# Patient Record
Sex: Female | Born: 1965
Health system: Southern US, Community
[De-identification: ages and names within clinical notes are randomized; demographics above are authoritative.]

## PROBLEM LIST (undated history)

## (undated) DIAGNOSIS — J189 Pneumonia, unspecified organism: Secondary | ICD-10-CM

## (undated) DIAGNOSIS — F32A Depression, unspecified: Secondary | ICD-10-CM

## (undated) DIAGNOSIS — F419 Anxiety disorder, unspecified: Secondary | ICD-10-CM

## (undated) DIAGNOSIS — I509 Heart failure, unspecified: Secondary | ICD-10-CM

## (undated) DIAGNOSIS — I428 Other cardiomyopathies: Secondary | ICD-10-CM

## (undated) DIAGNOSIS — IMO0002 Reserved for concepts with insufficient information to code with codable children: Secondary | ICD-10-CM

## (undated) DIAGNOSIS — I674 Hypertensive encephalopathy: Secondary | ICD-10-CM

## (undated) DIAGNOSIS — Z87442 Personal history of urinary calculi: Secondary | ICD-10-CM

## (undated) DIAGNOSIS — M51369 Other intervertebral disc degeneration, lumbar region without mention of lumbar back pain or lower extremity pain: Secondary | ICD-10-CM

## (undated) DIAGNOSIS — M1990 Unspecified osteoarthritis, unspecified site: Secondary | ICD-10-CM

## (undated) DIAGNOSIS — R569 Unspecified convulsions: Secondary | ICD-10-CM

## (undated) DIAGNOSIS — M170 Bilateral primary osteoarthritis of knee: Secondary | ICD-10-CM

## (undated) DIAGNOSIS — R51 Headache: Secondary | ICD-10-CM

## (undated) DIAGNOSIS — M5136 Other intervertebral disc degeneration, lumbar region: Secondary | ICD-10-CM

## (undated) DIAGNOSIS — J9690 Respiratory failure, unspecified, unspecified whether with hypoxia or hypercapnia: Secondary | ICD-10-CM

## (undated) DIAGNOSIS — I10 Essential (primary) hypertension: Secondary | ICD-10-CM

## (undated) DIAGNOSIS — R0600 Dyspnea, unspecified: Secondary | ICD-10-CM

## (undated) DIAGNOSIS — F329 Major depressive disorder, single episode, unspecified: Secondary | ICD-10-CM

## (undated) DIAGNOSIS — M5126 Other intervertebral disc displacement, lumbar region: Secondary | ICD-10-CM

## (undated) DIAGNOSIS — R519 Headache, unspecified: Secondary | ICD-10-CM

## (undated) DIAGNOSIS — F319 Bipolar disorder, unspecified: Secondary | ICD-10-CM

## (undated) DIAGNOSIS — G8929 Other chronic pain: Secondary | ICD-10-CM

## (undated) DIAGNOSIS — M549 Dorsalgia, unspecified: Secondary | ICD-10-CM

## (undated) HISTORY — PX: TONSILLECTOMY: SUR1361

## (undated) HISTORY — PX: APPENDECTOMY: SHX54

## (undated) HISTORY — PX: JOINT REPLACEMENT: SHX530

## (undated) HISTORY — PX: ABDOMINAL HYSTERECTOMY: SHX81

## (undated) HISTORY — PX: CHOLECYSTECTOMY: SHX55

---

## 2009-07-12 ENCOUNTER — Encounter: Admission: RE | Admit: 2009-07-12 | Discharge: 2009-07-12 | Payer: Self-pay | Admitting: Neurosurgery

## 2009-12-29 ENCOUNTER — Ambulatory Visit: Payer: Self-pay | Admitting: Pulmonary Disease

## 2009-12-29 ENCOUNTER — Inpatient Hospital Stay (HOSPITAL_COMMUNITY): Admission: EM | Admit: 2009-12-29 | Discharge: 2010-01-07 | Payer: Self-pay | Admitting: Internal Medicine

## 2009-12-29 ENCOUNTER — Ambulatory Visit: Payer: Self-pay | Admitting: Cardiology

## 2009-12-30 ENCOUNTER — Encounter: Payer: Self-pay | Admitting: Internal Medicine

## 2009-12-30 ENCOUNTER — Encounter (INDEPENDENT_AMBULATORY_CARE_PROVIDER_SITE_OTHER): Payer: Self-pay | Admitting: Internal Medicine

## 2010-01-02 ENCOUNTER — Encounter: Payer: Self-pay | Admitting: Internal Medicine

## 2010-01-07 ENCOUNTER — Ambulatory Visit: Payer: Self-pay | Admitting: Psychiatry

## 2010-01-19 DIAGNOSIS — J811 Chronic pulmonary edema: Secondary | ICD-10-CM

## 2010-01-19 DIAGNOSIS — G936 Cerebral edema: Secondary | ICD-10-CM

## 2010-01-19 DIAGNOSIS — I428 Other cardiomyopathies: Secondary | ICD-10-CM | POA: Insufficient documentation

## 2010-01-19 DIAGNOSIS — N19 Unspecified kidney failure: Secondary | ICD-10-CM | POA: Insufficient documentation

## 2010-01-19 DIAGNOSIS — J9600 Acute respiratory failure, unspecified whether with hypoxia or hypercapnia: Secondary | ICD-10-CM

## 2010-01-19 HISTORY — DX: Chronic pulmonary edema: J81.1

## 2010-01-19 HISTORY — DX: Unspecified kidney failure: N19

## 2010-01-19 HISTORY — DX: Other cardiomyopathies: I42.8

## 2010-01-19 HISTORY — DX: Acute respiratory failure, unspecified whether with hypoxia or hypercapnia: J96.00

## 2010-01-19 HISTORY — DX: Cerebral edema: G93.6

## 2010-01-20 ENCOUNTER — Ambulatory Visit: Payer: Self-pay | Admitting: Internal Medicine

## 2010-01-23 ENCOUNTER — Telehealth: Payer: Self-pay | Admitting: Internal Medicine

## 2010-01-23 LAB — CONVERTED CEMR LAB
GFR calc non Af Amer: 63.56 mL/min (ref >60)
Sodium: 132 meq/L — ABNORMAL LOW (ref 135–145)

## 2010-02-06 ENCOUNTER — Telehealth: Payer: Self-pay | Admitting: Internal Medicine

## 2010-02-08 ENCOUNTER — Encounter: Payer: Self-pay | Admitting: Internal Medicine

## 2010-02-15 ENCOUNTER — Encounter: Payer: Self-pay | Admitting: Internal Medicine

## 2010-03-06 ENCOUNTER — Encounter: Payer: Self-pay | Admitting: Internal Medicine

## 2010-05-17 ENCOUNTER — Encounter: Payer: Self-pay | Admitting: Internal Medicine

## 2010-06-02 ENCOUNTER — Telehealth: Payer: Self-pay | Admitting: Internal Medicine

## 2010-06-05 ENCOUNTER — Telehealth: Payer: Self-pay | Admitting: Internal Medicine

## 2010-07-06 ENCOUNTER — Encounter (INDEPENDENT_AMBULATORY_CARE_PROVIDER_SITE_OTHER): Payer: Self-pay | Admitting: *Deleted

## 2010-11-21 NOTE — Progress Notes (Signed)
Summary: refill  Phone Note Refill Request Message from:  Patient on June 02, 2010 11:01 AM  Refills Requested: Medication #1:  COREG 3.125 MG TABS 1 half  tab 2 times per day. [facility]  [phone]  Initial call taken by: Judie Grieve,  June 02, 2010 11:01 AM    Prescriptions: COREG 3.125 MG TABS (CARVEDILOL) 1 half  tab 2 times per day  #30 x 6   Entered by:   Burnett Kanaris, CNA   Authorized by:   Sherrill Raring, MD, [facility]   Signed by:   Burnett Kanaris, CNA on 06/02/2010   Method used:   Electronically to        [facility] (retail)       [address]       [place], [place]  [address]       Ph: [phone]       Fax: [phone]   RxID:   [id number]

## 2010-11-21 NOTE — Miscellaneous (Signed)
Summary: [facility] Cardiac Referral Form  [facility] Cardiac Referral Form   Imported By: Roderic Ovens 03/10/2010 11:18:04  _____________________________________________________________________  External Attachment:    Type:   Image     Comment:   External Document

## 2010-11-21 NOTE — Assessment & Plan Note (Signed)
Summary: eph/jml   CC:  post hosp f/u.  History of Present Illness: patient is a 45 year old who was discharged from [facility] earlier this month. We initially saw her in consult for ST elevation in the anterior and lateral leads.  Trop was positive. She had been admitted with a drug overdose, was hypoxic, intubated. The patient had an echo that showed severe LV dysfunction, LVEF 15%.  She underwent urgent cardiac catheterization.  the coronary arteries were normal.  It was felt that her presentation was a form of Takotsubo's cardiomyopathy.  Echo before D/C showed near normalization of LVEF.  She was diuresed and set home on coreg and Lisinopril SInce d/c she denies chst pain, denies shortness of  breath.  Denies edema.  Problems Prior to Update: 1)  Cerebral Edema  (ICD-348.5) 2)  Renal Failure  (ICD-586) 3)  Pulmonary Edema  (ICD-514) 4)  Cardiomyopathy  (ICD-425.4) 5)  Respiratory Failure  (ICD-518.81)  Current Medications (verified): 1)  Roxicodone 15 Mg Tabs (Oxycodone Hcl) .... Take 1 By Mouth Up To Three Times A Day 2)  Cymbalta 60 Mg Cpep (Duloxetine Hcl) .... Take 1 By Mouth Two Times A Day 3)  Seroquel 200 Mg Tabs (Quetiapine Fumarate) .... Take 1 By Mouth At Bedtime 4)  Trazodone Hcl 150 Mg Tabs (Trazodone Hcl) .... Take 1 By Mouth At Bedtime 5)  Trileptal 300 Mg Tabs (Oxcarbazepine) .... Take 1 By Mouth Two Times A Day 6)  Alprazolam 1 Mg Tabs (Alprazolam) .... Take 1 By Mouth Three Times A Day 7)  Zestril 2.5 Mg Tabs (Lisinopril) .Marland Kitchen.. 1 Per Day 8)  Coreg 3.125 Mg Tabs (Carvedilol) .Marland Kitchen.. 1 Tab 2 Times Per Day  Allergies (verified): 1)  ! Morphine 2)  ! * Opana 3)  ! * Oxymorphone  Review of Systems       All systems reviewed.  Negative to the above problem  Vital Signs:  Patient profile:   45 year old female Height:      65 inches Weight:      239 pounds BMI:     39.92 Pulse rate:   76 / minute BP sitting:   94 / 56  (left arm) Cuff size:    large  Vitals Entered By: Stanton Kidney, EMT-P (January 20, 2010 12:43 PM)  Physical Exam  Additional Exam:  Patient is in NAD HEENT:  Normocephalic, atraumatic. EOMI, PERRLA.  Neck: JVP is normal. No thyromegaly. No bruits.  Lungs: clear to auscultation. No rales no wheezes.  Heart: Regular rate and rhythm. Normal S1, S2. No S3.   No significant murmurs. PMI not displaced.  Abdomen:  Supple, nontender. Normal bowel sounds. No masses. No hepatomegaly.  Extremities:   Good distal pulses throughout. No lower extremity edema.  Musculoskeletal :moving all extremities.  Neuro:   alert and oriented x3.    EKG  Procedure date:  01/20/2010  Findings:      NSR>  76 bpm.  T wave inveriosn V2, I, AVL  Impression & Recommendations:  Problem # 1:  CARDIOMYOPATHY (ICD-425.4) Patient with recent MI felt secondary to spasm (Takotsubo's CM).  Recovering well.  Probably inititated by hypoxia. I would keep her on the current regimen.  will check BMET.  F/u in august.  Problem # 2:  RESPIRATORY FAILURE (ICD-518.81) Resolved  Other Orders: EKG w/ Interpretation (93000) TLB-BMP (Basic Metabolic Panel-BMET) (80048-METABOL)  Patient Instructions: 1)  Your physician recommends that you return for lab work in: bmet today....we  will call you with results 2)  Your physician wants you to follow-up in: end of July  You will receive a reminder letter in the mail two months in advance. If you don't receive a letter, please call our office to schedule the follow-up appointment.

## 2010-11-21 NOTE — Miscellaneous (Signed)
Summary: [facility] Cardiac Physician Order/Treatment Plan  [facility] Cardiac Physician Order/Treatment Plan   Imported By: Roderic Ovens 02/27/2010 11:21:27  _____________________________________________________________________  External Attachment:    Type:   Image     Comment:   External Document

## 2010-11-21 NOTE — Progress Notes (Signed)
Summary: QUESTION ABOUT COREG  Phone Note Call from Patient Call back at [facility] Phone [phone]   Caller: Patient Summary of Call: PT HAVE QUESTION ABOUT HER COREG Initial call taken by: Judie Grieve,  June 05, 2010 9:59 AM  Follow-up for Phone Call        Called patient...she was not home .Marland KitchenMarland KitchenLM for call back. Follow-up by: Suzan Garibaldi RN  Additional Follow-up for Phone Call Additional follow up Details #1::        Called patient back...she states that she can't cut the Coreg 3.125mg  in half because the tablet crumbles even with a pill cutter. Advised her to just take the Coreg 3.12 mg 1 tab  every day which is what she has been doing the past few days and feels well. Additional Follow-up by: Suzan Garibaldi RN

## 2010-11-21 NOTE — Miscellaneous (Signed)
  Clinical Lists Changes  Medications: Changed medication from COREG 3.125 MG TABS (CARVEDILOL) 1 tab 2 times per day to COREG 3.125 MG TABS (CARVEDILOL) 1 half  tab 2 times per day

## 2010-11-21 NOTE — Progress Notes (Signed)
Summary: SOB/tired  Phone Note Call from Patient Call back at Home Phone [phone]   Caller: Patient Reason for Call: Talk to Nurse Summary of Call: started having SOB when walking, she thinks her meds are making her tired Initial call taken by: Migdalia Dk,  February 06, 2010 12:07 PM  Follow-up for Phone Call        02/05/10--pt states since starting lisinopril and coreg she becomes SOB when she walks uphill and she is tired all the time--advised to decrease exercise( no uphill) and continue to take meds and i will let dr Tenny Craw know when she is back in office to give her a call--nt Follow-up by: Ledon Snare, RN,  February 06, 2010 12:36 PM     Appended Document: SOB/tired Patient can cut coreg in 1/2.  Take 2 times per day. See if she can get into cardiac rehab.  She ruled in.  Had vasospasm.  Appended Document: SOB/tired Called patient...she is aware of above.

## 2010-11-21 NOTE — Letter (Signed)
Summary: Appointment - Missed  [facility], Main Office  [address]   [place], [place] [address]   Phone: [phone]  Fax: [phone]     July 06, 2010 MRN: [id number]   [facility] [address] [place], [place]  [address]   Dear Ms. Sanagustin,  Our records indicate you missed your appointment on                        with Dr.       .                                    It is very important that we reach you to reschedule this appointment. We look forward to participating in your health care needs. Please contact us at the number listed above at your earliest convenience to reschedule this appointment.     Sincerely,    Glass blower/designer

## 2010-11-21 NOTE — Consult Note (Signed)
Summary: [facility]  [facility]   Imported By: Marylou Mccoy 01/19/2010 17:24:03  _____________________________________________________________________  External Attachment:    Type:   Image     Comment:   External Document

## 2010-11-21 NOTE — Progress Notes (Signed)
Summary: Questions about medication  Phone Note Call from Patient Call back at Home Phone [phone]   Caller: Patient Summary of Call: Pt have questions about medication Initial call taken by: Judie Grieve,  January 23, 2010 1:57 PM  Follow-up for Phone Call        Called patient ...she is complaining of some fluid retention in hands and feet/ankles..she denies any SOB. Advised I will discuss with Dr.Kiora Hallberg and call her back. Follow-up by: Suzan Garibaldi RN  Additional Follow-up for Phone Call Additional follow up Details #1::        Not on any meds  to lead to swelling.  Started day before yesterday.  Drinks crystal light 2 (32 ounzes), milk, coke. No shortness of breath I told her to cut back on fluid to 1.5 l total.  Call at end of wk. Additional Follow-up by: Sherrill Raring, MD, [facility],  January 23, 2010 4:36 PM     Appended Document: Questions about medication [facility] for call back to check status.

## 2011-01-15 LAB — BASIC METABOLIC PANEL
BUN: 14 mg/dL (ref 6–23)
BUN: 17 mg/dL (ref 6–23)
BUN: 18 mg/dL (ref 6–23)
BUN: 20 mg/dL (ref 6–23)
BUN: 20 mg/dL (ref 6–23)
BUN: 21 mg/dL (ref 6–23)
BUN: 22 mg/dL (ref 6–23)
BUN: 27 mg/dL — ABNORMAL HIGH (ref 6–23)
CO2: 22 mEq/L (ref 19–32)
CO2: 22 mEq/L (ref 19–32)
CO2: 24 mEq/L (ref 19–32)
CO2: 24 mEq/L (ref 19–32)
CO2: 25 mEq/L (ref 19–32)
CO2: 26 mEq/L (ref 19–32)
CO2: 27 mEq/L (ref 19–32)
CO2: 27 mEq/L (ref 19–32)
CO2: 28 mEq/L (ref 19–32)
Calcium: 7.9 mg/dL — ABNORMAL LOW (ref 8.4–10.5)
Calcium: 7.9 mg/dL — ABNORMAL LOW (ref 8.4–10.5)
Calcium: 8.7 mg/dL (ref 8.4–10.5)
Calcium: 8.7 mg/dL (ref 8.4–10.5)
Calcium: 8.8 mg/dL (ref 8.4–10.5)
Calcium: 9 mg/dL (ref 8.4–10.5)
Chloride: 100 mEq/L (ref 96–112)
Chloride: 100 mEq/L (ref 96–112)
Chloride: 102 mEq/L (ref 96–112)
Chloride: 104 mEq/L (ref 96–112)
Chloride: 104 mEq/L (ref 96–112)
Chloride: 104 mEq/L (ref 96–112)
Chloride: 105 mEq/L (ref 96–112)
Chloride: 99 mEq/L (ref 96–112)
Creatinine, Ser: 0.83 mg/dL (ref 0.4–1.2)
Creatinine, Ser: 0.85 mg/dL (ref 0.4–1.2)
Creatinine, Ser: 0.89 mg/dL (ref 0.4–1.2)
Creatinine, Ser: 0.92 mg/dL (ref 0.4–1.2)
Creatinine, Ser: 1 mg/dL (ref 0.4–1.2)
Creatinine, Ser: 1.07 mg/dL (ref 0.4–1.2)
GFR calc Af Amer: 56 mL/min — ABNORMAL LOW (ref >60)
GFR calc Af Amer: >60 mL/min (ref >60)
GFR calc Af Amer: >60 mL/min (ref >60)
GFR calc Af Amer: >60 mL/min (ref >60)
GFR calc Af Amer: >60 mL/min (ref >60)
GFR calc non Af Amer: 55 mL/min — ABNORMAL LOW (ref >60)
GFR calc non Af Amer: >60 mL/min (ref >60)
GFR calc non Af Amer: >60 mL/min (ref >60)
Glucose, Bld: 106 mg/dL — ABNORMAL HIGH (ref 70–99)
Glucose, Bld: 120 mg/dL — ABNORMAL HIGH (ref 70–99)
Glucose, Bld: 134 mg/dL — ABNORMAL HIGH (ref 70–99)
Glucose, Bld: 159 mg/dL — ABNORMAL HIGH (ref 70–99)
Glucose, Bld: 226 mg/dL — ABNORMAL HIGH (ref 70–99)
Glucose, Bld: 99 mg/dL (ref 70–99)
Glucose, Bld: 99 mg/dL (ref 70–99)
Potassium: 3.5 mEq/L (ref 3.5–5.1)
Potassium: 3.5 mEq/L (ref 3.5–5.1)
Potassium: 3.5 mEq/L (ref 3.5–5.1)
Potassium: 3.6 mEq/L (ref 3.5–5.1)
Potassium: 3.6 mEq/L (ref 3.5–5.1)
Potassium: 3.6 mEq/L (ref 3.5–5.1)
Potassium: 3.7 mEq/L (ref 3.5–5.1)
Potassium: 4.6 mEq/L (ref 3.5–5.1)
Sodium: 133 mEq/L — ABNORMAL LOW (ref 135–145)
Sodium: 134 mEq/L — ABNORMAL LOW (ref 135–145)
Sodium: 139 mEq/L (ref 135–145)
Sodium: 139 mEq/L (ref 135–145)

## 2011-01-15 LAB — BLOOD GAS, ARTERIAL
Acid-Base Excess: 5.4 mmol/L — ABNORMAL HIGH (ref 0.0–2.0)
Acid-base deficit: 2.1 mmol/L — ABNORMAL HIGH (ref 0.0–2.0)
Bicarbonate: 20.6 mEq/L (ref 20.0–24.0)
Bicarbonate: 22.6 mEq/L (ref 20.0–24.0)
FIO2: 1 %
MECHVT: 350 mL
MECHVT: 350 mL
MECHVT: 350 mL
MECHVT: 350 mL
O2 Saturation: 79 %
PEEP: 5 cmH2O
PEEP: 5 cmH2O
Patient temperature: 98.6
Patient temperature: 98.6
Patient temperature: 99
RATE: 35 resp/min
RATE: 35 resp/min
RATE: 35 resp/min
TCO2: 21.6 mmol/L (ref 0–100)
TCO2: 23.9 mmol/L (ref 0–100)
TCO2: 26.6 mmol/L (ref 0–100)
pCO2 arterial: 36.7 mmHg (ref 35.0–45.0)
pCO2 arterial: 42 mmHg (ref 35.0–45.0)
pH, Arterial: 7.352 (ref 7.350–7.400)
pH, Arterial: 7.484 — ABNORMAL HIGH (ref 7.350–7.400)
pH, Arterial: 7.485 — ABNORMAL HIGH (ref 7.350–7.400)
pH, Arterial: 7.505 — ABNORMAL HIGH (ref 7.350–7.400)

## 2011-01-15 LAB — POCT I-STAT 3, ART BLOOD GAS (G3+)
Acid-Base Excess: 4 mmol/L — ABNORMAL HIGH (ref 0.0–2.0)
O2 Saturation: 97 %
TCO2: 32 mmol/L (ref 0–100)
pCO2 arterial: 51.4 mmHg — ABNORMAL HIGH (ref 35.0–45.0)
pH, Arterial: 7.38 (ref 7.350–7.400)
pO2, Arterial: 92 mmHg (ref 80.0–100.0)

## 2011-01-15 LAB — LEGIONELLA ANTIGEN, URINE: Legionella Antigen, Urine: NEGATIVE

## 2011-01-15 LAB — COMPREHENSIVE METABOLIC PANEL
AST: 27 U/L (ref 0–37)
Alkaline Phosphatase: 132 U/L — ABNORMAL HIGH (ref 39–117)
Calcium: 8.5 mg/dL (ref 8.4–10.5)
GFR calc Af Amer: >60 mL/min (ref >60)
GFR calc non Af Amer: 60 mL/min — ABNORMAL LOW (ref >60)
Glucose, Bld: 131 mg/dL — ABNORMAL HIGH (ref 70–99)
Total Bilirubin: 0.4 mg/dL (ref 0.3–1.2)

## 2011-01-15 LAB — CBC
HCT: 29.3 % — ABNORMAL LOW (ref 36.0–46.0)
HCT: 30 % — ABNORMAL LOW (ref 36.0–46.0)
HCT: 33.8 % — ABNORMAL LOW (ref 36.0–46.0)
HCT: 34.3 % — ABNORMAL LOW (ref 36.0–46.0)
HCT: 35 % — ABNORMAL LOW (ref 36.0–46.0)
HCT: 35.1 % — ABNORMAL LOW (ref 36.0–46.0)
HCT: 40.6 % (ref 36.0–46.0)
Hemoglobin: 10.1 g/dL — ABNORMAL LOW (ref 12.0–15.0)
Hemoglobin: 10.9 g/dL — ABNORMAL LOW (ref 12.0–15.0)
Hemoglobin: 11.9 g/dL — ABNORMAL LOW (ref 12.0–15.0)
Hemoglobin: 12.1 g/dL (ref 12.0–15.0)
Hemoglobin: 9 g/dL — ABNORMAL LOW (ref 12.0–15.0)
MCHC: 32.2 g/dL (ref 30.0–36.0)
MCHC: 34.2 g/dL (ref 30.0–36.0)
MCHC: 34.3 g/dL (ref 30.0–36.0)
MCHC: 34.5 g/dL (ref 30.0–36.0)
MCHC: 34.5 g/dL (ref 30.0–36.0)
MCHC: 34.5 g/dL (ref 30.0–36.0)
MCHC: 34.7 g/dL (ref 30.0–36.0)
MCHC: 34.9 g/dL (ref 30.0–36.0)
MCV: 88.5 fL (ref 78.0–100.0)
MCV: 89.2 fL (ref 78.0–100.0)
MCV: 89.3 fL (ref 78.0–100.0)
MCV: 89.4 fL (ref 78.0–100.0)
MCV: 89.6 fL (ref 78.0–100.0)
MCV: 89.7 fL (ref 78.0–100.0)
MCV: 89.8 fL (ref 78.0–100.0)
Platelets: 189 10*3/uL (ref 150–400)
Platelets: 203 10*3/uL (ref 150–400)
Platelets: 338 10*3/uL (ref 150–400)
Platelets: 352 10*3/uL (ref 150–400)
Platelets: 390 10*3/uL (ref 150–400)
Platelets: 395 10*3/uL (ref 150–400)
RBC: 2.68 MIL/uL — ABNORMAL LOW (ref 3.87–5.11)
RBC: 2.93 MIL/uL — ABNORMAL LOW (ref 3.87–5.11)
RBC: 2.96 MIL/uL — ABNORMAL LOW (ref 3.87–5.11)
RBC: 3.27 MIL/uL — ABNORMAL LOW (ref 3.87–5.11)
RBC: 3.84 MIL/uL — ABNORMAL LOW (ref 3.87–5.11)
RDW: 12.1 % (ref 11.5–15.5)
RDW: 12.4 % (ref 11.5–15.5)
RDW: 12.6 % (ref 11.5–15.5)
RDW: 12.6 % (ref 11.5–15.5)
RDW: 12.6 % (ref 11.5–15.5)
RDW: 12.7 % (ref 11.5–15.5)
RDW: 12.8 % (ref 11.5–15.5)
RDW: 12.8 % (ref 11.5–15.5)
WBC: 11.6 10*3/uL — ABNORMAL HIGH (ref 4.0–10.5)
WBC: 12.8 10*3/uL — ABNORMAL HIGH (ref 4.0–10.5)
WBC: 12.8 10*3/uL — ABNORMAL HIGH (ref 4.0–10.5)
WBC: 25.1 10*3/uL — ABNORMAL HIGH (ref 4.0–10.5)

## 2011-01-15 LAB — RAPID URINE DRUG SCREEN, HOSP PERFORMED
Benzodiazepines: POSITIVE — AB
Cocaine: NOT DETECTED
Tetrahydrocannabinol: NOT DETECTED

## 2011-01-15 LAB — DIFFERENTIAL
Basophils Absolute: 0 10*3/uL (ref 0.0–0.1)
Basophils Relative: 0 % (ref 0–1)
Basophils Relative: 0 % (ref 0–1)
Eosinophils Absolute: 0 10*3/uL (ref 0.0–0.7)
Eosinophils Absolute: 0.4 10*3/uL (ref 0.0–0.7)
Eosinophils Relative: 3 % (ref 0–5)
Lymphocytes Relative: 11 % — ABNORMAL LOW (ref 12–46)
Lymphs Abs: 1.1 10*3/uL (ref 0.7–4.0)
Lymphs Abs: 3.3 10*3/uL (ref 0.7–4.0)
Monocytes Relative: 9 % (ref 3–12)
Neutro Abs: 8.9 10*3/uL — ABNORMAL HIGH (ref 1.7–7.7)
Neutrophils Relative %: 62 % (ref 43–77)
Neutrophils Relative %: 86 % — ABNORMAL HIGH (ref 43–77)

## 2011-01-15 LAB — BRAIN NATRIURETIC PEPTIDE
Pro B Natriuretic peptide (BNP): 1489 pg/mL — ABNORMAL HIGH (ref 0.0–100.0)
Pro B Natriuretic peptide (BNP): 803 pg/mL — ABNORMAL HIGH (ref 0.0–100.0)

## 2011-01-15 LAB — URINALYSIS, MICROSCOPIC ONLY
Bilirubin Urine: NEGATIVE
Glucose, UA: 100 mg/dL — AB
Protein, ur: 30 mg/dL — AB
Urobilinogen, UA: 0.2 mg/dL (ref 0.0–1.0)

## 2011-01-15 LAB — GLUCOSE, CAPILLARY
Glucose-Capillary: 108 mg/dL — ABNORMAL HIGH (ref 70–99)
Glucose-Capillary: 119 mg/dL — ABNORMAL HIGH (ref 70–99)
Glucose-Capillary: 120 mg/dL — ABNORMAL HIGH (ref 70–99)
Glucose-Capillary: 121 mg/dL — ABNORMAL HIGH (ref 70–99)
Glucose-Capillary: 121 mg/dL — ABNORMAL HIGH (ref 70–99)
Glucose-Capillary: 124 mg/dL — ABNORMAL HIGH (ref 70–99)
Glucose-Capillary: 126 mg/dL — ABNORMAL HIGH (ref 70–99)
Glucose-Capillary: 129 mg/dL — ABNORMAL HIGH (ref 70–99)
Glucose-Capillary: 129 mg/dL — ABNORMAL HIGH (ref 70–99)
Glucose-Capillary: 130 mg/dL — ABNORMAL HIGH (ref 70–99)
Glucose-Capillary: 132 mg/dL — ABNORMAL HIGH (ref 70–99)
Glucose-Capillary: 148 mg/dL — ABNORMAL HIGH (ref 70–99)
Glucose-Capillary: 163 mg/dL — ABNORMAL HIGH (ref 70–99)

## 2011-01-15 LAB — URINE CULTURE
Colony Count: NO GROWTH
Culture: NO GROWTH

## 2011-01-15 LAB — CULTURE, BAL-QUANTITATIVE W GRAM STAIN

## 2011-01-15 LAB — PHOSPHORUS
Phosphorus: 2.2 mg/dL — ABNORMAL LOW (ref 2.3–4.6)
Phosphorus: 3.9 mg/dL (ref 2.3–4.6)

## 2011-01-15 LAB — LACTIC ACID, PLASMA
Lactic Acid, Venous: 0.9 mmol/L (ref 0.5–2.2)
Lactic Acid, Venous: 2.5 mmol/L — ABNORMAL HIGH (ref 0.5–2.2)

## 2011-01-15 LAB — CARDIAC PANEL(CRET KIN+CKTOT+MB+TROPI)
CK, MB: 11.1 ng/mL (ref 0.3–4.0)
CK, MB: 15.6 ng/mL (ref 0.3–4.0)
CK, MB: 8.5 ng/mL (ref 0.3–4.0)
Relative Index: 2.3 (ref 0.0–2.5)
Relative Index: 6.6 — ABNORMAL HIGH (ref 0.0–2.5)
Relative Index: 7.6 — ABNORMAL HIGH (ref 0.0–2.5)
Troponin I: 0.01 ng/mL (ref 0.00–0.06)
Troponin I: 1.64 ng/mL (ref 0.00–0.06)
Troponin I: 4.95 ng/mL (ref 0.00–0.06)

## 2011-01-15 LAB — MRSA PCR SCREENING
MRSA by PCR: NEGATIVE
MRSA by PCR: NEGATIVE

## 2011-01-15 LAB — HEPARIN LEVEL (UNFRACTIONATED)
Heparin Unfractionated: 0.23 IU/mL — ABNORMAL LOW (ref 0.30–0.70)
Heparin Unfractionated: 0.42 IU/mL (ref 0.30–0.70)
Heparin Unfractionated: 0.5 IU/mL (ref 0.30–0.70)

## 2011-01-15 LAB — PROTIME-INR
INR: 1.08 (ref 0.00–1.49)
Prothrombin Time: 13.9 seconds (ref 11.6–15.2)

## 2011-01-15 LAB — CULTURE, BLOOD (ROUTINE X 2)

## 2011-01-15 LAB — APTT: aPTT: 23 seconds — ABNORMAL LOW (ref 24–37)

## 2011-01-15 LAB — MAGNESIUM
Magnesium: 1.9 mg/dL (ref 1.5–2.5)
Magnesium: 2.8 mg/dL — ABNORMAL HIGH (ref 1.5–2.5)

## 2011-01-15 LAB — TSH: TSH: 0.647 u[IU]/mL (ref 0.350–4.500)

## 2011-01-15 LAB — CK TOTAL AND CKMB (NOT AT ARMC): Relative Index: 1.9 (ref 0.0–2.5)

## 2011-01-15 LAB — CARBOXYHEMOGLOBIN: Methemoglobin: 0.8 % (ref 0.0–1.5)

## 2011-01-15 LAB — STREP PNEUMONIAE URINARY ANTIGEN: Strep Pneumo Urinary Antigen: NEGATIVE

## 2011-11-08 DIAGNOSIS — J040 Acute laryngitis: Secondary | ICD-10-CM | POA: Diagnosis not present

## 2011-11-08 DIAGNOSIS — J209 Acute bronchitis, unspecified: Secondary | ICD-10-CM | POA: Diagnosis not present

## 2011-11-21 DIAGNOSIS — R5381 Other malaise: Secondary | ICD-10-CM | POA: Diagnosis not present

## 2011-11-21 DIAGNOSIS — F319 Bipolar disorder, unspecified: Secondary | ICD-10-CM | POA: Diagnosis not present

## 2011-11-22 DIAGNOSIS — F4310 Post-traumatic stress disorder, unspecified: Secondary | ICD-10-CM | POA: Diagnosis present

## 2011-11-22 DIAGNOSIS — I509 Heart failure, unspecified: Secondary | ICD-10-CM | POA: Diagnosis not present

## 2011-11-22 DIAGNOSIS — R45851 Suicidal ideations: Secondary | ICD-10-CM | POA: Diagnosis not present

## 2011-11-22 DIAGNOSIS — F3160 Bipolar disorder, current episode mixed, unspecified: Secondary | ICD-10-CM | POA: Diagnosis not present

## 2011-11-22 DIAGNOSIS — M5137 Other intervertebral disc degeneration, lumbosacral region: Secondary | ICD-10-CM | POA: Diagnosis not present

## 2011-11-22 DIAGNOSIS — IMO0002 Reserved for concepts with insufficient information to code with codable children: Secondary | ICD-10-CM | POA: Diagnosis not present

## 2011-11-22 DIAGNOSIS — Z8673 Personal history of transient ischemic attack (TIA), and cerebral infarction without residual deficits: Secondary | ICD-10-CM | POA: Diagnosis not present

## 2011-11-22 DIAGNOSIS — F319 Bipolar disorder, unspecified: Secondary | ICD-10-CM | POA: Diagnosis not present

## 2011-11-22 DIAGNOSIS — F411 Generalized anxiety disorder: Secondary | ICD-10-CM | POA: Diagnosis not present

## 2011-11-22 DIAGNOSIS — M1990 Unspecified osteoarthritis, unspecified site: Secondary | ICD-10-CM | POA: Diagnosis present

## 2011-11-22 DIAGNOSIS — F3189 Other bipolar disorder: Secondary | ICD-10-CM | POA: Diagnosis present

## 2011-11-22 DIAGNOSIS — I10 Essential (primary) hypertension: Secondary | ICD-10-CM | POA: Diagnosis not present

## 2011-12-20 DIAGNOSIS — M5030 Other cervical disc degeneration, unspecified cervical region: Secondary | ICD-10-CM | POA: Diagnosis not present

## 2011-12-20 DIAGNOSIS — M5137 Other intervertebral disc degeneration, lumbosacral region: Secondary | ICD-10-CM | POA: Diagnosis not present

## 2012-01-24 DIAGNOSIS — F3164 Bipolar disorder, current episode mixed, severe, with psychotic features: Secondary | ICD-10-CM | POA: Diagnosis not present

## 2012-02-20 DIAGNOSIS — D649 Anemia, unspecified: Secondary | ICD-10-CM | POA: Diagnosis not present

## 2012-02-20 DIAGNOSIS — E119 Type 2 diabetes mellitus without complications: Secondary | ICD-10-CM | POA: Diagnosis not present

## 2012-02-20 DIAGNOSIS — R5383 Other fatigue: Secondary | ICD-10-CM | POA: Diagnosis not present

## 2012-02-20 DIAGNOSIS — R5381 Other malaise: Secondary | ICD-10-CM | POA: Diagnosis not present

## 2012-02-20 DIAGNOSIS — I509 Heart failure, unspecified: Secondary | ICD-10-CM | POA: Diagnosis not present

## 2012-03-10 DIAGNOSIS — G894 Chronic pain syndrome: Secondary | ICD-10-CM | POA: Diagnosis not present

## 2012-03-10 DIAGNOSIS — M5137 Other intervertebral disc degeneration, lumbosacral region: Secondary | ICD-10-CM | POA: Diagnosis not present

## 2012-03-26 DIAGNOSIS — M5137 Other intervertebral disc degeneration, lumbosacral region: Secondary | ICD-10-CM | POA: Diagnosis not present

## 2012-03-26 DIAGNOSIS — M25519 Pain in unspecified shoulder: Secondary | ICD-10-CM | POA: Diagnosis not present

## 2012-04-02 DIAGNOSIS — Z79899 Other long term (current) drug therapy: Secondary | ICD-10-CM | POA: Diagnosis not present

## 2012-04-02 DIAGNOSIS — J209 Acute bronchitis, unspecified: Secondary | ICD-10-CM | POA: Diagnosis not present

## 2012-04-02 DIAGNOSIS — I10 Essential (primary) hypertension: Secondary | ICD-10-CM | POA: Diagnosis not present

## 2012-04-03 DIAGNOSIS — F3164 Bipolar disorder, current episode mixed, severe, with psychotic features: Secondary | ICD-10-CM | POA: Diagnosis not present

## 2012-04-09 DIAGNOSIS — M5137 Other intervertebral disc degeneration, lumbosacral region: Secondary | ICD-10-CM | POA: Diagnosis not present

## 2012-04-09 DIAGNOSIS — IMO0002 Reserved for concepts with insufficient information to code with codable children: Secondary | ICD-10-CM | POA: Diagnosis not present

## 2012-04-15 DIAGNOSIS — R05 Cough: Secondary | ICD-10-CM | POA: Diagnosis not present

## 2012-04-15 DIAGNOSIS — R059 Cough, unspecified: Secondary | ICD-10-CM | POA: Diagnosis not present

## 2012-04-15 DIAGNOSIS — M25519 Pain in unspecified shoulder: Secondary | ICD-10-CM | POA: Diagnosis not present

## 2012-04-15 DIAGNOSIS — R5383 Other fatigue: Secondary | ICD-10-CM | POA: Diagnosis not present

## 2012-05-08 DIAGNOSIS — F603 Borderline personality disorder: Secondary | ICD-10-CM | POA: Diagnosis not present

## 2012-05-08 DIAGNOSIS — I10 Essential (primary) hypertension: Secondary | ICD-10-CM | POA: Diagnosis not present

## 2012-05-08 DIAGNOSIS — IMO0002 Reserved for concepts with insufficient information to code with codable children: Secondary | ICD-10-CM | POA: Diagnosis not present

## 2012-05-08 DIAGNOSIS — R45851 Suicidal ideations: Secondary | ICD-10-CM | POA: Diagnosis not present

## 2012-05-08 DIAGNOSIS — F329 Major depressive disorder, single episode, unspecified: Secondary | ICD-10-CM | POA: Diagnosis not present

## 2012-05-08 DIAGNOSIS — F3189 Other bipolar disorder: Secondary | ICD-10-CM | POA: Diagnosis not present

## 2012-05-08 DIAGNOSIS — F3289 Other specified depressive episodes: Secondary | ICD-10-CM | POA: Diagnosis not present

## 2012-05-16 DIAGNOSIS — F3189 Other bipolar disorder: Secondary | ICD-10-CM | POA: Diagnosis not present

## 2012-05-19 DIAGNOSIS — F29 Unspecified psychosis not due to a substance or known physiological condition: Secondary | ICD-10-CM | POA: Diagnosis not present

## 2012-05-19 DIAGNOSIS — F329 Major depressive disorder, single episode, unspecified: Secondary | ICD-10-CM | POA: Diagnosis not present

## 2012-05-19 DIAGNOSIS — T424X4A Poisoning by benzodiazepines, undetermined, initial encounter: Secondary | ICD-10-CM | POA: Diagnosis not present

## 2012-05-19 DIAGNOSIS — R45851 Suicidal ideations: Secondary | ICD-10-CM | POA: Diagnosis not present

## 2012-05-19 DIAGNOSIS — F332 Major depressive disorder, recurrent severe without psychotic features: Secondary | ICD-10-CM | POA: Diagnosis not present

## 2012-05-23 DIAGNOSIS — M25569 Pain in unspecified knee: Secondary | ICD-10-CM | POA: Diagnosis not present

## 2012-05-23 DIAGNOSIS — M545 Low back pain: Secondary | ICD-10-CM | POA: Diagnosis not present

## 2012-05-23 DIAGNOSIS — M5137 Other intervertebral disc degeneration, lumbosacral region: Secondary | ICD-10-CM | POA: Diagnosis not present

## 2012-05-23 DIAGNOSIS — G894 Chronic pain syndrome: Secondary | ICD-10-CM | POA: Diagnosis not present

## 2012-05-23 DIAGNOSIS — Z5181 Encounter for therapeutic drug level monitoring: Secondary | ICD-10-CM | POA: Diagnosis not present

## 2012-05-23 DIAGNOSIS — Z79899 Other long term (current) drug therapy: Secondary | ICD-10-CM | POA: Diagnosis not present

## 2012-05-27 DIAGNOSIS — R05 Cough: Secondary | ICD-10-CM | POA: Diagnosis not present

## 2012-05-29 DIAGNOSIS — F3164 Bipolar disorder, current episode mixed, severe, with psychotic features: Secondary | ICD-10-CM | POA: Diagnosis not present

## 2012-06-02 DIAGNOSIS — R42 Dizziness and giddiness: Secondary | ICD-10-CM | POA: Diagnosis not present

## 2012-06-02 DIAGNOSIS — G8929 Other chronic pain: Secondary | ICD-10-CM | POA: Diagnosis not present

## 2012-06-02 DIAGNOSIS — J309 Allergic rhinitis, unspecified: Secondary | ICD-10-CM | POA: Diagnosis not present

## 2012-06-02 DIAGNOSIS — R233 Spontaneous ecchymoses: Secondary | ICD-10-CM | POA: Diagnosis not present

## 2012-07-06 DIAGNOSIS — A499 Bacterial infection, unspecified: Secondary | ICD-10-CM | POA: Diagnosis not present

## 2012-07-06 DIAGNOSIS — R109 Unspecified abdominal pain: Secondary | ICD-10-CM | POA: Diagnosis not present

## 2012-07-06 DIAGNOSIS — I509 Heart failure, unspecified: Secondary | ICD-10-CM | POA: Diagnosis not present

## 2012-07-06 DIAGNOSIS — N390 Urinary tract infection, site not specified: Secondary | ICD-10-CM | POA: Diagnosis not present

## 2012-07-06 DIAGNOSIS — R300 Dysuria: Secondary | ICD-10-CM | POA: Diagnosis not present

## 2012-07-06 DIAGNOSIS — I10 Essential (primary) hypertension: Secondary | ICD-10-CM | POA: Diagnosis not present

## 2012-07-14 DIAGNOSIS — J029 Acute pharyngitis, unspecified: Secondary | ICD-10-CM | POA: Diagnosis not present

## 2012-07-14 DIAGNOSIS — R51 Headache: Secondary | ICD-10-CM | POA: Diagnosis not present

## 2012-07-14 DIAGNOSIS — R220 Localized swelling, mass and lump, head: Secondary | ICD-10-CM | POA: Diagnosis not present

## 2012-07-17 DIAGNOSIS — R51 Headache: Secondary | ICD-10-CM | POA: Diagnosis not present

## 2012-07-17 DIAGNOSIS — K115 Sialolithiasis: Secondary | ICD-10-CM | POA: Diagnosis not present

## 2012-07-23 DIAGNOSIS — M5126 Other intervertebral disc displacement, lumbar region: Secondary | ICD-10-CM | POA: Diagnosis not present

## 2012-07-23 DIAGNOSIS — M47817 Spondylosis without myelopathy or radiculopathy, lumbosacral region: Secondary | ICD-10-CM | POA: Diagnosis not present

## 2012-07-23 DIAGNOSIS — G8929 Other chronic pain: Secondary | ICD-10-CM | POA: Diagnosis not present

## 2012-07-23 DIAGNOSIS — IMO0002 Reserved for concepts with insufficient information to code with codable children: Secondary | ICD-10-CM | POA: Diagnosis not present

## 2012-07-23 DIAGNOSIS — Z79899 Other long term (current) drug therapy: Secondary | ICD-10-CM | POA: Diagnosis not present

## 2012-07-23 DIAGNOSIS — M5137 Other intervertebral disc degeneration, lumbosacral region: Secondary | ICD-10-CM | POA: Diagnosis not present

## 2012-07-31 DIAGNOSIS — M47817 Spondylosis without myelopathy or radiculopathy, lumbosacral region: Secondary | ICD-10-CM | POA: Diagnosis not present

## 2012-07-31 DIAGNOSIS — M5137 Other intervertebral disc degeneration, lumbosacral region: Secondary | ICD-10-CM | POA: Diagnosis not present

## 2012-07-31 DIAGNOSIS — M545 Low back pain, unspecified: Secondary | ICD-10-CM | POA: Diagnosis not present

## 2012-07-31 DIAGNOSIS — IMO0002 Reserved for concepts with insufficient information to code with codable children: Secondary | ICD-10-CM | POA: Diagnosis not present

## 2012-08-05 DIAGNOSIS — M5137 Other intervertebral disc degeneration, lumbosacral region: Secondary | ICD-10-CM | POA: Diagnosis not present

## 2012-08-05 DIAGNOSIS — IMO0002 Reserved for concepts with insufficient information to code with codable children: Secondary | ICD-10-CM | POA: Diagnosis not present

## 2012-08-05 DIAGNOSIS — Z79899 Other long term (current) drug therapy: Secondary | ICD-10-CM | POA: Diagnosis not present

## 2012-08-05 DIAGNOSIS — M47817 Spondylosis without myelopathy or radiculopathy, lumbosacral region: Secondary | ICD-10-CM | POA: Diagnosis not present

## 2012-08-05 DIAGNOSIS — G894 Chronic pain syndrome: Secondary | ICD-10-CM | POA: Diagnosis not present

## 2012-08-14 DIAGNOSIS — F3164 Bipolar disorder, current episode mixed, severe, with psychotic features: Secondary | ICD-10-CM | POA: Diagnosis not present

## 2012-08-19 DIAGNOSIS — M47817 Spondylosis without myelopathy or radiculopathy, lumbosacral region: Secondary | ICD-10-CM | POA: Diagnosis not present

## 2012-08-19 DIAGNOSIS — G894 Chronic pain syndrome: Secondary | ICD-10-CM | POA: Diagnosis not present

## 2012-08-19 DIAGNOSIS — M5137 Other intervertebral disc degeneration, lumbosacral region: Secondary | ICD-10-CM | POA: Diagnosis not present

## 2012-08-19 DIAGNOSIS — IMO0002 Reserved for concepts with insufficient information to code with codable children: Secondary | ICD-10-CM | POA: Diagnosis not present

## 2012-08-19 DIAGNOSIS — Z79899 Other long term (current) drug therapy: Secondary | ICD-10-CM | POA: Diagnosis not present

## 2012-08-21 DIAGNOSIS — IMO0002 Reserved for concepts with insufficient information to code with codable children: Secondary | ICD-10-CM | POA: Diagnosis not present

## 2012-08-21 DIAGNOSIS — M79609 Pain in unspecified limb: Secondary | ICD-10-CM | POA: Diagnosis not present

## 2012-09-02 DIAGNOSIS — IMO0002 Reserved for concepts with insufficient information to code with codable children: Secondary | ICD-10-CM | POA: Diagnosis not present

## 2012-09-02 DIAGNOSIS — M5137 Other intervertebral disc degeneration, lumbosacral region: Secondary | ICD-10-CM | POA: Diagnosis not present

## 2012-09-15 DIAGNOSIS — R7402 Elevation of levels of lactic acid dehydrogenase (LDH): Secondary | ICD-10-CM | POA: Diagnosis not present

## 2012-09-15 DIAGNOSIS — R7401 Elevation of levels of liver transaminase levels: Secondary | ICD-10-CM | POA: Diagnosis not present

## 2012-09-15 DIAGNOSIS — R109 Unspecified abdominal pain: Secondary | ICD-10-CM | POA: Diagnosis not present

## 2012-09-15 DIAGNOSIS — R7301 Impaired fasting glucose: Secondary | ICD-10-CM | POA: Diagnosis not present

## 2012-09-15 DIAGNOSIS — R197 Diarrhea, unspecified: Secondary | ICD-10-CM | POA: Diagnosis not present

## 2012-09-17 DIAGNOSIS — G894 Chronic pain syndrome: Secondary | ICD-10-CM | POA: Diagnosis not present

## 2012-09-17 DIAGNOSIS — Z79899 Other long term (current) drug therapy: Secondary | ICD-10-CM | POA: Diagnosis not present

## 2012-09-17 DIAGNOSIS — IMO0002 Reserved for concepts with insufficient information to code with codable children: Secondary | ICD-10-CM | POA: Diagnosis not present

## 2012-09-17 DIAGNOSIS — M5137 Other intervertebral disc degeneration, lumbosacral region: Secondary | ICD-10-CM | POA: Diagnosis not present

## 2012-09-23 DIAGNOSIS — R109 Unspecified abdominal pain: Secondary | ICD-10-CM | POA: Diagnosis not present

## 2012-09-23 DIAGNOSIS — R1032 Left lower quadrant pain: Secondary | ICD-10-CM | POA: Diagnosis not present

## 2012-09-23 DIAGNOSIS — R635 Abnormal weight gain: Secondary | ICD-10-CM | POA: Diagnosis not present

## 2012-09-25 DIAGNOSIS — G894 Chronic pain syndrome: Secondary | ICD-10-CM | POA: Diagnosis not present

## 2012-09-25 DIAGNOSIS — Z79899 Other long term (current) drug therapy: Secondary | ICD-10-CM | POA: Diagnosis not present

## 2012-09-25 DIAGNOSIS — IMO0002 Reserved for concepts with insufficient information to code with codable children: Secondary | ICD-10-CM | POA: Diagnosis not present

## 2012-09-25 DIAGNOSIS — M5137 Other intervertebral disc degeneration, lumbosacral region: Secondary | ICD-10-CM | POA: Diagnosis not present

## 2012-09-30 DIAGNOSIS — Z79899 Other long term (current) drug therapy: Secondary | ICD-10-CM | POA: Diagnosis not present

## 2012-09-30 DIAGNOSIS — M5137 Other intervertebral disc degeneration, lumbosacral region: Secondary | ICD-10-CM | POA: Diagnosis not present

## 2012-10-08 DIAGNOSIS — Z79899 Other long term (current) drug therapy: Secondary | ICD-10-CM | POA: Diagnosis not present

## 2012-10-08 DIAGNOSIS — Z5181 Encounter for therapeutic drug level monitoring: Secondary | ICD-10-CM | POA: Diagnosis not present

## 2012-10-08 DIAGNOSIS — F315 Bipolar disorder, current episode depressed, severe, with psychotic features: Secondary | ICD-10-CM | POA: Diagnosis not present

## 2012-10-08 DIAGNOSIS — F3164 Bipolar disorder, current episode mixed, severe, with psychotic features: Secondary | ICD-10-CM | POA: Diagnosis not present

## 2012-10-08 DIAGNOSIS — F3160 Bipolar disorder, current episode mixed, unspecified: Secondary | ICD-10-CM | POA: Diagnosis not present

## 2012-10-08 DIAGNOSIS — Z1339 Encounter for screening examination for other mental health and behavioral disorders: Secondary | ICD-10-CM | POA: Diagnosis not present

## 2012-10-23 DIAGNOSIS — M1990 Unspecified osteoarthritis, unspecified site: Secondary | ICD-10-CM | POA: Diagnosis not present

## 2012-10-23 DIAGNOSIS — G894 Chronic pain syndrome: Secondary | ICD-10-CM | POA: Diagnosis not present

## 2012-10-23 DIAGNOSIS — Z79899 Other long term (current) drug therapy: Secondary | ICD-10-CM | POA: Diagnosis not present

## 2012-10-23 DIAGNOSIS — M5137 Other intervertebral disc degeneration, lumbosacral region: Secondary | ICD-10-CM | POA: Diagnosis not present

## 2012-10-30 DIAGNOSIS — M5137 Other intervertebral disc degeneration, lumbosacral region: Secondary | ICD-10-CM | POA: Diagnosis not present

## 2012-11-05 DIAGNOSIS — M5137 Other intervertebral disc degeneration, lumbosacral region: Secondary | ICD-10-CM | POA: Diagnosis not present

## 2012-11-05 DIAGNOSIS — G894 Chronic pain syndrome: Secondary | ICD-10-CM | POA: Diagnosis not present

## 2012-11-05 DIAGNOSIS — Z79899 Other long term (current) drug therapy: Secondary | ICD-10-CM | POA: Diagnosis not present

## 2012-11-05 DIAGNOSIS — IMO0001 Reserved for inherently not codable concepts without codable children: Secondary | ICD-10-CM | POA: Diagnosis not present

## 2012-11-27 DIAGNOSIS — M5137 Other intervertebral disc degeneration, lumbosacral region: Secondary | ICD-10-CM | POA: Diagnosis not present

## 2012-12-02 DIAGNOSIS — Z79899 Other long term (current) drug therapy: Secondary | ICD-10-CM | POA: Diagnosis not present

## 2012-12-02 DIAGNOSIS — I509 Heart failure, unspecified: Secondary | ICD-10-CM | POA: Diagnosis not present

## 2012-12-02 DIAGNOSIS — R404 Transient alteration of awareness: Secondary | ICD-10-CM | POA: Diagnosis not present

## 2012-12-02 DIAGNOSIS — T40601A Poisoning by unspecified narcotics, accidental (unintentional), initial encounter: Secondary | ICD-10-CM | POA: Diagnosis not present

## 2012-12-02 DIAGNOSIS — G894 Chronic pain syndrome: Secondary | ICD-10-CM | POA: Diagnosis not present

## 2012-12-02 DIAGNOSIS — IMO0002 Reserved for concepts with insufficient information to code with codable children: Secondary | ICD-10-CM | POA: Diagnosis not present

## 2012-12-02 DIAGNOSIS — I10 Essential (primary) hypertension: Secondary | ICD-10-CM | POA: Diagnosis not present

## 2012-12-02 DIAGNOSIS — T400X1A Poisoning by opium, accidental (unintentional), initial encounter: Secondary | ICD-10-CM | POA: Diagnosis not present

## 2012-12-03 DIAGNOSIS — G894 Chronic pain syndrome: Secondary | ICD-10-CM | POA: Diagnosis not present

## 2012-12-03 DIAGNOSIS — Z79899 Other long term (current) drug therapy: Secondary | ICD-10-CM | POA: Diagnosis not present

## 2012-12-03 DIAGNOSIS — M5137 Other intervertebral disc degeneration, lumbosacral region: Secondary | ICD-10-CM | POA: Diagnosis not present

## 2012-12-03 DIAGNOSIS — M47817 Spondylosis without myelopathy or radiculopathy, lumbosacral region: Secondary | ICD-10-CM | POA: Diagnosis not present

## 2012-12-05 DIAGNOSIS — I509 Heart failure, unspecified: Secondary | ICD-10-CM | POA: Diagnosis not present

## 2012-12-05 DIAGNOSIS — K219 Gastro-esophageal reflux disease without esophagitis: Secondary | ICD-10-CM | POA: Diagnosis not present

## 2012-12-05 DIAGNOSIS — R0602 Shortness of breath: Secondary | ICD-10-CM | POA: Diagnosis not present

## 2012-12-05 DIAGNOSIS — R0789 Other chest pain: Secondary | ICD-10-CM | POA: Diagnosis not present

## 2012-12-05 DIAGNOSIS — I10 Essential (primary) hypertension: Secondary | ICD-10-CM | POA: Diagnosis not present

## 2012-12-05 DIAGNOSIS — R072 Precordial pain: Secondary | ICD-10-CM | POA: Diagnosis not present

## 2012-12-17 DIAGNOSIS — F3164 Bipolar disorder, current episode mixed, severe, with psychotic features: Secondary | ICD-10-CM | POA: Diagnosis not present

## 2012-12-31 DIAGNOSIS — K219 Gastro-esophageal reflux disease without esophagitis: Secondary | ICD-10-CM | POA: Diagnosis not present

## 2012-12-31 DIAGNOSIS — X58XXXA Exposure to other specified factors, initial encounter: Secondary | ICD-10-CM | POA: Diagnosis not present

## 2012-12-31 DIAGNOSIS — I10 Essential (primary) hypertension: Secondary | ICD-10-CM | POA: Diagnosis not present

## 2012-12-31 DIAGNOSIS — R109 Unspecified abdominal pain: Secondary | ICD-10-CM | POA: Diagnosis not present

## 2012-12-31 DIAGNOSIS — S2249XA Multiple fractures of ribs, unspecified side, initial encounter for closed fracture: Secondary | ICD-10-CM | POA: Diagnosis not present

## 2012-12-31 DIAGNOSIS — R112 Nausea with vomiting, unspecified: Secondary | ICD-10-CM | POA: Diagnosis not present

## 2012-12-31 DIAGNOSIS — R079 Chest pain, unspecified: Secondary | ICD-10-CM | POA: Diagnosis not present

## 2012-12-31 DIAGNOSIS — I509 Heart failure, unspecified: Secondary | ICD-10-CM | POA: Diagnosis not present

## 2013-01-01 DIAGNOSIS — Z79899 Other long term (current) drug therapy: Secondary | ICD-10-CM | POA: Diagnosis not present

## 2013-01-01 DIAGNOSIS — M5137 Other intervertebral disc degeneration, lumbosacral region: Secondary | ICD-10-CM | POA: Diagnosis not present

## 2013-01-01 DIAGNOSIS — G894 Chronic pain syndrome: Secondary | ICD-10-CM | POA: Diagnosis not present

## 2013-01-01 DIAGNOSIS — M47817 Spondylosis without myelopathy or radiculopathy, lumbosacral region: Secondary | ICD-10-CM | POA: Diagnosis not present

## 2013-01-08 DIAGNOSIS — I10 Essential (primary) hypertension: Secondary | ICD-10-CM | POA: Diagnosis not present

## 2013-01-08 DIAGNOSIS — J398 Other specified diseases of upper respiratory tract: Secondary | ICD-10-CM | POA: Diagnosis not present

## 2013-01-08 DIAGNOSIS — Z006 Encounter for examination for normal comparison and control in clinical research program: Secondary | ICD-10-CM | POA: Diagnosis not present

## 2013-01-08 DIAGNOSIS — J029 Acute pharyngitis, unspecified: Secondary | ICD-10-CM | POA: Diagnosis not present

## 2013-01-08 DIAGNOSIS — Z8781 Personal history of (healed) traumatic fracture: Secondary | ICD-10-CM | POA: Diagnosis not present

## 2013-01-08 DIAGNOSIS — R05 Cough: Secondary | ICD-10-CM | POA: Diagnosis not present

## 2013-01-16 DIAGNOSIS — G894 Chronic pain syndrome: Secondary | ICD-10-CM | POA: Diagnosis not present

## 2013-01-16 DIAGNOSIS — Z79899 Other long term (current) drug therapy: Secondary | ICD-10-CM | POA: Diagnosis not present

## 2013-01-16 DIAGNOSIS — M5137 Other intervertebral disc degeneration, lumbosacral region: Secondary | ICD-10-CM | POA: Diagnosis not present

## 2013-01-16 DIAGNOSIS — M79609 Pain in unspecified limb: Secondary | ICD-10-CM | POA: Diagnosis not present

## 2013-01-26 DIAGNOSIS — H66009 Acute suppurative otitis media without spontaneous rupture of ear drum, unspecified ear: Secondary | ICD-10-CM | POA: Diagnosis not present

## 2013-01-26 DIAGNOSIS — J019 Acute sinusitis, unspecified: Secondary | ICD-10-CM | POA: Diagnosis not present

## 2013-01-26 DIAGNOSIS — R05 Cough: Secondary | ICD-10-CM | POA: Diagnosis not present

## 2013-01-29 DIAGNOSIS — Z79899 Other long term (current) drug therapy: Secondary | ICD-10-CM | POA: Diagnosis not present

## 2013-01-29 DIAGNOSIS — M5137 Other intervertebral disc degeneration, lumbosacral region: Secondary | ICD-10-CM | POA: Diagnosis not present

## 2013-01-29 DIAGNOSIS — M79609 Pain in unspecified limb: Secondary | ICD-10-CM | POA: Diagnosis not present

## 2013-01-29 DIAGNOSIS — G894 Chronic pain syndrome: Secondary | ICD-10-CM | POA: Diagnosis not present

## 2013-02-18 DIAGNOSIS — R079 Chest pain, unspecified: Secondary | ICD-10-CM | POA: Diagnosis not present

## 2013-02-18 DIAGNOSIS — I10 Essential (primary) hypertension: Secondary | ICD-10-CM | POA: Diagnosis not present

## 2013-02-18 DIAGNOSIS — R071 Chest pain on breathing: Secondary | ICD-10-CM | POA: Diagnosis not present

## 2013-02-18 DIAGNOSIS — Z79899 Other long term (current) drug therapy: Secondary | ICD-10-CM | POA: Diagnosis not present

## 2013-02-18 DIAGNOSIS — Z87891 Personal history of nicotine dependence: Secondary | ICD-10-CM | POA: Diagnosis not present

## 2013-02-18 DIAGNOSIS — M60009 Infective myositis, unspecified site: Secondary | ICD-10-CM | POA: Diagnosis not present

## 2013-02-23 DIAGNOSIS — R079 Chest pain, unspecified: Secondary | ICD-10-CM | POA: Diagnosis not present

## 2013-02-23 DIAGNOSIS — I10 Essential (primary) hypertension: Secondary | ICD-10-CM | POA: Diagnosis not present

## 2013-02-23 DIAGNOSIS — Z87891 Personal history of nicotine dependence: Secondary | ICD-10-CM | POA: Diagnosis not present

## 2013-02-23 DIAGNOSIS — I509 Heart failure, unspecified: Secondary | ICD-10-CM | POA: Diagnosis not present

## 2013-02-23 DIAGNOSIS — Z87440 Personal history of urinary (tract) infections: Secondary | ICD-10-CM | POA: Diagnosis not present

## 2013-02-23 DIAGNOSIS — Z90710 Acquired absence of both cervix and uterus: Secondary | ICD-10-CM | POA: Diagnosis not present

## 2013-02-23 DIAGNOSIS — K219 Gastro-esophageal reflux disease without esophagitis: Secondary | ICD-10-CM | POA: Diagnosis not present

## 2013-02-23 DIAGNOSIS — T1490XA Injury, unspecified, initial encounter: Secondary | ICD-10-CM | POA: Diagnosis not present

## 2013-02-23 DIAGNOSIS — S298XXA Other specified injuries of thorax, initial encounter: Secondary | ICD-10-CM | POA: Diagnosis not present

## 2013-02-23 DIAGNOSIS — Z79899 Other long term (current) drug therapy: Secondary | ICD-10-CM | POA: Diagnosis not present

## 2013-02-26 DIAGNOSIS — M5137 Other intervertebral disc degeneration, lumbosacral region: Secondary | ICD-10-CM | POA: Diagnosis not present

## 2013-02-26 DIAGNOSIS — M5380 Other specified dorsopathies, site unspecified: Secondary | ICD-10-CM | POA: Diagnosis not present

## 2013-02-26 DIAGNOSIS — Z79899 Other long term (current) drug therapy: Secondary | ICD-10-CM | POA: Diagnosis not present

## 2013-02-26 DIAGNOSIS — G894 Chronic pain syndrome: Secondary | ICD-10-CM | POA: Diagnosis not present

## 2013-03-08 DIAGNOSIS — M545 Low back pain, unspecified: Secondary | ICD-10-CM | POA: Diagnosis not present

## 2013-03-08 DIAGNOSIS — R319 Hematuria, unspecified: Secondary | ICD-10-CM | POA: Diagnosis not present

## 2013-03-08 DIAGNOSIS — S2239XA Fracture of one rib, unspecified side, initial encounter for closed fracture: Secondary | ICD-10-CM | POA: Diagnosis not present

## 2013-03-08 DIAGNOSIS — R82998 Other abnormal findings in urine: Secondary | ICD-10-CM | POA: Diagnosis not present

## 2013-03-08 DIAGNOSIS — I10 Essential (primary) hypertension: Secondary | ICD-10-CM | POA: Diagnosis not present

## 2013-03-08 DIAGNOSIS — N390 Urinary tract infection, site not specified: Secondary | ICD-10-CM | POA: Diagnosis not present

## 2013-03-08 DIAGNOSIS — Z79899 Other long term (current) drug therapy: Secondary | ICD-10-CM | POA: Diagnosis not present

## 2013-03-10 DIAGNOSIS — M1610 Unilateral primary osteoarthritis, unspecified hip: Secondary | ICD-10-CM | POA: Diagnosis not present

## 2013-03-25 DIAGNOSIS — M5137 Other intervertebral disc degeneration, lumbosacral region: Secondary | ICD-10-CM | POA: Diagnosis not present

## 2013-03-25 DIAGNOSIS — G894 Chronic pain syndrome: Secondary | ICD-10-CM | POA: Diagnosis not present

## 2013-03-25 DIAGNOSIS — IMO0001 Reserved for inherently not codable concepts without codable children: Secondary | ICD-10-CM | POA: Diagnosis not present

## 2013-03-25 DIAGNOSIS — Z79899 Other long term (current) drug therapy: Secondary | ICD-10-CM | POA: Diagnosis not present

## 2013-03-25 DIAGNOSIS — M169 Osteoarthritis of hip, unspecified: Secondary | ICD-10-CM | POA: Diagnosis not present

## 2013-03-30 DIAGNOSIS — Z79899 Other long term (current) drug therapy: Secondary | ICD-10-CM | POA: Diagnosis not present

## 2013-03-30 DIAGNOSIS — G894 Chronic pain syndrome: Secondary | ICD-10-CM | POA: Diagnosis not present

## 2013-04-06 ENCOUNTER — Encounter (HOSPITAL_COMMUNITY): Payer: Self-pay | Admitting: Emergency Medicine

## 2013-04-06 ENCOUNTER — Emergency Department (HOSPITAL_COMMUNITY)
Admission: EM | Admit: 2013-04-06 | Discharge: 2013-04-06 | Disposition: A | Discharge status: Home / Self Care | Payer: Medicare Other | Attending: Emergency Medicine | Admitting: Emergency Medicine

## 2013-04-06 DIAGNOSIS — I509 Heart failure, unspecified: Secondary | ICD-10-CM | POA: Insufficient documentation

## 2013-04-06 DIAGNOSIS — F1123 Opioid dependence with withdrawal: Secondary | ICD-10-CM

## 2013-04-06 DIAGNOSIS — G894 Chronic pain syndrome: Secondary | ICD-10-CM | POA: Diagnosis not present

## 2013-04-06 DIAGNOSIS — I10 Essential (primary) hypertension: Secondary | ICD-10-CM | POA: Insufficient documentation

## 2013-04-06 DIAGNOSIS — M25569 Pain in unspecified knee: Secondary | ICD-10-CM | POA: Insufficient documentation

## 2013-04-06 DIAGNOSIS — M47817 Spondylosis without myelopathy or radiculopathy, lumbosacral region: Secondary | ICD-10-CM | POA: Diagnosis not present

## 2013-04-06 DIAGNOSIS — F329 Major depressive disorder, single episode, unspecified: Secondary | ICD-10-CM | POA: Insufficient documentation

## 2013-04-06 DIAGNOSIS — M129 Arthropathy, unspecified: Secondary | ICD-10-CM | POA: Insufficient documentation

## 2013-04-06 DIAGNOSIS — M25552 Pain in left hip: Secondary | ICD-10-CM

## 2013-04-06 DIAGNOSIS — Z79899 Other long term (current) drug therapy: Secondary | ICD-10-CM | POA: Diagnosis not present

## 2013-04-06 DIAGNOSIS — M5137 Other intervertebral disc degeneration, lumbosacral region: Secondary | ICD-10-CM | POA: Diagnosis not present

## 2013-04-06 DIAGNOSIS — F3289 Other specified depressive episodes: Secondary | ICD-10-CM | POA: Insufficient documentation

## 2013-04-06 DIAGNOSIS — F19939 Other psychoactive substance use, unspecified with withdrawal, unspecified: Secondary | ICD-10-CM | POA: Insufficient documentation

## 2013-04-06 DIAGNOSIS — G8921 Chronic pain due to trauma: Secondary | ICD-10-CM | POA: Insufficient documentation

## 2013-04-06 HISTORY — DX: Unspecified osteoarthritis, unspecified site: M19.90

## 2013-04-06 HISTORY — DX: Heart failure, unspecified: I50.9

## 2013-04-06 HISTORY — DX: Essential (primary) hypertension: I10

## 2013-04-06 LAB — RAPID URINE DRUG SCREEN, HOSP PERFORMED
Barbiturates: NOT DETECTED
Benzodiazepines: NOT DETECTED

## 2013-04-06 MED ORDER — OXYCODONE-ACETAMINOPHEN 5-325 MG PO TABS: 2.0000 | ORAL_TABLET | ORAL | Status: DC | PRN

## 2013-04-06 MED ORDER — HYDROMORPHONE HCL PF 2 MG/ML IJ SOLN
2.0000 mg | Freq: Once | INTRAMUSCULAR | Status: AC
  Administered 2013-04-06: 2 mg via INTRAMUSCULAR
  Filled 2013-04-06: qty 1

## 2013-04-06 NOTE — ED Notes (Signed)
Dr. Delo at bedside. 

## 2013-04-06 NOTE — ED Provider Notes (Signed)
History     CSN: [id number]  Arrival date & time 04/06/13  1026   First MD Initiated Contact with Patient 04/06/13 1152      Chief Complaint  Patient presents with  . Pain  . Medical Clearance    (Consider location/radiation/quality/duration/timing/severity/associated sxs/prior treatment) HPI Comments: Patient with history of chronic hip pain due a "fracture" that she is due to see Dr. Charlann Boxer this Wednesday.  She tells me she was found to have high levels of benzodiazepenes and cocaine in her drug screen this morning.  The provider, Shawnee Knapp, at the pain clinic told her it was illegal for her to prescribe her more medicine due to this.  The patient tells me she was sent here for mental health evaluation as well as for pain control.  She denies to me that she is suicidal, homicidal, but admits that she is "depressed" due to the pain from her hip.  She denies to me having ever used cocaine, but does admit taking extra benzos to try to make the pain in her hip go away.  She has also used up all of her narcotic pain medications she was given to last until Wednesday.  The history is provided by the patient.    Past Medical History  Diagnosis Date  . Hypertension   . CHF (congestive heart failure)   . Arthritis     Past Surgical History  Procedure Laterality Date  . Cholecystectomy    . Appendectomy    . Abdominal hysterectomy      History reviewed. No pertinent family history.  History  Substance Use Topics  . Smoking status: Never Smoker   . Smokeless tobacco: Not on file  . Alcohol Use: No    OB History   Grav Para Term Preterm Abortions TAB SAB Ect Mult Living                  Review of Systems  All other systems reviewed and are negative.    Allergies  Morphine and Oxymorphone  Home Medications  No current outpatient prescriptions on file.  BP 130/84  Pulse 84  Temp(Src) 98.6 F (37 C) (Oral)  Resp 18  SpO2 96%  Physical Exam  Nursing note and  vitals reviewed. Constitutional: She is oriented to person, place, and time. She appears well-developed and well-nourished. No distress.  HENT:  Head: Normocephalic and atraumatic.  Neck: Normal range of motion. Neck supple.  Cardiovascular: Normal rate and regular rhythm.  Exam reveals no gallop and no friction rub.   No murmur heard. Pulmonary/Chest: Effort normal and breath sounds normal. No respiratory distress. She has no wheezes.  Abdominal: Soft. Bowel sounds are normal. She exhibits no distension. There is no tenderness.  Musculoskeletal: Normal range of motion.  She walks with a limp but is able to ambulate to the bathroom without difficulty.  Neurological: She is alert and oriented to person, place, and time.  Skin: Skin is warm and dry. She is not diaphoretic.    ED Course  Procedures (including critical care time)  Labs Reviewed - No data to display No results found.   No diagnosis found.    MDM  The patient presents here with chronic hip pain.  Pain management has refused to prescribe her more narcotics due to a positive drug test for cocaine.  She denies having used cocaine and the drug screen here is negative.  There seems to be a discrepancy between the drug test here and at the  clinic, and she has displayed a pattern of coming to the ED with painful conditions.  I have decided to give her the benefit of the doubt and have agreed to prescribe her a limited number of percocet that she can take between now and her appointment with Dr. Charlann Boxer.  I have also informed her that I will not do this again in the future.    She denies to me that she is suicidal or homicidal.  I have offered, but she declines to speak with anyone from crisis management.         Geoffery Lyons, MD 04/06/13 1340
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
## 2013-04-06 NOTE — ED Notes (Signed)
Pt arrives with home healthworker after pt reportedly failed urine drug screen at pain management clinic this AM. Per pt she took extra of her home medicines over the weekend to control pain for her broken hip. Pt ambulatory with cane, denies SI/ HI. Cooperative with care.

## 2013-04-06 NOTE — ED Notes (Signed)
Discharge instructions reviewed. Pt verbalized understanding.  

## 2013-04-06 NOTE — ED Notes (Addendum)
Pt presents to ED per Dr. Charlann Boxer request due to taking more pain medication than normal. Pt states she tested positive for benzodiazepines, and cocaine. Pt denies being suicidal and states she took the pain medication and more xanax than usual because of worsening pain. MD at pain clinic refused to treat her pain due to being positive for cocaine and was sent here for evaluation by ACT team.

## 2013-04-08 DIAGNOSIS — M25559 Pain in unspecified hip: Secondary | ICD-10-CM | POA: Diagnosis not present

## 2013-04-14 DIAGNOSIS — M25559 Pain in unspecified hip: Secondary | ICD-10-CM | POA: Diagnosis not present

## 2013-04-14 DIAGNOSIS — M1610 Unilateral primary osteoarthritis, unspecified hip: Secondary | ICD-10-CM | POA: Diagnosis not present

## 2013-04-20 DIAGNOSIS — M25559 Pain in unspecified hip: Secondary | ICD-10-CM | POA: Diagnosis not present

## 2013-04-20 DIAGNOSIS — G8929 Other chronic pain: Secondary | ICD-10-CM | POA: Diagnosis not present

## 2013-04-29 DIAGNOSIS — M25559 Pain in unspecified hip: Secondary | ICD-10-CM | POA: Diagnosis not present

## 2013-05-01 DIAGNOSIS — Z1339 Encounter for screening examination for other mental health and behavioral disorders: Secondary | ICD-10-CM | POA: Diagnosis not present

## 2013-05-01 DIAGNOSIS — Z79899 Other long term (current) drug therapy: Secondary | ICD-10-CM | POA: Diagnosis not present

## 2013-05-01 DIAGNOSIS — F3164 Bipolar disorder, current episode mixed, severe, with psychotic features: Secondary | ICD-10-CM | POA: Diagnosis not present

## 2013-05-01 DIAGNOSIS — Z5181 Encounter for therapeutic drug level monitoring: Secondary | ICD-10-CM | POA: Diagnosis not present

## 2013-05-03 DIAGNOSIS — M25519 Pain in unspecified shoulder: Secondary | ICD-10-CM | POA: Diagnosis not present

## 2013-05-03 DIAGNOSIS — M719 Bursopathy, unspecified: Secondary | ICD-10-CM | POA: Diagnosis not present

## 2013-05-03 DIAGNOSIS — M67919 Unspecified disorder of synovium and tendon, unspecified shoulder: Secondary | ICD-10-CM | POA: Diagnosis not present

## 2013-05-03 DIAGNOSIS — M7530 Calcific tendinitis of unspecified shoulder: Secondary | ICD-10-CM | POA: Diagnosis not present

## 2013-05-04 DIAGNOSIS — K219 Gastro-esophageal reflux disease without esophagitis: Secondary | ICD-10-CM | POA: Diagnosis not present

## 2013-05-04 DIAGNOSIS — M129 Arthropathy, unspecified: Secondary | ICD-10-CM | POA: Diagnosis not present

## 2013-05-04 DIAGNOSIS — I10 Essential (primary) hypertension: Secondary | ICD-10-CM | POA: Diagnosis not present

## 2013-05-04 DIAGNOSIS — M25559 Pain in unspecified hip: Secondary | ICD-10-CM | POA: Diagnosis not present

## 2013-05-04 DIAGNOSIS — Z79899 Other long term (current) drug therapy: Secondary | ICD-10-CM | POA: Diagnosis not present

## 2013-05-04 DIAGNOSIS — M25519 Pain in unspecified shoulder: Secondary | ICD-10-CM | POA: Diagnosis not present

## 2013-05-04 DIAGNOSIS — N390 Urinary tract infection, site not specified: Secondary | ICD-10-CM | POA: Diagnosis not present

## 2013-05-04 DIAGNOSIS — E7800 Pure hypercholesterolemia, unspecified: Secondary | ICD-10-CM | POA: Diagnosis not present

## 2013-05-04 DIAGNOSIS — F319 Bipolar disorder, unspecified: Secondary | ICD-10-CM | POA: Diagnosis not present

## 2013-05-04 DIAGNOSIS — I509 Heart failure, unspecified: Secondary | ICD-10-CM | POA: Diagnosis not present

## 2013-05-05 DIAGNOSIS — G8929 Other chronic pain: Secondary | ICD-10-CM | POA: Diagnosis not present

## 2013-05-05 DIAGNOSIS — Z5181 Encounter for therapeutic drug level monitoring: Secondary | ICD-10-CM | POA: Diagnosis not present

## 2013-05-05 DIAGNOSIS — Z79899 Other long term (current) drug therapy: Secondary | ICD-10-CM | POA: Diagnosis not present

## 2013-05-05 DIAGNOSIS — I10 Essential (primary) hypertension: Secondary | ICD-10-CM | POA: Diagnosis not present

## 2013-05-06 DIAGNOSIS — M76899 Other specified enthesopathies of unspecified lower limb, excluding foot: Secondary | ICD-10-CM | POA: Diagnosis not present

## 2013-05-06 DIAGNOSIS — M25519 Pain in unspecified shoulder: Secondary | ICD-10-CM | POA: Diagnosis not present

## 2013-05-06 DIAGNOSIS — M25559 Pain in unspecified hip: Secondary | ICD-10-CM | POA: Diagnosis not present

## 2013-05-06 DIAGNOSIS — Z79899 Other long term (current) drug therapy: Secondary | ICD-10-CM | POA: Diagnosis not present

## 2013-05-18 DIAGNOSIS — M5126 Other intervertebral disc displacement, lumbar region: Secondary | ICD-10-CM | POA: Diagnosis not present

## 2013-05-18 DIAGNOSIS — M48061 Spinal stenosis, lumbar region without neurogenic claudication: Secondary | ICD-10-CM | POA: Diagnosis not present

## 2013-05-23 DIAGNOSIS — M48061 Spinal stenosis, lumbar region without neurogenic claudication: Secondary | ICD-10-CM | POA: Diagnosis not present

## 2013-05-27 DIAGNOSIS — G8929 Other chronic pain: Secondary | ICD-10-CM | POA: Diagnosis not present

## 2013-05-27 DIAGNOSIS — I10 Essential (primary) hypertension: Secondary | ICD-10-CM | POA: Diagnosis not present

## 2013-05-27 DIAGNOSIS — R5383 Other fatigue: Secondary | ICD-10-CM | POA: Diagnosis not present

## 2013-05-27 DIAGNOSIS — R5381 Other malaise: Secondary | ICD-10-CM | POA: Diagnosis not present

## 2013-05-27 DIAGNOSIS — E669 Obesity, unspecified: Secondary | ICD-10-CM | POA: Diagnosis not present

## 2013-06-06 DIAGNOSIS — Z8249 Family history of ischemic heart disease and other diseases of the circulatory system: Secondary | ICD-10-CM | POA: Diagnosis not present

## 2013-06-06 DIAGNOSIS — R072 Precordial pain: Secondary | ICD-10-CM | POA: Diagnosis not present

## 2013-06-06 DIAGNOSIS — I200 Unstable angina: Secondary | ICD-10-CM | POA: Diagnosis not present

## 2013-06-06 DIAGNOSIS — Z0389 Encounter for observation for other suspected diseases and conditions ruled out: Secondary | ICD-10-CM | POA: Diagnosis not present

## 2013-06-06 DIAGNOSIS — R002 Palpitations: Secondary | ICD-10-CM | POA: Diagnosis not present

## 2013-06-06 DIAGNOSIS — R0602 Shortness of breath: Secondary | ICD-10-CM | POA: Diagnosis not present

## 2013-06-06 DIAGNOSIS — Z8781 Personal history of (healed) traumatic fracture: Secondary | ICD-10-CM | POA: Diagnosis not present

## 2013-06-06 DIAGNOSIS — Z8674 Personal history of sudden cardiac arrest: Secondary | ICD-10-CM | POA: Diagnosis not present

## 2013-06-06 DIAGNOSIS — R0789 Other chest pain: Secondary | ICD-10-CM | POA: Diagnosis not present

## 2013-06-06 DIAGNOSIS — S2249XA Multiple fractures of ribs, unspecified side, initial encounter for closed fracture: Secondary | ICD-10-CM | POA: Diagnosis not present

## 2013-06-06 DIAGNOSIS — K219 Gastro-esophageal reflux disease without esophagitis: Secondary | ICD-10-CM | POA: Diagnosis not present

## 2013-06-06 DIAGNOSIS — M479 Spondylosis, unspecified: Secondary | ICD-10-CM | POA: Diagnosis not present

## 2013-06-07 DIAGNOSIS — M479 Spondylosis, unspecified: Secondary | ICD-10-CM | POA: Diagnosis not present

## 2013-06-07 DIAGNOSIS — R0789 Other chest pain: Secondary | ICD-10-CM | POA: Diagnosis not present

## 2013-06-07 DIAGNOSIS — K219 Gastro-esophageal reflux disease without esophagitis: Secondary | ICD-10-CM | POA: Diagnosis not present

## 2013-06-07 DIAGNOSIS — Z0389 Encounter for observation for other suspected diseases and conditions ruled out: Secondary | ICD-10-CM | POA: Diagnosis not present

## 2013-06-07 DIAGNOSIS — S2249XA Multiple fractures of ribs, unspecified side, initial encounter for closed fracture: Secondary | ICD-10-CM | POA: Diagnosis not present

## 2013-06-08 DIAGNOSIS — M545 Low back pain: Secondary | ICD-10-CM | POA: Diagnosis not present

## 2013-06-08 DIAGNOSIS — Z79899 Other long term (current) drug therapy: Secondary | ICD-10-CM | POA: Diagnosis not present

## 2013-06-08 DIAGNOSIS — K219 Gastro-esophageal reflux disease without esophagitis: Secondary | ICD-10-CM | POA: Diagnosis not present

## 2013-06-08 DIAGNOSIS — Z5181 Encounter for therapeutic drug level monitoring: Secondary | ICD-10-CM | POA: Diagnosis not present

## 2013-06-08 DIAGNOSIS — R5381 Other malaise: Secondary | ICD-10-CM | POA: Diagnosis not present

## 2013-06-25 DIAGNOSIS — Z5181 Encounter for therapeutic drug level monitoring: Secondary | ICD-10-CM | POA: Diagnosis not present

## 2013-06-25 DIAGNOSIS — Z79899 Other long term (current) drug therapy: Secondary | ICD-10-CM | POA: Diagnosis not present

## 2013-06-25 DIAGNOSIS — K219 Gastro-esophageal reflux disease without esophagitis: Secondary | ICD-10-CM | POA: Diagnosis not present

## 2013-06-25 DIAGNOSIS — G8929 Other chronic pain: Secondary | ICD-10-CM | POA: Diagnosis not present

## 2013-06-26 DIAGNOSIS — Z79899 Other long term (current) drug therapy: Secondary | ICD-10-CM | POA: Diagnosis not present

## 2013-06-26 DIAGNOSIS — Z5181 Encounter for therapeutic drug level monitoring: Secondary | ICD-10-CM | POA: Diagnosis not present

## 2013-06-26 DIAGNOSIS — F3164 Bipolar disorder, current episode mixed, severe, with psychotic features: Secondary | ICD-10-CM | POA: Diagnosis not present

## 2013-06-26 DIAGNOSIS — Z1339 Encounter for screening examination for other mental health and behavioral disorders: Secondary | ICD-10-CM | POA: Diagnosis not present

## 2013-06-29 DIAGNOSIS — K219 Gastro-esophageal reflux disease without esophagitis: Secondary | ICD-10-CM | POA: Diagnosis not present

## 2013-06-29 DIAGNOSIS — G4700 Insomnia, unspecified: Secondary | ICD-10-CM | POA: Diagnosis not present

## 2013-06-29 DIAGNOSIS — M545 Low back pain: Secondary | ICD-10-CM | POA: Diagnosis not present

## 2013-06-29 DIAGNOSIS — R5381 Other malaise: Secondary | ICD-10-CM | POA: Diagnosis not present

## 2013-07-15 DIAGNOSIS — Z79899 Other long term (current) drug therapy: Secondary | ICD-10-CM | POA: Diagnosis not present

## 2013-07-15 DIAGNOSIS — M549 Dorsalgia, unspecified: Secondary | ICD-10-CM | POA: Diagnosis not present

## 2013-07-15 DIAGNOSIS — G608 Other hereditary and idiopathic neuropathies: Secondary | ICD-10-CM | POA: Diagnosis not present

## 2013-07-15 DIAGNOSIS — E669 Obesity, unspecified: Secondary | ICD-10-CM | POA: Diagnosis not present

## 2013-07-15 DIAGNOSIS — R5381 Other malaise: Secondary | ICD-10-CM | POA: Diagnosis not present

## 2013-07-22 DIAGNOSIS — M5126 Other intervertebral disc displacement, lumbar region: Secondary | ICD-10-CM | POA: Diagnosis not present

## 2013-07-22 DIAGNOSIS — M5137 Other intervertebral disc degeneration, lumbosacral region: Secondary | ICD-10-CM | POA: Diagnosis not present

## 2013-07-24 DIAGNOSIS — F3164 Bipolar disorder, current episode mixed, severe, with psychotic features: Secondary | ICD-10-CM | POA: Diagnosis not present

## 2013-07-29 DIAGNOSIS — I10 Essential (primary) hypertension: Secondary | ICD-10-CM | POA: Diagnosis not present

## 2013-07-29 DIAGNOSIS — G8929 Other chronic pain: Secondary | ICD-10-CM | POA: Diagnosis not present

## 2013-07-29 DIAGNOSIS — R5381 Other malaise: Secondary | ICD-10-CM | POA: Diagnosis not present

## 2013-07-29 DIAGNOSIS — E669 Obesity, unspecified: Secondary | ICD-10-CM | POA: Diagnosis not present

## 2013-08-11 DIAGNOSIS — I10 Essential (primary) hypertension: Secondary | ICD-10-CM | POA: Diagnosis not present

## 2013-08-11 DIAGNOSIS — Z79899 Other long term (current) drug therapy: Secondary | ICD-10-CM | POA: Diagnosis not present

## 2013-08-11 DIAGNOSIS — G8929 Other chronic pain: Secondary | ICD-10-CM | POA: Diagnosis not present

## 2013-08-11 DIAGNOSIS — Z5181 Encounter for therapeutic drug level monitoring: Secondary | ICD-10-CM | POA: Diagnosis not present

## 2013-08-26 DIAGNOSIS — G8929 Other chronic pain: Secondary | ICD-10-CM | POA: Diagnosis not present

## 2013-08-26 DIAGNOSIS — N390 Urinary tract infection, site not specified: Secondary | ICD-10-CM | POA: Diagnosis not present

## 2013-08-26 DIAGNOSIS — R000 Tachycardia, unspecified: Secondary | ICD-10-CM | POA: Diagnosis not present

## 2013-08-26 DIAGNOSIS — I10 Essential (primary) hypertension: Secondary | ICD-10-CM | POA: Diagnosis not present

## 2013-08-28 DIAGNOSIS — F3164 Bipolar disorder, current episode mixed, severe, with psychotic features: Secondary | ICD-10-CM | POA: Diagnosis not present

## 2013-11-15 DIAGNOSIS — IMO0002 Reserved for concepts with insufficient information to code with codable children: Secondary | ICD-10-CM | POA: Diagnosis not present

## 2013-11-15 DIAGNOSIS — W010XXA Fall on same level from slipping, tripping and stumbling without subsequent striking against object, initial encounter: Secondary | ICD-10-CM | POA: Diagnosis not present

## 2013-11-15 DIAGNOSIS — S335XXA Sprain of ligaments of lumbar spine, initial encounter: Secondary | ICD-10-CM | POA: Diagnosis not present

## 2013-11-15 DIAGNOSIS — M545 Low back pain, unspecified: Secondary | ICD-10-CM | POA: Diagnosis not present

## 2013-11-15 DIAGNOSIS — S79919A Unspecified injury of unspecified hip, initial encounter: Secondary | ICD-10-CM | POA: Diagnosis not present

## 2013-11-15 DIAGNOSIS — S79929A Unspecified injury of unspecified thigh, initial encounter: Secondary | ICD-10-CM | POA: Diagnosis not present

## 2013-11-19 DIAGNOSIS — Z79899 Other long term (current) drug therapy: Secondary | ICD-10-CM | POA: Diagnosis not present

## 2013-11-20 DIAGNOSIS — F4310 Post-traumatic stress disorder, unspecified: Secondary | ICD-10-CM | POA: Diagnosis not present

## 2013-11-23 DIAGNOSIS — Z79899 Other long term (current) drug therapy: Secondary | ICD-10-CM | POA: Diagnosis not present

## 2013-11-23 DIAGNOSIS — R002 Palpitations: Secondary | ICD-10-CM | POA: Diagnosis not present

## 2013-11-23 DIAGNOSIS — K219 Gastro-esophageal reflux disease without esophagitis: Secondary | ICD-10-CM | POA: Diagnosis not present

## 2013-11-23 DIAGNOSIS — I509 Heart failure, unspecified: Secondary | ICD-10-CM | POA: Diagnosis not present

## 2013-11-23 DIAGNOSIS — R5383 Other fatigue: Secondary | ICD-10-CM | POA: Diagnosis not present

## 2013-11-23 DIAGNOSIS — R0602 Shortness of breath: Secondary | ICD-10-CM | POA: Diagnosis not present

## 2013-11-23 DIAGNOSIS — R0609 Other forms of dyspnea: Secondary | ICD-10-CM | POA: Diagnosis not present

## 2013-11-23 DIAGNOSIS — R5381 Other malaise: Secondary | ICD-10-CM | POA: Diagnosis not present

## 2013-11-23 DIAGNOSIS — I10 Essential (primary) hypertension: Secondary | ICD-10-CM | POA: Diagnosis not present

## 2013-11-23 DIAGNOSIS — F411 Generalized anxiety disorder: Secondary | ICD-10-CM | POA: Diagnosis not present

## 2013-12-15 DIAGNOSIS — M5137 Other intervertebral disc degeneration, lumbosacral region: Secondary | ICD-10-CM | POA: Diagnosis not present

## 2013-12-15 DIAGNOSIS — R109 Unspecified abdominal pain: Secondary | ICD-10-CM | POA: Diagnosis not present

## 2013-12-15 DIAGNOSIS — R112 Nausea with vomiting, unspecified: Secondary | ICD-10-CM | POA: Diagnosis not present

## 2013-12-16 DIAGNOSIS — R109 Unspecified abdominal pain: Secondary | ICD-10-CM | POA: Diagnosis not present

## 2013-12-16 DIAGNOSIS — Z79899 Other long term (current) drug therapy: Secondary | ICD-10-CM | POA: Diagnosis not present

## 2013-12-16 DIAGNOSIS — R112 Nausea with vomiting, unspecified: Secondary | ICD-10-CM | POA: Diagnosis not present

## 2013-12-16 DIAGNOSIS — N390 Urinary tract infection, site not specified: Secondary | ICD-10-CM | POA: Diagnosis not present

## 2013-12-16 DIAGNOSIS — R1084 Generalized abdominal pain: Secondary | ICD-10-CM | POA: Diagnosis not present

## 2013-12-16 DIAGNOSIS — I10 Essential (primary) hypertension: Secondary | ICD-10-CM | POA: Diagnosis not present

## 2013-12-16 DIAGNOSIS — K219 Gastro-esophageal reflux disease without esophagitis: Secondary | ICD-10-CM | POA: Diagnosis not present

## 2013-12-16 DIAGNOSIS — I509 Heart failure, unspecified: Secondary | ICD-10-CM | POA: Diagnosis not present

## 2013-12-22 DIAGNOSIS — K591 Functional diarrhea: Secondary | ICD-10-CM | POA: Diagnosis not present

## 2013-12-22 DIAGNOSIS — M549 Dorsalgia, unspecified: Secondary | ICD-10-CM | POA: Diagnosis not present

## 2013-12-22 DIAGNOSIS — R1084 Generalized abdominal pain: Secondary | ICD-10-CM | POA: Diagnosis not present

## 2013-12-22 DIAGNOSIS — R112 Nausea with vomiting, unspecified: Secondary | ICD-10-CM | POA: Diagnosis not present

## 2013-12-29 DIAGNOSIS — R110 Nausea: Secondary | ICD-10-CM | POA: Diagnosis not present

## 2013-12-29 DIAGNOSIS — R109 Unspecified abdominal pain: Secondary | ICD-10-CM | POA: Diagnosis not present

## 2014-01-01 DIAGNOSIS — Z713 Dietary counseling and surveillance: Secondary | ICD-10-CM | POA: Diagnosis not present

## 2014-01-01 DIAGNOSIS — Z13820 Encounter for screening for osteoporosis: Secondary | ICD-10-CM | POA: Diagnosis not present

## 2014-01-01 DIAGNOSIS — R635 Abnormal weight gain: Secondary | ICD-10-CM | POA: Diagnosis not present

## 2014-01-01 DIAGNOSIS — Z6841 Body Mass Index (BMI) 40.0 and over, adult: Secondary | ICD-10-CM | POA: Diagnosis not present

## 2014-01-01 DIAGNOSIS — E663 Overweight: Secondary | ICD-10-CM | POA: Diagnosis not present

## 2014-01-01 DIAGNOSIS — M539 Dorsopathy, unspecified: Secondary | ICD-10-CM | POA: Diagnosis not present

## 2014-01-01 DIAGNOSIS — G9332 Myalgic encephalomyelitis/chronic fatigue syndrome: Secondary | ICD-10-CM | POA: Diagnosis not present

## 2014-01-01 DIAGNOSIS — R5382 Chronic fatigue, unspecified: Secondary | ICD-10-CM | POA: Diagnosis not present

## 2014-01-06 DIAGNOSIS — R112 Nausea with vomiting, unspecified: Secondary | ICD-10-CM | POA: Diagnosis not present

## 2014-01-06 DIAGNOSIS — R51 Headache: Secondary | ICD-10-CM | POA: Diagnosis not present

## 2014-01-06 DIAGNOSIS — G43909 Migraine, unspecified, not intractable, without status migrainosus: Secondary | ICD-10-CM | POA: Diagnosis not present

## 2014-01-06 DIAGNOSIS — M549 Dorsalgia, unspecified: Secondary | ICD-10-CM | POA: Diagnosis not present

## 2014-01-06 DIAGNOSIS — G8929 Other chronic pain: Secondary | ICD-10-CM | POA: Diagnosis not present

## 2014-01-07 ENCOUNTER — Emergency Department (HOSPITAL_COMMUNITY)
Admission: EM | Admit: 2014-01-07 | Discharge: 2014-01-08 | Disposition: A | Discharge status: Other Acute Inpatient Hospital | Payer: Medicare HMO | Attending: Emergency Medicine | Admitting: Emergency Medicine

## 2014-01-07 ENCOUNTER — Encounter (HOSPITAL_COMMUNITY): Payer: Self-pay | Admitting: Emergency Medicine

## 2014-01-07 DIAGNOSIS — F1320 Sedative, hypnotic or anxiolytic dependence, uncomplicated: Secondary | ICD-10-CM | POA: Insufficient documentation

## 2014-01-07 DIAGNOSIS — M549 Dorsalgia, unspecified: Secondary | ICD-10-CM | POA: Insufficient documentation

## 2014-01-07 DIAGNOSIS — I10 Essential (primary) hypertension: Secondary | ICD-10-CM | POA: Insufficient documentation

## 2014-01-07 DIAGNOSIS — Z3202 Encounter for pregnancy test, result negative: Secondary | ICD-10-CM | POA: Insufficient documentation

## 2014-01-07 DIAGNOSIS — I509 Heart failure, unspecified: Secondary | ICD-10-CM | POA: Insufficient documentation

## 2014-01-07 DIAGNOSIS — G8929 Other chronic pain: Secondary | ICD-10-CM | POA: Insufficient documentation

## 2014-01-07 DIAGNOSIS — M129 Arthropathy, unspecified: Secondary | ICD-10-CM | POA: Insufficient documentation

## 2014-01-07 DIAGNOSIS — Z79899 Other long term (current) drug therapy: Secondary | ICD-10-CM | POA: Insufficient documentation

## 2014-01-07 HISTORY — DX: Bipolar disorder, unspecified: F31.9

## 2014-01-07 HISTORY — DX: Reserved for concepts with insufficient information to code with codable children: IMO0002

## 2014-01-07 LAB — ACETAMINOPHEN LEVEL: Acetaminophen (Tylenol), Serum: <15 ug/mL (ref 10–30)

## 2014-01-07 LAB — CBC
HEMATOCRIT: 37.3 % (ref 36.0–46.0)
Hemoglobin: 13.1 g/dL (ref 12.0–15.0)
MCH: 30.5 pg (ref 26.0–34.0)
MCHC: 35.1 g/dL (ref 30.0–36.0)
MCV: 86.9 fL (ref 78.0–100.0)
PLATELETS: 274 10*3/uL (ref 150–400)
RBC: 4.29 MIL/uL (ref 3.87–5.11)
RDW: 13 % (ref 11.5–15.5)
WBC: 10.2 10*3/uL (ref 4.0–10.5)

## 2014-01-07 LAB — RAPID URINE DRUG SCREEN, HOSP PERFORMED
Amphetamines: NOT DETECTED
BENZODIAZEPINES: POSITIVE — AB
Barbiturates: NOT DETECTED
Cocaine: NOT DETECTED
OPIATES: POSITIVE — AB
Tetrahydrocannabinol: NOT DETECTED

## 2014-01-07 LAB — COMPREHENSIVE METABOLIC PANEL
ALBUMIN: 3.3 g/dL — AB (ref 3.5–5.2)
ALT: 13 U/L (ref 0–35)
AST: 11 U/L (ref 0–37)
Alkaline Phosphatase: 156 U/L — ABNORMAL HIGH (ref 39–117)
BUN: 20 mg/dL (ref 6–23)
CALCIUM: 9.2 mg/dL (ref 8.4–10.5)
CO2: 26 mEq/L (ref 19–32)
CREATININE: 0.92 mg/dL (ref 0.50–1.10)
Chloride: 98 mEq/L (ref 96–112)
GFR calc Af Amer: 83 mL/min — ABNORMAL LOW (ref >90)
GFR, EST NON AFRICAN AMERICAN: 72 mL/min — AB (ref >90)
Glucose, Bld: 104 mg/dL — ABNORMAL HIGH (ref 70–99)
Potassium: 4 mEq/L (ref 3.7–5.3)
Sodium: 138 mEq/L (ref 137–147)
Total Protein: 7.7 g/dL (ref 6.0–8.3)

## 2014-01-07 LAB — ETHANOL: Alcohol, Ethyl (B): <11 mg/dL (ref 0–11)

## 2014-01-07 LAB — SALICYLATE LEVEL

## 2014-01-07 MED ORDER — DULOXETINE HCL 60 MG PO CPEP
90.0000 mg | ORAL_CAPSULE | Freq: Every day | ORAL | Status: DC
  Administered 2014-01-08: 90 mg via ORAL
  Filled 2014-01-07 (×2): qty 1

## 2014-01-07 MED ORDER — PANTOPRAZOLE SODIUM 40 MG PO TBEC
40.0000 mg | DELAYED_RELEASE_TABLET | Freq: Every day | ORAL | Status: DC
  Administered 2014-01-08: 40 mg via ORAL
  Filled 2014-01-07: qty 1

## 2014-01-07 MED ORDER — TRAZODONE HCL 50 MG PO TABS
150.0000 mg | ORAL_TABLET | Freq: Every day | ORAL | Status: DC
  Administered 2014-01-08: 150 mg via ORAL
  Filled 2014-01-07: qty 3

## 2014-01-07 MED ORDER — ARIPIPRAZOLE 10 MG PO TABS
10.0000 mg | ORAL_TABLET | Freq: Every day | ORAL | Status: DC
  Administered 2014-01-08: 10 mg via ORAL
  Filled 2014-01-07 (×2): qty 1

## 2014-01-07 MED ORDER — LISINOPRIL-HYDROCHLOROTHIAZIDE 20-12.5 MG PO TABS: 1.0000 | ORAL_TABLET | Freq: Every day | ORAL | Status: DC

## 2014-01-07 MED ORDER — ALPRAZOLAM 0.5 MG PO TABS
1.0000 mg | ORAL_TABLET | Freq: Four times a day (QID) | ORAL | Status: DC
  Administered 2014-01-07 – 2014-01-08 (×3): 1 mg via ORAL
  Filled 2014-01-07 (×3): qty 2

## 2014-01-07 MED ORDER — CIPROFLOXACIN HCL 500 MG PO TABS
500.0000 mg | ORAL_TABLET | Freq: Two times a day (BID) | ORAL | Status: DC
  Administered 2014-01-08: 500 mg via ORAL
  Filled 2014-01-07: qty 1

## 2014-01-07 MED ORDER — ONDANSETRON 4 MG PO TBDP
4.0000 mg | ORAL_TABLET | Freq: Three times a day (TID) | ORAL | Status: DC | PRN
  Administered 2014-01-07 – 2014-01-08 (×2): 4 mg via ORAL
  Filled 2014-01-07 (×2): qty 1

## 2014-01-07 MED ORDER — IBUPROFEN 800 MG PO TABS
800.0000 mg | ORAL_TABLET | Freq: Four times a day (QID) | ORAL | Status: DC | PRN
  Administered 2014-01-07 – 2014-01-08 (×2): 800 mg via ORAL
  Filled 2014-01-07 (×2): qty 1
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
## 2014-01-07 NOTE — ED Provider Notes (Signed)
Medical screening examination/treatment/procedure(s) were performed by non-physician practitioner and as supervising physician I was immediately available for consultation/collaboration.   EKG Interpretation None        Dagmar Hait, MD 01/07/14 2013

## 2014-01-07 NOTE — ED Notes (Signed)
telepsych in progress 

## 2014-01-07 NOTE — ED Provider Notes (Signed)
CSN: [id number]     Arrival date & time 01/07/14  1429 History   First MD Initiated Contact with Patient 01/07/14 1540     Chief Complaint  Patient presents with  . Medical Clearance     (Consider location/radiation/quality/duration/timing/severity/associated sxs/prior Treatment) HPI Comments: Patient presents to the emergency department with chief complaint of requesting detox from benzodiazepines. She states that she suffers from chronic back pain, and is scheduled to be seen by a chronic pain specialist, but was told that she needed to detox from Xanax prior to seeing him. She denies any symptoms currently. She denies suicidal or homicidal ideation. There no aggravating or relieving factors.  The history is provided by the patient. No language interpreter was used.    Past Medical History  Diagnosis Date  . Hypertension   . CHF (congestive heart failure)   . Arthritis    Past Surgical History  Procedure Laterality Date  . Cholecystectomy    . Appendectomy    . Abdominal hysterectomy     No family history on file. History  Substance Use Topics  . Smoking status: Never Smoker   . Smokeless tobacco: Not on file  . Alcohol Use: No   OB History   Grav Para Term Preterm Abortions TAB SAB Ect Mult Living                 Review of Systems  All other systems reviewed and are negative.      Allergies  Morphine and Oxymorphone  Home Medications   Current Outpatient Rx  Name  Route  Sig  Dispense  Refill  . ALPRAZolam (XANAX) 1 MG tablet   Oral   Take 1 mg by mouth 4 (four) times daily.         . ARIPiprazole (ABILIFY) 5 MG tablet   Oral   Take 5 mg by mouth at bedtime.         Marland Kitchen asenapine (SAPHRIS) 5 MG SUBL   Sublingual   Place 5 mg under the tongue daily.         . carvedilol (COREG) 3.125 MG tablet   Oral   Take 3.125 mg by mouth 2 (two) times daily with a meal.          . DULoxetine (CYMBALTA) 60 MG capsule   Oral   Take 120 mg by mouth  daily.         Marland Kitchen esomeprazole (NEXIUM) 40 MG capsule   Oral   Take 40 mg by mouth daily before breakfast.         . furosemide (LASIX) 20 MG tablet   Oral   Take 20 mg by mouth daily.         Marland Kitchen lisinopril (PRINIVIL,ZESTRIL) 20 MG tablet   Oral   Take 20 mg by mouth daily.         . ondansetron (ZOFRAN) 4 MG tablet   Oral   Take 4 mg by mouth every 8 (eight) hours as needed for nausea.         Marland Kitchen oxcarbazepine (TRILEPTAL) 600 MG tablet   Oral   Take 600 mg by mouth 2 (two) times daily.         Marland Kitchen oxyCODONE-acetaminophen (PERCOCET) 10-325 MG per tablet   Oral   Take 1 tablet by mouth 5 (five) times daily as needed for pain.         Marland Kitchen oxyCODONE-acetaminophen (PERCOCET) 5-325 MG per tablet   Oral   Take 2  tablets by mouth every 4 (four) hours as needed for pain.   20 tablet   0   . oxymorphone (OPANA ER) 30 MG 12 hr tablet   Oral   Take 30 mg by mouth every 12 (twelve) hours.         . temazepam (RESTORIL) 15 MG capsule   Oral   Take 15 mg by mouth at bedtime.         . traZODone (DESYREL) 150 MG tablet   Oral   Take 150 mg by mouth at bedtime.          BP 114/70  Pulse 95  Temp(Src) 97.9 F (36.6 C)  Resp 16  SpO2 100% Physical Exam  Nursing note and vitals reviewed. Constitutional: She is oriented to person, place, and time. She appears well-developed and well-nourished.  HENT:  Head: Normocephalic and atraumatic.  Eyes: Conjunctivae and EOM are normal. Pupils are equal, round, and reactive to light.  Neck: Normal range of motion. Neck supple.  Cardiovascular: Normal rate and regular rhythm.  Exam reveals no gallop and no friction rub.   No murmur heard. Pulmonary/Chest: Effort normal and breath sounds normal. No respiratory distress. She has no wheezes. She has no rales. She exhibits no tenderness.  Abdominal: Soft. Bowel sounds are normal. She exhibits no distension and no mass. There is no tenderness. There is no rebound and no guarding.   Musculoskeletal: Normal range of motion. She exhibits no edema and no tenderness.  Neurological: She is alert and oriented to person, place, and time.  Skin: Skin is warm and dry.  Psychiatric: She has a normal mood and affect. Her behavior is normal. Judgment and thought content normal.    ED Course  Procedures (including critical care time) Results for orders placed during the hospital encounter of 01/07/14  ACETAMINOPHEN LEVEL      Result Value Ref Range   Acetaminophen (Tylenol), Serum <15.0  10 - 30 ug/mL  CBC      Result Value Ref Range   WBC 10.2  4.0 - 10.5 K/uL   RBC 4.29  3.87 - 5.11 MIL/uL   Hemoglobin 13.1  12.0 - 15.0 g/dL   HCT 16.137.3  09.636.0 - 04.546.0 %   MCV 86.9  78.0 - 100.0 fL   MCH 30.5  26.0 - 34.0 pg   MCHC 35.1  30.0 - 36.0 g/dL   RDW 40.913.0  81.111.5 - 91.415.5 %   Platelets 274  150 - 400 K/uL  COMPREHENSIVE METABOLIC PANEL      Result Value Ref Range   Sodium 138  137 - 147 mEq/L   Potassium 4.0  3.7 - 5.3 mEq/L   Chloride 98  96 - 112 mEq/L   CO2 26  19 - 32 mEq/L   Glucose, Bld 104 (*) 70 - 99 mg/dL   BUN 20  6 - 23 mg/dL   Creatinine, Ser 7.820.92  0.50 - 1.10 mg/dL   Calcium 9.2  8.4 - 95.610.5 mg/dL   Total Protein 7.7  6.0 - 8.3 g/dL   Albumin 3.3 (*) 3.5 - 5.2 g/dL   AST 11  0 - 37 U/L   ALT 13  0 - 35 U/L   Alkaline Phosphatase 156 (*) 39 - 117 U/L   Total Bilirubin <0.2 (*) 0.3 - 1.2 mg/dL   GFR calc non Af Amer 72 (*) >90 mL/min   GFR calc Af Amer 83 (*) >90 mL/min  ETHANOL  Result Value Ref Range   Alcohol, Ethyl (B) <11  0 - 11 mg/dL  SALICYLATE LEVEL      Result Value Ref Range   Salicylate Lvl <2.0 (*) 2.8 - 20.0 mg/dL  URINE RAPID DRUG SCREEN (HOSP PERFORMED)      Result Value Ref Range   Opiates POSITIVE (*) NONE DETECTED   Cocaine NONE DETECTED  NONE DETECTED   Benzodiazepines POSITIVE (*) NONE DETECTED   Amphetamines NONE DETECTED  NONE DETECTED   Tetrahydrocannabinol NONE DETECTED  NONE DETECTED   Barbiturates NONE DETECTED  NONE  DETECTED       EKG Interpretation None      MDM   Final diagnoses:  None    Patient requesting detox from Xanax. Will talk with behavioral health.  No SI or HI.  TTS consult is pending.    Roxy Horseman, PA-C 01/07/14 [phone]

## 2014-01-07 NOTE — ED Notes (Addendum)
[facility] her over for detox from benzos has been on them for 20 years denies SI or HI states that she has Overdosed on opana before

## 2014-01-07 NOTE — ED Notes (Signed)
Pt wanded by security. Pt belongings at pod C nurse's station

## 2014-01-07 NOTE — BHH Counselor (Signed)
Writer completed teleassessment.  Writer spoke with RN, pt will be referred to outside facilities.  Writer was unable to reach EDP at the time of the assessment.  No beds are available @ [facility]

## 2014-01-07 NOTE — ED Notes (Signed)
Pt wanded by security. 

## 2014-01-07 NOTE — ED Notes (Signed)
Pt placed in paper scrubs.

## 2014-01-08 DIAGNOSIS — F39 Unspecified mood [affective] disorder: Secondary | ICD-10-CM | POA: Insufficient documentation

## 2014-01-08 HISTORY — DX: Unspecified mood (affective) disorder: F39

## 2014-01-08 LAB — URINE MICROSCOPIC-ADD ON

## 2014-01-08 LAB — URINALYSIS, ROUTINE W REFLEX MICROSCOPIC
Bilirubin Urine: NEGATIVE
Glucose, UA: NEGATIVE mg/dL
Hgb urine dipstick: NEGATIVE
Ketones, ur: NEGATIVE mg/dL
Nitrite: NEGATIVE
PH: 5.5 (ref 5.0–8.0)
Protein, ur: NEGATIVE mg/dL
Specific Gravity, Urine: 1.017 (ref 1.005–1.030)
Urobilinogen, UA: 0.2 mg/dL (ref 0.0–1.0)

## 2014-01-08 LAB — POC URINE PREG, ED: PREG TEST UR: NEGATIVE

## 2014-01-08 MED ORDER — ALPRAZOLAM 0.5 MG PO TABS
1.0000 mg | ORAL_TABLET | Freq: Three times a day (TID) | ORAL | Status: DC
  Administered 2014-01-08: 1 mg via ORAL
  Filled 2014-01-08: qty 2

## 2014-01-08 NOTE — Progress Notes (Signed)
MHT received a call from [place], Charity fundraiser at [facility].  Pt was accepted by Dr. Mayford Knife to room 2585-1.  RN report [phone].  Darlene, RN stated will callback when bed ready.  Blain Pais, MHT/NS

## 2014-01-08 NOTE — Progress Notes (Addendum)
Pt has been accepted to [facility] [place] for [employer]. Pt will be transported via taxi. Pt and taxi service given instructions that pt should be dropped off at the ED. Pt and taxi services voiced understanding.    [address] [place] [phone]

## 2014-01-08 NOTE — ED Notes (Signed)
PATIENT DECLINED AT [facility] DUE TO HAVING MEDICARE

## 2014-01-08 NOTE — ED Provider Notes (Signed)
  Physical Exam  BP 118/56  Pulse 78  Temp(Src) 98.3 F (36.8 C) (Oral)  Resp 18  SpO2 94%  Physical Exam  ED Course  Procedures  MDM Patient has been accepted at [facility]. No IVC      Juliet Rude. Rubin Payor, MD 01/08/14 1040

## 2014-01-09 DIAGNOSIS — F39 Unspecified mood [affective] disorder: Secondary | ICD-10-CM | POA: Diagnosis not present

## 2014-01-10 DIAGNOSIS — F39 Unspecified mood [affective] disorder: Secondary | ICD-10-CM | POA: Diagnosis not present

## 2014-01-11 DIAGNOSIS — F39 Unspecified mood [affective] disorder: Secondary | ICD-10-CM | POA: Diagnosis not present

## 2014-01-12 DIAGNOSIS — F39 Unspecified mood [affective] disorder: Secondary | ICD-10-CM | POA: Diagnosis not present

## 2014-01-13 DIAGNOSIS — R0602 Shortness of breath: Secondary | ICD-10-CM | POA: Diagnosis not present

## 2014-01-13 DIAGNOSIS — F39 Unspecified mood [affective] disorder: Secondary | ICD-10-CM | POA: Diagnosis not present

## 2014-01-13 DIAGNOSIS — I509 Heart failure, unspecified: Secondary | ICD-10-CM | POA: Diagnosis not present

## 2014-01-13 DIAGNOSIS — M7989 Other specified soft tissue disorders: Secondary | ICD-10-CM | POA: Diagnosis not present

## 2014-01-13 DIAGNOSIS — I10 Essential (primary) hypertension: Secondary | ICD-10-CM | POA: Diagnosis not present

## 2014-01-14 DIAGNOSIS — R0602 Shortness of breath: Secondary | ICD-10-CM | POA: Diagnosis not present

## 2014-01-14 DIAGNOSIS — R609 Edema, unspecified: Secondary | ICD-10-CM | POA: Insufficient documentation

## 2014-01-14 DIAGNOSIS — I10 Essential (primary) hypertension: Secondary | ICD-10-CM | POA: Diagnosis not present

## 2014-01-14 DIAGNOSIS — R0989 Other specified symptoms and signs involving the circulatory and respiratory systems: Secondary | ICD-10-CM | POA: Diagnosis not present

## 2014-01-14 DIAGNOSIS — F39 Unspecified mood [affective] disorder: Secondary | ICD-10-CM | POA: Diagnosis not present

## 2014-01-14 DIAGNOSIS — R0609 Other forms of dyspnea: Secondary | ICD-10-CM | POA: Diagnosis not present

## 2014-01-14 HISTORY — DX: Edema, unspecified: R60.9

## 2014-01-15 DIAGNOSIS — F39 Unspecified mood [affective] disorder: Secondary | ICD-10-CM | POA: Diagnosis not present

## 2014-01-15 DIAGNOSIS — R0609 Other forms of dyspnea: Secondary | ICD-10-CM | POA: Diagnosis not present

## 2014-01-15 DIAGNOSIS — I10 Essential (primary) hypertension: Secondary | ICD-10-CM | POA: Diagnosis not present

## 2014-01-15 DIAGNOSIS — R609 Edema, unspecified: Secondary | ICD-10-CM | POA: Diagnosis not present

## 2014-01-25 DIAGNOSIS — Z79899 Other long term (current) drug therapy: Secondary | ICD-10-CM | POA: Diagnosis not present

## 2014-01-26 DIAGNOSIS — Z79899 Other long term (current) drug therapy: Secondary | ICD-10-CM | POA: Diagnosis not present

## 2014-02-02 DIAGNOSIS — Z79899 Other long term (current) drug therapy: Secondary | ICD-10-CM | POA: Diagnosis not present

## 2014-02-09 DIAGNOSIS — Z79899 Other long term (current) drug therapy: Secondary | ICD-10-CM | POA: Diagnosis not present

## 2014-02-09 DIAGNOSIS — Z5181 Encounter for therapeutic drug level monitoring: Secondary | ICD-10-CM | POA: Diagnosis not present

## 2014-02-16 DIAGNOSIS — Z5181 Encounter for therapeutic drug level monitoring: Secondary | ICD-10-CM | POA: Diagnosis not present

## 2014-02-16 DIAGNOSIS — Z79899 Other long term (current) drug therapy: Secondary | ICD-10-CM | POA: Diagnosis not present

## 2014-02-18 DIAGNOSIS — Z5181 Encounter for therapeutic drug level monitoring: Secondary | ICD-10-CM | POA: Diagnosis not present

## 2014-02-18 DIAGNOSIS — Z79899 Other long term (current) drug therapy: Secondary | ICD-10-CM | POA: Diagnosis not present

## 2014-03-01 DIAGNOSIS — Z79899 Other long term (current) drug therapy: Secondary | ICD-10-CM | POA: Diagnosis not present

## 2014-03-02 DIAGNOSIS — Z79899 Other long term (current) drug therapy: Secondary | ICD-10-CM | POA: Diagnosis not present

## 2014-03-06 DIAGNOSIS — I10 Essential (primary) hypertension: Secondary | ICD-10-CM | POA: Diagnosis not present

## 2014-03-06 DIAGNOSIS — K05219 Aggressive periodontitis, localized, unspecified severity: Secondary | ICD-10-CM | POA: Diagnosis not present

## 2014-03-06 DIAGNOSIS — K219 Gastro-esophageal reflux disease without esophagitis: Secondary | ICD-10-CM | POA: Diagnosis not present

## 2014-03-06 DIAGNOSIS — K029 Dental caries, unspecified: Secondary | ICD-10-CM | POA: Diagnosis not present

## 2014-03-06 DIAGNOSIS — Z79899 Other long term (current) drug therapy: Secondary | ICD-10-CM | POA: Diagnosis not present

## 2014-03-06 DIAGNOSIS — I509 Heart failure, unspecified: Secondary | ICD-10-CM | POA: Diagnosis not present

## 2014-03-06 DIAGNOSIS — K047 Periapical abscess without sinus: Secondary | ICD-10-CM | POA: Diagnosis not present

## 2014-03-06 DIAGNOSIS — E7800 Pure hypercholesterolemia, unspecified: Secondary | ICD-10-CM | POA: Diagnosis not present

## 2014-03-16 DIAGNOSIS — Z79899 Other long term (current) drug therapy: Secondary | ICD-10-CM | POA: Diagnosis not present

## 2014-03-30 DIAGNOSIS — Z79899 Other long term (current) drug therapy: Secondary | ICD-10-CM | POA: Diagnosis not present

## 2014-04-13 DIAGNOSIS — Z79899 Other long term (current) drug therapy: Secondary | ICD-10-CM | POA: Diagnosis not present

## 2014-04-28 DIAGNOSIS — Z79899 Other long term (current) drug therapy: Secondary | ICD-10-CM | POA: Diagnosis not present

## 2014-05-05 DIAGNOSIS — Z79899 Other long term (current) drug therapy: Secondary | ICD-10-CM | POA: Diagnosis not present

## 2014-05-06 DIAGNOSIS — Z79899 Other long term (current) drug therapy: Secondary | ICD-10-CM | POA: Diagnosis not present

## 2014-05-11 DIAGNOSIS — Z79899 Other long term (current) drug therapy: Secondary | ICD-10-CM | POA: Diagnosis not present

## 2014-05-13 DIAGNOSIS — Z79899 Other long term (current) drug therapy: Secondary | ICD-10-CM | POA: Diagnosis not present

## 2014-05-20 DIAGNOSIS — Z79899 Other long term (current) drug therapy: Secondary | ICD-10-CM | POA: Diagnosis not present

## 2014-05-25 DIAGNOSIS — Z79899 Other long term (current) drug therapy: Secondary | ICD-10-CM | POA: Diagnosis not present

## 2014-06-08 DIAGNOSIS — K219 Gastro-esophageal reflux disease without esophagitis: Secondary | ICD-10-CM | POA: Diagnosis not present

## 2014-06-08 DIAGNOSIS — R079 Chest pain, unspecified: Secondary | ICD-10-CM | POA: Diagnosis not present

## 2014-06-08 DIAGNOSIS — R0602 Shortness of breath: Secondary | ICD-10-CM | POA: Diagnosis not present

## 2014-06-08 DIAGNOSIS — I209 Angina pectoris, unspecified: Secondary | ICD-10-CM | POA: Diagnosis not present

## 2014-06-08 DIAGNOSIS — R0609 Other forms of dyspnea: Secondary | ICD-10-CM | POA: Diagnosis not present

## 2014-06-08 DIAGNOSIS — R609 Edema, unspecified: Secondary | ICD-10-CM | POA: Diagnosis not present

## 2014-06-08 DIAGNOSIS — R0789 Other chest pain: Secondary | ICD-10-CM | POA: Diagnosis not present

## 2014-06-08 DIAGNOSIS — I10 Essential (primary) hypertension: Secondary | ICD-10-CM | POA: Diagnosis not present

## 2014-06-09 DIAGNOSIS — R079 Chest pain, unspecified: Secondary | ICD-10-CM | POA: Diagnosis not present

## 2014-06-09 DIAGNOSIS — R0609 Other forms of dyspnea: Secondary | ICD-10-CM | POA: Diagnosis not present

## 2014-06-09 DIAGNOSIS — I10 Essential (primary) hypertension: Secondary | ICD-10-CM | POA: Diagnosis not present

## 2014-06-09 DIAGNOSIS — K219 Gastro-esophageal reflux disease without esophagitis: Secondary | ICD-10-CM | POA: Diagnosis not present

## 2014-06-29 DIAGNOSIS — Z79899 Other long term (current) drug therapy: Secondary | ICD-10-CM | POA: Diagnosis not present

## 2014-07-15 DIAGNOSIS — Z79899 Other long term (current) drug therapy: Secondary | ICD-10-CM | POA: Diagnosis not present

## 2014-07-19 DIAGNOSIS — Z79899 Other long term (current) drug therapy: Secondary | ICD-10-CM | POA: Diagnosis not present

## 2014-07-21 DIAGNOSIS — Z79899 Other long term (current) drug therapy: Secondary | ICD-10-CM | POA: Diagnosis not present

## 2014-07-23 DIAGNOSIS — I10 Essential (primary) hypertension: Secondary | ICD-10-CM | POA: Diagnosis not present

## 2014-07-23 DIAGNOSIS — N390 Urinary tract infection, site not specified: Secondary | ICD-10-CM | POA: Diagnosis not present

## 2014-07-23 DIAGNOSIS — K2270 Barrett's esophagus without dysplasia: Secondary | ICD-10-CM | POA: Diagnosis not present

## 2014-07-23 DIAGNOSIS — Z23 Encounter for immunization: Secondary | ICD-10-CM | POA: Diagnosis not present

## 2014-07-26 DIAGNOSIS — Z79899 Other long term (current) drug therapy: Secondary | ICD-10-CM | POA: Diagnosis not present

## 2014-07-28 DIAGNOSIS — Z79899 Other long term (current) drug therapy: Secondary | ICD-10-CM | POA: Diagnosis not present

## 2014-07-30 DIAGNOSIS — Z79899 Other long term (current) drug therapy: Secondary | ICD-10-CM | POA: Diagnosis not present

## 2014-08-02 DIAGNOSIS — Z79899 Other long term (current) drug therapy: Secondary | ICD-10-CM | POA: Diagnosis not present

## 2014-08-04 DIAGNOSIS — Z79899 Other long term (current) drug therapy: Secondary | ICD-10-CM | POA: Diagnosis not present

## 2014-08-05 DIAGNOSIS — I10 Essential (primary) hypertension: Secondary | ICD-10-CM | POA: Diagnosis not present

## 2014-08-05 DIAGNOSIS — R112 Nausea with vomiting, unspecified: Secondary | ICD-10-CM | POA: Diagnosis not present

## 2014-08-06 DIAGNOSIS — Z79899 Other long term (current) drug therapy: Secondary | ICD-10-CM | POA: Diagnosis not present

## 2014-08-11 DIAGNOSIS — Z79899 Other long term (current) drug therapy: Secondary | ICD-10-CM | POA: Diagnosis not present

## 2014-08-13 DIAGNOSIS — Z79899 Other long term (current) drug therapy: Secondary | ICD-10-CM | POA: Diagnosis not present

## 2014-08-16 DIAGNOSIS — F3132 Bipolar disorder, current episode depressed, moderate: Secondary | ICD-10-CM

## 2014-08-16 DIAGNOSIS — F4310 Post-traumatic stress disorder, unspecified: Secondary | ICD-10-CM | POA: Insufficient documentation

## 2014-08-16 DIAGNOSIS — Z79899 Other long term (current) drug therapy: Secondary | ICD-10-CM | POA: Diagnosis not present

## 2014-08-16 HISTORY — DX: Bipolar disorder, current episode depressed, moderate: F31.32

## 2014-08-16 HISTORY — DX: Post-traumatic stress disorder, unspecified: F43.10

## 2014-08-18 DIAGNOSIS — Z79899 Other long term (current) drug therapy: Secondary | ICD-10-CM | POA: Diagnosis not present

## 2014-08-23 DIAGNOSIS — Z79899 Other long term (current) drug therapy: Secondary | ICD-10-CM | POA: Diagnosis not present

## 2014-08-25 DIAGNOSIS — Z79899 Other long term (current) drug therapy: Secondary | ICD-10-CM | POA: Diagnosis not present

## 2014-08-27 DIAGNOSIS — Z79899 Other long term (current) drug therapy: Secondary | ICD-10-CM | POA: Diagnosis not present

## 2014-08-30 DIAGNOSIS — Z79899 Other long term (current) drug therapy: Secondary | ICD-10-CM | POA: Diagnosis not present

## 2014-09-02 DIAGNOSIS — Z79899 Other long term (current) drug therapy: Secondary | ICD-10-CM | POA: Diagnosis not present

## 2014-09-06 DIAGNOSIS — Z79899 Other long term (current) drug therapy: Secondary | ICD-10-CM | POA: Diagnosis not present

## 2014-09-08 DIAGNOSIS — Z79899 Other long term (current) drug therapy: Secondary | ICD-10-CM | POA: Diagnosis not present

## 2014-09-10 DIAGNOSIS — Z79899 Other long term (current) drug therapy: Secondary | ICD-10-CM | POA: Diagnosis not present

## 2014-09-13 DIAGNOSIS — Z79899 Other long term (current) drug therapy: Secondary | ICD-10-CM | POA: Diagnosis not present

## 2014-09-20 DIAGNOSIS — Z79899 Other long term (current) drug therapy: Secondary | ICD-10-CM | POA: Diagnosis not present

## 2014-09-22 DIAGNOSIS — Z79899 Other long term (current) drug therapy: Secondary | ICD-10-CM | POA: Diagnosis not present

## 2014-09-24 DIAGNOSIS — Z79899 Other long term (current) drug therapy: Secondary | ICD-10-CM | POA: Diagnosis not present

## 2014-09-27 DIAGNOSIS — Z79899 Other long term (current) drug therapy: Secondary | ICD-10-CM | POA: Diagnosis not present

## 2014-09-29 DIAGNOSIS — Z79899 Other long term (current) drug therapy: Secondary | ICD-10-CM | POA: Diagnosis not present

## 2014-10-01 DIAGNOSIS — Z79899 Other long term (current) drug therapy: Secondary | ICD-10-CM | POA: Diagnosis not present

## 2014-10-04 DIAGNOSIS — Z79899 Other long term (current) drug therapy: Secondary | ICD-10-CM | POA: Diagnosis not present

## 2014-10-06 DIAGNOSIS — Z79899 Other long term (current) drug therapy: Secondary | ICD-10-CM | POA: Diagnosis not present

## 2014-10-08 DIAGNOSIS — Z79899 Other long term (current) drug therapy: Secondary | ICD-10-CM | POA: Diagnosis not present

## 2014-10-11 DIAGNOSIS — Z79899 Other long term (current) drug therapy: Secondary | ICD-10-CM | POA: Diagnosis not present

## 2014-10-13 DIAGNOSIS — Z79899 Other long term (current) drug therapy: Secondary | ICD-10-CM | POA: Diagnosis not present

## 2014-10-18 DIAGNOSIS — Z79899 Other long term (current) drug therapy: Secondary | ICD-10-CM | POA: Diagnosis not present

## 2014-10-19 DIAGNOSIS — G8929 Other chronic pain: Secondary | ICD-10-CM | POA: Diagnosis not present

## 2014-10-19 DIAGNOSIS — M545 Low back pain: Secondary | ICD-10-CM | POA: Diagnosis not present

## 2014-10-20 DIAGNOSIS — Z79899 Other long term (current) drug therapy: Secondary | ICD-10-CM | POA: Diagnosis not present

## 2014-10-26 DIAGNOSIS — F3132 Bipolar disorder, current episode depressed, moderate: Secondary | ICD-10-CM | POA: Diagnosis not present

## 2014-10-27 DIAGNOSIS — Z79899 Other long term (current) drug therapy: Secondary | ICD-10-CM | POA: Diagnosis not present

## 2014-11-03 DIAGNOSIS — Z79899 Other long term (current) drug therapy: Secondary | ICD-10-CM | POA: Diagnosis not present

## 2014-11-06 DIAGNOSIS — I10 Essential (primary) hypertension: Secondary | ICD-10-CM | POA: Diagnosis not present

## 2014-11-06 DIAGNOSIS — M170 Bilateral primary osteoarthritis of knee: Secondary | ICD-10-CM | POA: Diagnosis not present

## 2014-11-06 DIAGNOSIS — M25462 Effusion, left knee: Secondary | ICD-10-CM | POA: Diagnosis not present

## 2014-11-06 DIAGNOSIS — M47896 Other spondylosis, lumbar region: Secondary | ICD-10-CM | POA: Diagnosis not present

## 2014-11-06 DIAGNOSIS — I509 Heart failure, unspecified: Secondary | ICD-10-CM | POA: Diagnosis not present

## 2014-11-06 DIAGNOSIS — Z79899 Other long term (current) drug therapy: Secondary | ICD-10-CM | POA: Diagnosis not present

## 2014-11-06 DIAGNOSIS — M25461 Effusion, right knee: Secondary | ICD-10-CM | POA: Diagnosis not present

## 2014-11-06 DIAGNOSIS — M5136 Other intervertebral disc degeneration, lumbar region: Secondary | ICD-10-CM | POA: Diagnosis not present

## 2014-11-06 DIAGNOSIS — E780 Pure hypercholesterolemia: Secondary | ICD-10-CM | POA: Diagnosis not present

## 2014-11-08 DIAGNOSIS — M179 Osteoarthritis of knee, unspecified: Secondary | ICD-10-CM | POA: Diagnosis not present

## 2014-11-10 DIAGNOSIS — Z79899 Other long term (current) drug therapy: Secondary | ICD-10-CM | POA: Diagnosis not present

## 2014-11-17 DIAGNOSIS — Z79899 Other long term (current) drug therapy: Secondary | ICD-10-CM | POA: Diagnosis not present

## 2014-11-28 DIAGNOSIS — R0602 Shortness of breath: Secondary | ICD-10-CM | POA: Diagnosis not present

## 2014-11-28 DIAGNOSIS — E860 Dehydration: Secondary | ICD-10-CM | POA: Diagnosis not present

## 2014-11-28 DIAGNOSIS — I10 Essential (primary) hypertension: Secondary | ICD-10-CM | POA: Diagnosis not present

## 2014-11-28 DIAGNOSIS — E780 Pure hypercholesterolemia: Secondary | ICD-10-CM | POA: Diagnosis not present

## 2014-11-28 DIAGNOSIS — M1990 Unspecified osteoarthritis, unspecified site: Secondary | ICD-10-CM | POA: Diagnosis not present

## 2014-11-28 DIAGNOSIS — F319 Bipolar disorder, unspecified: Secondary | ICD-10-CM | POA: Diagnosis not present

## 2014-11-28 DIAGNOSIS — R197 Diarrhea, unspecified: Secondary | ICD-10-CM | POA: Diagnosis not present

## 2014-11-28 DIAGNOSIS — R112 Nausea with vomiting, unspecified: Secondary | ICD-10-CM | POA: Diagnosis not present

## 2014-11-28 DIAGNOSIS — I509 Heart failure, unspecified: Secondary | ICD-10-CM | POA: Diagnosis not present

## 2014-11-29 DIAGNOSIS — R109 Unspecified abdominal pain: Secondary | ICD-10-CM | POA: Diagnosis not present

## 2014-11-29 DIAGNOSIS — R1110 Vomiting, unspecified: Secondary | ICD-10-CM | POA: Diagnosis not present

## 2014-11-29 DIAGNOSIS — I10 Essential (primary) hypertension: Secondary | ICD-10-CM | POA: Diagnosis not present

## 2014-11-29 DIAGNOSIS — R51 Headache: Secondary | ICD-10-CM | POA: Diagnosis not present

## 2014-11-29 DIAGNOSIS — R531 Weakness: Secondary | ICD-10-CM | POA: Diagnosis not present

## 2014-11-29 DIAGNOSIS — R42 Dizziness and giddiness: Secondary | ICD-10-CM | POA: Diagnosis not present

## 2014-12-02 DIAGNOSIS — I509 Heart failure, unspecified: Secondary | ICD-10-CM | POA: Diagnosis not present

## 2014-12-02 DIAGNOSIS — M1990 Unspecified osteoarthritis, unspecified site: Secondary | ICD-10-CM | POA: Diagnosis not present

## 2014-12-02 DIAGNOSIS — R0600 Dyspnea, unspecified: Secondary | ICD-10-CM | POA: Diagnosis not present

## 2014-12-02 DIAGNOSIS — F319 Bipolar disorder, unspecified: Secondary | ICD-10-CM | POA: Diagnosis not present

## 2014-12-02 DIAGNOSIS — R0602 Shortness of breath: Secondary | ICD-10-CM | POA: Diagnosis not present

## 2014-12-02 DIAGNOSIS — R42 Dizziness and giddiness: Secondary | ICD-10-CM | POA: Diagnosis not present

## 2014-12-02 DIAGNOSIS — R002 Palpitations: Secondary | ICD-10-CM | POA: Diagnosis not present

## 2014-12-02 DIAGNOSIS — I10 Essential (primary) hypertension: Secondary | ICD-10-CM | POA: Diagnosis not present

## 2014-12-14 DIAGNOSIS — Z79899 Other long term (current) drug therapy: Secondary | ICD-10-CM | POA: Diagnosis not present

## 2014-12-26 DIAGNOSIS — R079 Chest pain, unspecified: Secondary | ICD-10-CM | POA: Diagnosis not present

## 2014-12-28 DIAGNOSIS — Z79899 Other long term (current) drug therapy: Secondary | ICD-10-CM | POA: Diagnosis not present

## 2014-12-30 DIAGNOSIS — R112 Nausea with vomiting, unspecified: Secondary | ICD-10-CM | POA: Diagnosis not present

## 2015-01-03 DIAGNOSIS — G4700 Insomnia, unspecified: Secondary | ICD-10-CM | POA: Diagnosis not present

## 2015-01-03 DIAGNOSIS — R5382 Chronic fatigue, unspecified: Secondary | ICD-10-CM | POA: Diagnosis not present

## 2015-01-03 DIAGNOSIS — I10 Essential (primary) hypertension: Secondary | ICD-10-CM | POA: Diagnosis not present

## 2015-01-03 DIAGNOSIS — G894 Chronic pain syndrome: Secondary | ICD-10-CM | POA: Diagnosis not present

## 2015-01-04 DIAGNOSIS — Z79899 Other long term (current) drug therapy: Secondary | ICD-10-CM | POA: Diagnosis not present

## 2015-01-06 DIAGNOSIS — Z79899 Other long term (current) drug therapy: Secondary | ICD-10-CM | POA: Diagnosis not present

## 2015-01-19 DIAGNOSIS — I509 Heart failure, unspecified: Secondary | ICD-10-CM | POA: Diagnosis not present

## 2015-01-19 DIAGNOSIS — M79604 Pain in right leg: Secondary | ICD-10-CM | POA: Diagnosis not present

## 2015-01-19 DIAGNOSIS — M25561 Pain in right knee: Secondary | ICD-10-CM | POA: Diagnosis not present

## 2015-01-19 DIAGNOSIS — E780 Pure hypercholesterolemia: Secondary | ICD-10-CM | POA: Diagnosis not present

## 2015-01-19 DIAGNOSIS — I10 Essential (primary) hypertension: Secondary | ICD-10-CM | POA: Diagnosis not present

## 2015-01-19 DIAGNOSIS — R2241 Localized swelling, mass and lump, right lower limb: Secondary | ICD-10-CM | POA: Diagnosis not present

## 2015-01-27 DIAGNOSIS — F3132 Bipolar disorder, current episode depressed, moderate: Secondary | ICD-10-CM | POA: Diagnosis not present

## 2015-01-27 DIAGNOSIS — F900 Attention-deficit hyperactivity disorder, predominantly inattentive type: Secondary | ICD-10-CM | POA: Diagnosis not present

## 2015-01-27 DIAGNOSIS — F4312 Post-traumatic stress disorder, chronic: Secondary | ICD-10-CM | POA: Diagnosis not present

## 2015-02-28 DIAGNOSIS — F900 Attention-deficit hyperactivity disorder, predominantly inattentive type: Secondary | ICD-10-CM | POA: Diagnosis not present

## 2015-02-28 DIAGNOSIS — F3132 Bipolar disorder, current episode depressed, moderate: Secondary | ICD-10-CM | POA: Diagnosis not present

## 2015-02-28 DIAGNOSIS — F4312 Post-traumatic stress disorder, chronic: Secondary | ICD-10-CM | POA: Diagnosis not present

## 2015-03-07 DIAGNOSIS — M79609 Pain in unspecified limb: Secondary | ICD-10-CM | POA: Diagnosis not present

## 2015-03-07 DIAGNOSIS — R079 Chest pain, unspecified: Secondary | ICD-10-CM | POA: Diagnosis not present

## 2015-03-07 DIAGNOSIS — M1711 Unilateral primary osteoarthritis, right knee: Secondary | ICD-10-CM | POA: Diagnosis not present

## 2015-03-07 DIAGNOSIS — I10 Essential (primary) hypertension: Secondary | ICD-10-CM | POA: Diagnosis not present

## 2015-03-07 DIAGNOSIS — M179 Osteoarthritis of knee, unspecified: Secondary | ICD-10-CM | POA: Diagnosis not present

## 2015-03-07 DIAGNOSIS — G4700 Insomnia, unspecified: Secondary | ICD-10-CM | POA: Diagnosis not present

## 2015-03-07 DIAGNOSIS — G8929 Other chronic pain: Secondary | ICD-10-CM | POA: Diagnosis not present

## 2015-03-07 DIAGNOSIS — R5382 Chronic fatigue, unspecified: Secondary | ICD-10-CM | POA: Diagnosis not present

## 2015-03-07 DIAGNOSIS — G894 Chronic pain syndrome: Secondary | ICD-10-CM | POA: Diagnosis not present

## 2015-03-07 DIAGNOSIS — F430 Acute stress reaction: Secondary | ICD-10-CM | POA: Diagnosis not present

## 2015-03-08 DIAGNOSIS — Z79899 Other long term (current) drug therapy: Secondary | ICD-10-CM | POA: Diagnosis not present

## 2015-03-22 DIAGNOSIS — Z79899 Other long term (current) drug therapy: Secondary | ICD-10-CM | POA: Diagnosis not present

## 2015-03-28 DIAGNOSIS — I509 Heart failure, unspecified: Secondary | ICD-10-CM | POA: Diagnosis not present

## 2015-03-28 DIAGNOSIS — M1711 Unilateral primary osteoarthritis, right knee: Secondary | ICD-10-CM | POA: Diagnosis not present

## 2015-03-28 DIAGNOSIS — Z79899 Other long term (current) drug therapy: Secondary | ICD-10-CM | POA: Diagnosis not present

## 2015-03-28 DIAGNOSIS — M25461 Effusion, right knee: Secondary | ICD-10-CM | POA: Diagnosis not present

## 2015-03-28 DIAGNOSIS — G8929 Other chronic pain: Secondary | ICD-10-CM | POA: Diagnosis not present

## 2015-03-28 DIAGNOSIS — M25561 Pain in right knee: Secondary | ICD-10-CM | POA: Diagnosis not present

## 2015-03-28 DIAGNOSIS — I10 Essential (primary) hypertension: Secondary | ICD-10-CM | POA: Diagnosis not present

## 2015-03-30 DIAGNOSIS — I10 Essential (primary) hypertension: Secondary | ICD-10-CM | POA: Diagnosis not present

## 2015-03-30 DIAGNOSIS — R0602 Shortness of breath: Secondary | ICD-10-CM | POA: Diagnosis not present

## 2015-03-30 DIAGNOSIS — M79604 Pain in right leg: Secondary | ICD-10-CM | POA: Diagnosis not present

## 2015-03-30 DIAGNOSIS — Z79899 Other long term (current) drug therapy: Secondary | ICD-10-CM | POA: Diagnosis not present

## 2015-03-30 DIAGNOSIS — M1711 Unilateral primary osteoarthritis, right knee: Secondary | ICD-10-CM | POA: Diagnosis not present

## 2015-03-30 DIAGNOSIS — Z0389 Encounter for observation for other suspected diseases and conditions ruled out: Secondary | ICD-10-CM | POA: Diagnosis not present

## 2015-03-30 DIAGNOSIS — F319 Bipolar disorder, unspecified: Secondary | ICD-10-CM | POA: Diagnosis not present

## 2015-04-05 DIAGNOSIS — Z79899 Other long term (current) drug therapy: Secondary | ICD-10-CM | POA: Diagnosis not present

## 2015-04-19 DIAGNOSIS — Z79899 Other long term (current) drug therapy: Secondary | ICD-10-CM | POA: Diagnosis not present

## 2015-04-28 DIAGNOSIS — E780 Pure hypercholesterolemia: Secondary | ICD-10-CM | POA: Diagnosis not present

## 2015-04-28 DIAGNOSIS — G8929 Other chronic pain: Secondary | ICD-10-CM | POA: Diagnosis not present

## 2015-04-28 DIAGNOSIS — I10 Essential (primary) hypertension: Secondary | ICD-10-CM | POA: Diagnosis not present

## 2015-04-28 DIAGNOSIS — I509 Heart failure, unspecified: Secondary | ICD-10-CM | POA: Diagnosis not present

## 2015-04-28 DIAGNOSIS — N390 Urinary tract infection, site not specified: Secondary | ICD-10-CM | POA: Diagnosis not present

## 2015-04-28 DIAGNOSIS — R531 Weakness: Secondary | ICD-10-CM | POA: Diagnosis not present

## 2015-04-28 DIAGNOSIS — Z79899 Other long term (current) drug therapy: Secondary | ICD-10-CM | POA: Diagnosis not present

## 2015-04-28 DIAGNOSIS — K219 Gastro-esophageal reflux disease without esophagitis: Secondary | ICD-10-CM | POA: Diagnosis not present

## 2015-04-28 DIAGNOSIS — Z79891 Long term (current) use of opiate analgesic: Secondary | ICD-10-CM | POA: Diagnosis not present

## 2015-05-07 DIAGNOSIS — F319 Bipolar disorder, unspecified: Secondary | ICD-10-CM | POA: Diagnosis not present

## 2015-05-07 DIAGNOSIS — K7689 Other specified diseases of liver: Secondary | ICD-10-CM | POA: Diagnosis not present

## 2015-05-07 DIAGNOSIS — I509 Heart failure, unspecified: Secondary | ICD-10-CM | POA: Diagnosis not present

## 2015-05-07 DIAGNOSIS — K219 Gastro-esophageal reflux disease without esophagitis: Secondary | ICD-10-CM | POA: Diagnosis not present

## 2015-05-07 DIAGNOSIS — R1084 Generalized abdominal pain: Secondary | ICD-10-CM | POA: Diagnosis not present

## 2015-05-07 DIAGNOSIS — N3000 Acute cystitis without hematuria: Secondary | ICD-10-CM | POA: Diagnosis not present

## 2015-05-07 DIAGNOSIS — R1030 Lower abdominal pain, unspecified: Secondary | ICD-10-CM | POA: Diagnosis not present

## 2015-05-07 DIAGNOSIS — M1990 Unspecified osteoarthritis, unspecified site: Secondary | ICD-10-CM | POA: Diagnosis not present

## 2015-05-07 DIAGNOSIS — K5900 Constipation, unspecified: Secondary | ICD-10-CM | POA: Diagnosis not present

## 2015-05-07 DIAGNOSIS — I10 Essential (primary) hypertension: Secondary | ICD-10-CM | POA: Diagnosis not present

## 2015-05-14 DIAGNOSIS — Z79899 Other long term (current) drug therapy: Secondary | ICD-10-CM | POA: Diagnosis not present

## 2015-05-14 DIAGNOSIS — R109 Unspecified abdominal pain: Secondary | ICD-10-CM | POA: Diagnosis not present

## 2015-05-14 DIAGNOSIS — R102 Pelvic and perineal pain: Secondary | ICD-10-CM | POA: Diagnosis not present

## 2015-05-14 DIAGNOSIS — N3000 Acute cystitis without hematuria: Secondary | ICD-10-CM | POA: Diagnosis not present

## 2015-05-14 DIAGNOSIS — G8929 Other chronic pain: Secondary | ICD-10-CM | POA: Diagnosis not present

## 2015-05-14 DIAGNOSIS — Z792 Long term (current) use of antibiotics: Secondary | ICD-10-CM | POA: Diagnosis not present

## 2015-05-14 DIAGNOSIS — K5900 Constipation, unspecified: Secondary | ICD-10-CM | POA: Diagnosis not present

## 2015-05-14 DIAGNOSIS — I10 Essential (primary) hypertension: Secondary | ICD-10-CM | POA: Diagnosis not present

## 2015-05-19 DIAGNOSIS — Z79899 Other long term (current) drug therapy: Secondary | ICD-10-CM | POA: Diagnosis not present

## 2015-05-21 DIAGNOSIS — R0789 Other chest pain: Secondary | ICD-10-CM | POA: Diagnosis not present

## 2015-05-21 DIAGNOSIS — R079 Chest pain, unspecified: Secondary | ICD-10-CM | POA: Diagnosis not present

## 2015-05-21 DIAGNOSIS — M79661 Pain in right lower leg: Secondary | ICD-10-CM | POA: Diagnosis not present

## 2015-05-21 DIAGNOSIS — M79604 Pain in right leg: Secondary | ICD-10-CM | POA: Diagnosis not present

## 2015-05-24 DIAGNOSIS — Z79899 Other long term (current) drug therapy: Secondary | ICD-10-CM | POA: Diagnosis not present

## 2015-05-24 DIAGNOSIS — F319 Bipolar disorder, unspecified: Secondary | ICD-10-CM | POA: Diagnosis not present

## 2015-05-24 DIAGNOSIS — I509 Heart failure, unspecified: Secondary | ICD-10-CM | POA: Diagnosis not present

## 2015-05-24 DIAGNOSIS — M5416 Radiculopathy, lumbar region: Secondary | ICD-10-CM | POA: Diagnosis not present

## 2015-05-24 DIAGNOSIS — M4726 Other spondylosis with radiculopathy, lumbar region: Secondary | ICD-10-CM | POA: Diagnosis not present

## 2015-05-24 DIAGNOSIS — M79605 Pain in left leg: Secondary | ICD-10-CM | POA: Diagnosis not present

## 2015-05-24 DIAGNOSIS — M5136 Other intervertebral disc degeneration, lumbar region: Secondary | ICD-10-CM | POA: Diagnosis not present

## 2015-05-24 DIAGNOSIS — M79604 Pain in right leg: Secondary | ICD-10-CM | POA: Diagnosis not present

## 2015-05-25 DIAGNOSIS — I10 Essential (primary) hypertension: Secondary | ICD-10-CM | POA: Diagnosis not present

## 2015-05-25 DIAGNOSIS — M5134 Other intervertebral disc degeneration, thoracic region: Secondary | ICD-10-CM | POA: Diagnosis not present

## 2015-05-25 DIAGNOSIS — E669 Obesity, unspecified: Secondary | ICD-10-CM | POA: Diagnosis not present

## 2015-05-25 DIAGNOSIS — M159 Polyosteoarthritis, unspecified: Secondary | ICD-10-CM | POA: Diagnosis not present

## 2015-05-26 DIAGNOSIS — F4312 Post-traumatic stress disorder, chronic: Secondary | ICD-10-CM | POA: Diagnosis not present

## 2015-05-26 DIAGNOSIS — F900 Attention-deficit hyperactivity disorder, predominantly inattentive type: Secondary | ICD-10-CM | POA: Diagnosis not present

## 2015-05-26 DIAGNOSIS — F3132 Bipolar disorder, current episode depressed, moderate: Secondary | ICD-10-CM | POA: Diagnosis not present

## 2015-05-28 DIAGNOSIS — M25561 Pain in right knee: Secondary | ICD-10-CM | POA: Diagnosis not present

## 2015-05-28 DIAGNOSIS — F319 Bipolar disorder, unspecified: Secondary | ICD-10-CM | POA: Diagnosis not present

## 2015-05-28 DIAGNOSIS — Z79899 Other long term (current) drug therapy: Secondary | ICD-10-CM | POA: Diagnosis not present

## 2015-05-28 DIAGNOSIS — M5127 Other intervertebral disc displacement, lumbosacral region: Secondary | ICD-10-CM | POA: Diagnosis not present

## 2015-05-28 DIAGNOSIS — M5186 Other intervertebral disc disorders, lumbar region: Secondary | ICD-10-CM | POA: Diagnosis not present

## 2015-05-28 DIAGNOSIS — I10 Essential (primary) hypertension: Secondary | ICD-10-CM | POA: Diagnosis not present

## 2015-05-28 DIAGNOSIS — I509 Heart failure, unspecified: Secondary | ICD-10-CM | POA: Diagnosis not present

## 2015-05-28 DIAGNOSIS — M5136 Other intervertebral disc degeneration, lumbar region: Secondary | ICD-10-CM | POA: Diagnosis not present

## 2015-05-28 DIAGNOSIS — G8929 Other chronic pain: Secondary | ICD-10-CM | POA: Diagnosis not present

## 2015-05-31 DIAGNOSIS — M549 Dorsalgia, unspecified: Secondary | ICD-10-CM | POA: Diagnosis not present

## 2015-05-31 DIAGNOSIS — G894 Chronic pain syndrome: Secondary | ICD-10-CM | POA: Diagnosis not present

## 2015-05-31 DIAGNOSIS — Z7289 Other problems related to lifestyle: Secondary | ICD-10-CM | POA: Diagnosis not present

## 2015-05-31 DIAGNOSIS — Z760 Encounter for issue of repeat prescription: Secondary | ICD-10-CM | POA: Diagnosis not present

## 2015-05-31 DIAGNOSIS — I10 Essential (primary) hypertension: Secondary | ICD-10-CM | POA: Diagnosis not present

## 2015-05-31 DIAGNOSIS — M545 Low back pain: Secondary | ICD-10-CM | POA: Diagnosis not present

## 2015-05-31 DIAGNOSIS — M25561 Pain in right knee: Secondary | ICD-10-CM | POA: Diagnosis not present

## 2015-05-31 DIAGNOSIS — M25562 Pain in left knee: Secondary | ICD-10-CM | POA: Diagnosis not present

## 2015-06-01 DIAGNOSIS — M545 Low back pain: Secondary | ICD-10-CM | POA: Diagnosis not present

## 2015-06-01 DIAGNOSIS — Z79899 Other long term (current) drug therapy: Secondary | ICD-10-CM | POA: Diagnosis not present

## 2015-06-01 DIAGNOSIS — I10 Essential (primary) hypertension: Secondary | ICD-10-CM | POA: Diagnosis not present

## 2015-06-01 DIAGNOSIS — I509 Heart failure, unspecified: Secondary | ICD-10-CM | POA: Diagnosis not present

## 2015-06-01 DIAGNOSIS — G894 Chronic pain syndrome: Secondary | ICD-10-CM | POA: Diagnosis not present

## 2015-06-01 DIAGNOSIS — M549 Dorsalgia, unspecified: Secondary | ICD-10-CM | POA: Diagnosis not present

## 2015-06-03 DIAGNOSIS — M5116 Intervertebral disc disorders with radiculopathy, lumbar region: Secondary | ICD-10-CM | POA: Diagnosis not present

## 2015-06-10 DIAGNOSIS — I509 Heart failure, unspecified: Secondary | ICD-10-CM | POA: Diagnosis not present

## 2015-06-10 DIAGNOSIS — Z90710 Acquired absence of both cervix and uterus: Secondary | ICD-10-CM | POA: Diagnosis not present

## 2015-06-10 DIAGNOSIS — M5432 Sciatica, left side: Secondary | ICD-10-CM | POA: Diagnosis not present

## 2015-06-10 DIAGNOSIS — F319 Bipolar disorder, unspecified: Secondary | ICD-10-CM | POA: Diagnosis not present

## 2015-06-10 DIAGNOSIS — G894 Chronic pain syndrome: Secondary | ICD-10-CM | POA: Diagnosis not present

## 2015-06-10 DIAGNOSIS — Z6841 Body Mass Index (BMI) 40.0 and over, adult: Secondary | ICD-10-CM | POA: Diagnosis not present

## 2015-06-10 DIAGNOSIS — Z880 Allergy status to penicillin: Secondary | ICD-10-CM | POA: Diagnosis not present

## 2015-06-10 DIAGNOSIS — Z9049 Acquired absence of other specified parts of digestive tract: Secondary | ICD-10-CM | POA: Diagnosis not present

## 2015-06-10 DIAGNOSIS — I10 Essential (primary) hypertension: Secondary | ICD-10-CM | POA: Diagnosis not present

## 2015-06-10 DIAGNOSIS — M5430 Sciatica, unspecified side: Secondary | ICD-10-CM | POA: Diagnosis not present

## 2015-06-10 DIAGNOSIS — M519 Unspecified thoracic, thoracolumbar and lumbosacral intervertebral disc disorder: Secondary | ICD-10-CM | POA: Diagnosis not present

## 2015-06-15 DIAGNOSIS — J3489 Other specified disorders of nose and nasal sinuses: Secondary | ICD-10-CM | POA: Diagnosis not present

## 2015-06-15 DIAGNOSIS — S0992XA Unspecified injury of nose, initial encounter: Secondary | ICD-10-CM | POA: Diagnosis not present

## 2015-06-15 DIAGNOSIS — S0033XA Contusion of nose, initial encounter: Secondary | ICD-10-CM | POA: Diagnosis not present

## 2015-06-16 DIAGNOSIS — M5126 Other intervertebral disc displacement, lumbar region: Secondary | ICD-10-CM | POA: Diagnosis not present

## 2015-06-16 DIAGNOSIS — M5116 Intervertebral disc disorders with radiculopathy, lumbar region: Secondary | ICD-10-CM | POA: Diagnosis not present

## 2015-06-16 DIAGNOSIS — M5136 Other intervertebral disc degeneration, lumbar region: Secondary | ICD-10-CM | POA: Diagnosis not present

## 2015-06-16 DIAGNOSIS — M545 Low back pain: Secondary | ICD-10-CM | POA: Diagnosis not present

## 2015-06-23 DIAGNOSIS — F4312 Post-traumatic stress disorder, chronic: Secondary | ICD-10-CM | POA: Diagnosis not present

## 2015-06-23 DIAGNOSIS — F3132 Bipolar disorder, current episode depressed, moderate: Secondary | ICD-10-CM | POA: Diagnosis not present

## 2015-06-23 DIAGNOSIS — F900 Attention-deficit hyperactivity disorder, predominantly inattentive type: Secondary | ICD-10-CM | POA: Diagnosis not present

## 2015-06-28 DIAGNOSIS — M4800 Spinal stenosis, site unspecified: Secondary | ICD-10-CM | POA: Diagnosis not present

## 2015-06-28 DIAGNOSIS — F900 Attention-deficit hyperactivity disorder, predominantly inattentive type: Secondary | ICD-10-CM | POA: Diagnosis not present

## 2015-06-28 DIAGNOSIS — F3132 Bipolar disorder, current episode depressed, moderate: Secondary | ICD-10-CM | POA: Diagnosis not present

## 2015-06-28 DIAGNOSIS — Z79899 Other long term (current) drug therapy: Secondary | ICD-10-CM | POA: Diagnosis not present

## 2015-06-28 DIAGNOSIS — F4312 Post-traumatic stress disorder, chronic: Secondary | ICD-10-CM | POA: Diagnosis not present

## 2015-06-28 DIAGNOSIS — M5489 Other dorsalgia: Secondary | ICD-10-CM | POA: Diagnosis not present

## 2015-07-01 DIAGNOSIS — I10 Essential (primary) hypertension: Secondary | ICD-10-CM | POA: Diagnosis not present

## 2015-07-01 DIAGNOSIS — G8929 Other chronic pain: Secondary | ICD-10-CM | POA: Diagnosis not present

## 2015-07-01 DIAGNOSIS — M545 Low back pain: Secondary | ICD-10-CM | POA: Diagnosis not present

## 2015-07-01 DIAGNOSIS — I509 Heart failure, unspecified: Secondary | ICD-10-CM | POA: Diagnosis not present

## 2015-07-02 DIAGNOSIS — R109 Unspecified abdominal pain: Secondary | ICD-10-CM | POA: Diagnosis not present

## 2015-07-02 DIAGNOSIS — R319 Hematuria, unspecified: Secondary | ICD-10-CM | POA: Diagnosis not present

## 2015-08-02 DIAGNOSIS — G8929 Other chronic pain: Secondary | ICD-10-CM | POA: Diagnosis not present

## 2015-08-02 DIAGNOSIS — M25561 Pain in right knee: Secondary | ICD-10-CM | POA: Diagnosis not present

## 2015-08-08 DIAGNOSIS — M79604 Pain in right leg: Secondary | ICD-10-CM | POA: Diagnosis not present

## 2015-08-08 DIAGNOSIS — G8929 Other chronic pain: Secondary | ICD-10-CM | POA: Diagnosis not present

## 2015-08-08 DIAGNOSIS — M25561 Pain in right knee: Secondary | ICD-10-CM | POA: Diagnosis not present

## 2015-08-23 DIAGNOSIS — M25561 Pain in right knee: Secondary | ICD-10-CM | POA: Diagnosis not present

## 2015-08-24 DIAGNOSIS — F900 Attention-deficit hyperactivity disorder, predominantly inattentive type: Secondary | ICD-10-CM | POA: Diagnosis not present

## 2015-08-24 DIAGNOSIS — F4312 Post-traumatic stress disorder, chronic: Secondary | ICD-10-CM | POA: Diagnosis not present

## 2015-08-24 DIAGNOSIS — F3132 Bipolar disorder, current episode depressed, moderate: Secondary | ICD-10-CM | POA: Diagnosis not present

## 2015-08-25 DIAGNOSIS — Z79891 Long term (current) use of opiate analgesic: Secondary | ICD-10-CM | POA: Diagnosis not present

## 2015-08-31 DIAGNOSIS — S0093XA Contusion of unspecified part of head, initial encounter: Secondary | ICD-10-CM | POA: Diagnosis not present

## 2015-08-31 DIAGNOSIS — S0990XA Unspecified injury of head, initial encounter: Secondary | ICD-10-CM | POA: Diagnosis not present

## 2015-08-31 DIAGNOSIS — S199XXA Unspecified injury of neck, initial encounter: Secondary | ICD-10-CM | POA: Diagnosis not present

## 2015-08-31 DIAGNOSIS — M542 Cervicalgia: Secondary | ICD-10-CM | POA: Diagnosis not present

## 2015-08-31 DIAGNOSIS — R51 Headache: Secondary | ICD-10-CM | POA: Diagnosis not present

## 2015-09-09 DIAGNOSIS — D519 Vitamin B12 deficiency anemia, unspecified: Secondary | ICD-10-CM | POA: Diagnosis not present

## 2015-09-09 DIAGNOSIS — J45998 Other asthma: Secondary | ICD-10-CM | POA: Diagnosis not present

## 2015-09-09 DIAGNOSIS — M545 Low back pain: Secondary | ICD-10-CM | POA: Diagnosis not present

## 2015-09-09 DIAGNOSIS — E559 Vitamin D deficiency, unspecified: Secondary | ICD-10-CM | POA: Diagnosis not present

## 2015-09-09 DIAGNOSIS — M5489 Other dorsalgia: Secondary | ICD-10-CM | POA: Diagnosis not present

## 2015-09-20 DIAGNOSIS — N611 Abscess of the breast and nipple: Secondary | ICD-10-CM | POA: Diagnosis not present

## 2015-09-21 DIAGNOSIS — F900 Attention-deficit hyperactivity disorder, predominantly inattentive type: Secondary | ICD-10-CM | POA: Diagnosis not present

## 2015-09-21 DIAGNOSIS — Z79899 Other long term (current) drug therapy: Secondary | ICD-10-CM | POA: Diagnosis not present

## 2015-09-21 DIAGNOSIS — F3132 Bipolar disorder, current episode depressed, moderate: Secondary | ICD-10-CM | POA: Diagnosis not present

## 2015-09-21 DIAGNOSIS — F4312 Post-traumatic stress disorder, chronic: Secondary | ICD-10-CM | POA: Diagnosis not present

## 2015-09-21 DIAGNOSIS — F902 Attention-deficit hyperactivity disorder, combined type: Secondary | ICD-10-CM | POA: Diagnosis not present

## 2015-09-29 DIAGNOSIS — M542 Cervicalgia: Secondary | ICD-10-CM | POA: Diagnosis not present

## 2015-09-29 DIAGNOSIS — S0990XA Unspecified injury of head, initial encounter: Secondary | ICD-10-CM | POA: Diagnosis not present

## 2015-09-29 DIAGNOSIS — M79602 Pain in left arm: Secondary | ICD-10-CM | POA: Diagnosis not present

## 2015-09-29 DIAGNOSIS — S199XXA Unspecified injury of neck, initial encounter: Secondary | ICD-10-CM | POA: Diagnosis not present

## 2015-09-29 DIAGNOSIS — S161XXA Strain of muscle, fascia and tendon at neck level, initial encounter: Secondary | ICD-10-CM | POA: Diagnosis not present

## 2015-09-29 DIAGNOSIS — M545 Low back pain: Secondary | ICD-10-CM | POA: Diagnosis not present

## 2015-09-29 DIAGNOSIS — S40022A Contusion of left upper arm, initial encounter: Secondary | ICD-10-CM | POA: Diagnosis not present

## 2015-10-07 DIAGNOSIS — F3132 Bipolar disorder, current episode depressed, moderate: Secondary | ICD-10-CM | POA: Diagnosis not present

## 2015-10-07 DIAGNOSIS — F900 Attention-deficit hyperactivity disorder, predominantly inattentive type: Secondary | ICD-10-CM | POA: Diagnosis not present

## 2015-10-07 DIAGNOSIS — F4312 Post-traumatic stress disorder, chronic: Secondary | ICD-10-CM | POA: Diagnosis not present

## 2015-10-09 DIAGNOSIS — R0781 Pleurodynia: Secondary | ICD-10-CM | POA: Diagnosis not present

## 2015-10-09 DIAGNOSIS — M5126 Other intervertebral disc displacement, lumbar region: Secondary | ICD-10-CM | POA: Diagnosis not present

## 2015-10-09 DIAGNOSIS — M545 Low back pain: Secondary | ICD-10-CM | POA: Diagnosis not present

## 2015-10-10 DIAGNOSIS — G4700 Insomnia, unspecified: Secondary | ICD-10-CM | POA: Diagnosis not present

## 2015-10-10 DIAGNOSIS — F419 Anxiety disorder, unspecified: Secondary | ICD-10-CM | POA: Diagnosis not present

## 2015-10-10 DIAGNOSIS — Z5181 Encounter for therapeutic drug level monitoring: Secondary | ICD-10-CM | POA: Diagnosis not present

## 2015-10-10 DIAGNOSIS — Z79891 Long term (current) use of opiate analgesic: Secondary | ICD-10-CM | POA: Diagnosis not present

## 2015-10-14 DIAGNOSIS — R030 Elevated blood-pressure reading, without diagnosis of hypertension: Secondary | ICD-10-CM | POA: Diagnosis not present

## 2015-10-14 DIAGNOSIS — R51 Headache: Secondary | ICD-10-CM | POA: Diagnosis not present

## 2015-10-14 DIAGNOSIS — R002 Palpitations: Secondary | ICD-10-CM | POA: Diagnosis not present

## 2015-10-14 DIAGNOSIS — I10 Essential (primary) hypertension: Secondary | ICD-10-CM | POA: Diagnosis not present

## 2015-10-27 DIAGNOSIS — F900 Attention-deficit hyperactivity disorder, predominantly inattentive type: Secondary | ICD-10-CM | POA: Diagnosis not present

## 2015-10-27 DIAGNOSIS — F4312 Post-traumatic stress disorder, chronic: Secondary | ICD-10-CM | POA: Diagnosis not present

## 2015-10-27 DIAGNOSIS — F3132 Bipolar disorder, current episode depressed, moderate: Secondary | ICD-10-CM | POA: Diagnosis not present

## 2015-11-07 DIAGNOSIS — F900 Attention-deficit hyperactivity disorder, predominantly inattentive type: Secondary | ICD-10-CM | POA: Diagnosis not present

## 2015-11-07 DIAGNOSIS — F3132 Bipolar disorder, current episode depressed, moderate: Secondary | ICD-10-CM | POA: Diagnosis not present

## 2015-11-07 DIAGNOSIS — F4312 Post-traumatic stress disorder, chronic: Secondary | ICD-10-CM | POA: Diagnosis not present

## 2015-11-18 DIAGNOSIS — F900 Attention-deficit hyperactivity disorder, predominantly inattentive type: Secondary | ICD-10-CM | POA: Diagnosis not present

## 2015-11-18 DIAGNOSIS — F4312 Post-traumatic stress disorder, chronic: Secondary | ICD-10-CM | POA: Diagnosis not present

## 2015-11-18 DIAGNOSIS — F3132 Bipolar disorder, current episode depressed, moderate: Secondary | ICD-10-CM | POA: Diagnosis not present

## 2015-11-25 DIAGNOSIS — F3132 Bipolar disorder, current episode depressed, moderate: Secondary | ICD-10-CM | POA: Diagnosis not present

## 2015-11-25 DIAGNOSIS — F4312 Post-traumatic stress disorder, chronic: Secondary | ICD-10-CM | POA: Diagnosis not present

## 2015-11-25 DIAGNOSIS — F900 Attention-deficit hyperactivity disorder, predominantly inattentive type: Secondary | ICD-10-CM | POA: Diagnosis not present

## 2015-12-02 DIAGNOSIS — F4310 Post-traumatic stress disorder, unspecified: Secondary | ICD-10-CM | POA: Diagnosis not present

## 2015-12-02 DIAGNOSIS — E785 Hyperlipidemia, unspecified: Secondary | ICD-10-CM | POA: Diagnosis not present

## 2015-12-02 DIAGNOSIS — R072 Precordial pain: Secondary | ICD-10-CM | POA: Diagnosis not present

## 2015-12-02 DIAGNOSIS — R0600 Dyspnea, unspecified: Secondary | ICD-10-CM | POA: Diagnosis not present

## 2015-12-02 DIAGNOSIS — Z23 Encounter for immunization: Secondary | ICD-10-CM | POA: Diagnosis not present

## 2015-12-02 DIAGNOSIS — R0602 Shortness of breath: Secondary | ICD-10-CM | POA: Diagnosis not present

## 2015-12-02 DIAGNOSIS — I10 Essential (primary) hypertension: Secondary | ICD-10-CM | POA: Diagnosis not present

## 2015-12-02 DIAGNOSIS — F310 Bipolar disorder, current episode hypomanic: Secondary | ICD-10-CM | POA: Diagnosis not present

## 2015-12-02 DIAGNOSIS — E782 Mixed hyperlipidemia: Secondary | ICD-10-CM | POA: Diagnosis not present

## 2015-12-02 DIAGNOSIS — R079 Chest pain, unspecified: Secondary | ICD-10-CM | POA: Diagnosis not present

## 2015-12-02 DIAGNOSIS — Z79899 Other long term (current) drug therapy: Secondary | ICD-10-CM | POA: Diagnosis not present

## 2015-12-02 DIAGNOSIS — F900 Attention-deficit hyperactivity disorder, predominantly inattentive type: Secondary | ICD-10-CM | POA: Diagnosis not present

## 2015-12-03 DIAGNOSIS — R079 Chest pain, unspecified: Secondary | ICD-10-CM | POA: Diagnosis not present

## 2015-12-08 DIAGNOSIS — E7800 Pure hypercholesterolemia, unspecified: Secondary | ICD-10-CM | POA: Diagnosis not present

## 2015-12-08 DIAGNOSIS — G8929 Other chronic pain: Secondary | ICD-10-CM | POA: Diagnosis not present

## 2015-12-08 DIAGNOSIS — R0602 Shortness of breath: Secondary | ICD-10-CM | POA: Diagnosis not present

## 2015-12-08 DIAGNOSIS — K219 Gastro-esophageal reflux disease without esophagitis: Secondary | ICD-10-CM | POA: Diagnosis not present

## 2015-12-08 DIAGNOSIS — R600 Localized edema: Secondary | ICD-10-CM | POA: Diagnosis not present

## 2015-12-08 DIAGNOSIS — Z79899 Other long term (current) drug therapy: Secondary | ICD-10-CM | POA: Diagnosis not present

## 2015-12-08 DIAGNOSIS — I10 Essential (primary) hypertension: Secondary | ICD-10-CM | POA: Diagnosis not present

## 2015-12-08 DIAGNOSIS — R0600 Dyspnea, unspecified: Secondary | ICD-10-CM | POA: Diagnosis not present

## 2015-12-08 DIAGNOSIS — M25561 Pain in right knee: Secondary | ICD-10-CM | POA: Diagnosis not present

## 2015-12-08 DIAGNOSIS — I509 Heart failure, unspecified: Secondary | ICD-10-CM | POA: Diagnosis not present

## 2015-12-12 DIAGNOSIS — M5416 Radiculopathy, lumbar region: Secondary | ICD-10-CM | POA: Diagnosis not present

## 2015-12-12 DIAGNOSIS — M79606 Pain in leg, unspecified: Secondary | ICD-10-CM | POA: Diagnosis not present

## 2015-12-12 DIAGNOSIS — R52 Pain, unspecified: Secondary | ICD-10-CM | POA: Diagnosis not present

## 2015-12-12 DIAGNOSIS — M79604 Pain in right leg: Secondary | ICD-10-CM | POA: Diagnosis not present

## 2015-12-15 DIAGNOSIS — M545 Low back pain: Secondary | ICD-10-CM | POA: Diagnosis not present

## 2015-12-15 DIAGNOSIS — M129 Arthropathy, unspecified: Secondary | ICD-10-CM | POA: Diagnosis not present

## 2015-12-15 DIAGNOSIS — M5136 Other intervertebral disc degeneration, lumbar region: Secondary | ICD-10-CM | POA: Diagnosis not present

## 2015-12-15 DIAGNOSIS — G894 Chronic pain syndrome: Secondary | ICD-10-CM | POA: Diagnosis not present

## 2015-12-21 DIAGNOSIS — M79661 Pain in right lower leg: Secondary | ICD-10-CM | POA: Diagnosis not present

## 2015-12-21 DIAGNOSIS — M79604 Pain in right leg: Secondary | ICD-10-CM | POA: Diagnosis not present

## 2015-12-27 DIAGNOSIS — F4310 Post-traumatic stress disorder, unspecified: Secondary | ICD-10-CM | POA: Diagnosis not present

## 2015-12-30 DIAGNOSIS — Z79899 Other long term (current) drug therapy: Secondary | ICD-10-CM | POA: Diagnosis not present

## 2015-12-30 DIAGNOSIS — I252 Old myocardial infarction: Secondary | ICD-10-CM | POA: Diagnosis not present

## 2015-12-30 DIAGNOSIS — J209 Acute bronchitis, unspecified: Secondary | ICD-10-CM | POA: Diagnosis not present

## 2015-12-30 DIAGNOSIS — I10 Essential (primary) hypertension: Secondary | ICD-10-CM | POA: Diagnosis not present

## 2015-12-30 DIAGNOSIS — E7800 Pure hypercholesterolemia, unspecified: Secondary | ICD-10-CM | POA: Diagnosis not present

## 2016-01-10 DIAGNOSIS — G894 Chronic pain syndrome: Secondary | ICD-10-CM | POA: Diagnosis not present

## 2016-01-11 DIAGNOSIS — M5441 Lumbago with sciatica, right side: Secondary | ICD-10-CM | POA: Diagnosis not present

## 2016-01-11 DIAGNOSIS — R200 Anesthesia of skin: Secondary | ICD-10-CM | POA: Diagnosis not present

## 2016-01-11 DIAGNOSIS — M1990 Unspecified osteoarthritis, unspecified site: Secondary | ICD-10-CM | POA: Diagnosis not present

## 2016-01-11 DIAGNOSIS — F319 Bipolar disorder, unspecified: Secondary | ICD-10-CM | POA: Diagnosis not present

## 2016-01-11 DIAGNOSIS — F909 Attention-deficit hyperactivity disorder, unspecified type: Secondary | ICD-10-CM | POA: Diagnosis not present

## 2016-01-11 DIAGNOSIS — I10 Essential (primary) hypertension: Secondary | ICD-10-CM | POA: Diagnosis not present

## 2016-01-28 ENCOUNTER — Encounter (HOSPITAL_COMMUNITY): Payer: Self-pay | Admitting: Emergency Medicine

## 2016-01-28 ENCOUNTER — Emergency Department (HOSPITAL_COMMUNITY)
Admission: EM | Admit: 2016-01-28 | Discharge: 2016-01-28 | Disposition: A | Discharge status: Home / Self Care | Payer: Medicare Other | Attending: Emergency Medicine | Admitting: Emergency Medicine

## 2016-01-28 ENCOUNTER — Emergency Department (HOSPITAL_COMMUNITY): Payer: Medicare Other

## 2016-01-28 DIAGNOSIS — M1990 Unspecified osteoarthritis, unspecified site: Secondary | ICD-10-CM | POA: Diagnosis not present

## 2016-01-28 DIAGNOSIS — I10 Essential (primary) hypertension: Secondary | ICD-10-CM | POA: Insufficient documentation

## 2016-01-28 DIAGNOSIS — M179 Osteoarthritis of knee, unspecified: Secondary | ICD-10-CM | POA: Diagnosis not present

## 2016-01-28 DIAGNOSIS — M25561 Pain in right knee: Secondary | ICD-10-CM | POA: Diagnosis present

## 2016-01-28 DIAGNOSIS — M25461 Effusion, right knee: Secondary | ICD-10-CM | POA: Diagnosis not present

## 2016-01-28 DIAGNOSIS — M1710 Unilateral primary osteoarthritis, unspecified knee: Secondary | ICD-10-CM

## 2016-01-28 DIAGNOSIS — F319 Bipolar disorder, unspecified: Secondary | ICD-10-CM | POA: Diagnosis not present

## 2016-01-28 DIAGNOSIS — M1711 Unilateral primary osteoarthritis, right knee: Secondary | ICD-10-CM | POA: Insufficient documentation

## 2016-01-28 DIAGNOSIS — I509 Heart failure, unspecified: Secondary | ICD-10-CM | POA: Diagnosis not present

## 2016-01-28 DIAGNOSIS — Z79899 Other long term (current) drug therapy: Secondary | ICD-10-CM | POA: Insufficient documentation

## 2016-01-28 MED ORDER — PREDNISONE 20 MG PO TABS: ORAL_TABLET | ORAL | Status: DC

## 2016-01-28 MED ORDER — PREDNISONE 20 MG PO TABS
60.0000 mg | ORAL_TABLET | Freq: Once | ORAL | Status: AC
  Administered 2016-01-28: 60 mg via ORAL
  Filled 2016-01-28: qty 3

## 2016-01-28 NOTE — ED Notes (Signed)
Pt reports R leg swelling for the past month. Also reports R leg pain that is worse with ambulation. Knee has popping sound when extended.

## 2016-01-28 NOTE — ED Provider Notes (Signed)
CSN: [id number]     Arrival date & time 01/28/16  1235 History   First MD Initiated Contact with Patient 01/28/16 1517     Chief Complaint  Patient presents with  . Knee Pain  . Leg Swelling     (Consider location/radiation/quality/duration/timing/severity/associated sxs/prior Treatment) Patient is a 50 y.o. female presenting with knee pain. The history is provided by the patient.  Knee Pain Location:  Knee Injury: no   Knee location:  R knee Pain details:    Quality:  Aching   Radiates to:  R leg   Severity:  Moderate   Onset quality:  Gradual   Timing:  Constant   Progression:  Worsening Chronicity:  Chronic Dislocation: no   Prior injury to area:  No Relieved by:  Nothing Worsened by:  Activity Ineffective treatments:  NSAIDs Associated symptoms: no fever   Associated symptoms comment:  Hip and lower leg pain    Past Medical History  Diagnosis Date  . Hypertension   . CHF (congestive heart failure) (HCC)   . Arthritis   . Bipolar disorder (HCC)   . Degenerative disc disease    Past Surgical History  Procedure Laterality Date  . Cholecystectomy    . Appendectomy    . Abdominal hysterectomy     History reviewed. No pertinent family history. Social History  Substance Use Topics  . Smoking status: Never Smoker   . Smokeless tobacco: None  . Alcohol Use: No   OB History    No data available     Review of Systems  Constitutional: Negative for fever.  Respiratory: Negative for shortness of breath.   Musculoskeletal: Positive for arthralgias. Negative for joint swelling.  Neurological: Negative for numbness.  All other systems reviewed and are negative.     Allergies  Review of patient's allergies indicates no known allergies.  Home Medications   Prior to Admission medications   Medication Sig Start Date End Date Taking? Authorizing Provider  ALPRAZolam Prudy Feeler) 1 MG tablet Take 0.5-1 mg by mouth 4 (four) times daily. Take 1 tablet (1 mg) daily 6am,  12pm, 4pm and take 1/2 tablet (0.5 mg) at 9pm    Historical Provider, MD  ARIPiprazole (ABILIFY) 10 MG tablet Take 10 mg by mouth at bedtime.    Historical Provider, MD  asenapine (SAPHRIS) 5 MG SUBL Place 5 mg under the tongue at bedtime.     Historical Provider, MD  Aspirin-Acetaminophen-Caffeine (GOODY HEADACHE PO) Take 1 packet by mouth 2 (two) times daily as needed (back pain).    Historical Provider, MD  ciprofloxacin (CIPRO) 500 MG tablet Take 500 mg by mouth 2 (two) times daily. 14 day course completed 01/01/14 12/18/13   Historical Provider, MD  dicyclomine (BENTYL) 10 MG capsule Take 10 mg by mouth 2 (two) times daily.  12/18/13   Historical Provider, MD  DULoxetine (CYMBALTA) 30 MG capsule Take 90 mg by mouth daily.    Historical Provider, MD  esomeprazole (NEXIUM) 40 MG capsule Take 40 mg by mouth daily before breakfast.    Historical Provider, MD  furosemide (LASIX) 20 MG tablet Take 20 mg by mouth daily.    Historical Provider, MD  HYDROcodone-acetaminophen (NORCO/VICODIN) 5-325 MG per tablet Take 1 tablet by mouth every 4 (four) hours as needed (back pain and/or migraines).  01/06/14   Historical Provider, MD  lisinopril-hydrochlorothiazide (PRINZIDE,ZESTORETIC) 20-12.5 MG per tablet Take 1 tablet by mouth daily.  12/19/13   Historical Provider, MD  oxcarbazepine (TRILEPTAL) 600 MG tablet Take  600 mg by mouth 2 (two) times daily.    Historical Provider, MD  predniSONE (DELTASONE) 20 MG tablet 3 Tabs PO Days 1-3, then 2 tabs PO Days 4-6, then 1 tab PO Day 7-9, then Half Tab PO Day 10-12 01/28/16   Earley Favor, NP  promethazine (PHENERGAN) 25 MG tablet Take 25 mg by mouth daily.    Historical Provider, MD  traZODone (DESYREL) 150 MG tablet Take 150 mg by mouth at bedtime.    Historical Provider, MD   BP 127/77 mmHg  Pulse 70  Temp(Src) 98.9 F (37.2 C) (Oral)  Resp 16  SpO2 96% Physical Exam  Constitutional: She appears well-developed and well-nourished.  Morbidly obese  HENT:  Head:  Normocephalic.  Eyes: Pupils are equal, round, and reactive to light.  Neck: Normal range of motion.  Cardiovascular: Normal rate.   Pulmonary/Chest: Effort normal.  Musculoskeletal: She exhibits tenderness. She exhibits no edema.  Full ROM, no change in color, temperature, sensation of leg from hip to ankle   Neurological: She is alert.  Skin: Skin is warm.  Nursing note and vitals reviewed.   ED Course  Procedures (including critical care time) Labs Review Labs Reviewed - No data to display  Imaging Review Dg Knee Complete 4 Views Right  01/28/2016  CLINICAL DATA:  50 year old presenting with 1 month history of progressively worsening right knee pain associated with crepitus. EXAM: RIGHT KNEE - COMPLETE 4+ VIEW COMPARISON:  08/02/2015, 11/06/2014. FINDINGS: No evidence of acute or subacute fracture or dislocation. Severe medial compartment joint space narrowing and associated hypertrophic spurring and mild lateral compartment joint space narrowing with associated hypertrophic spurring, unchanged since the examination 6 months ago. Patellofemoral compartment joint space well preserved. Enthesopathic spurring at the insertion of the quadriceps tendon on the superior patella, unchanged. Small joint effusion. Well-preserved bone mineral density. IMPRESSION: Severe medial compartment osteoarthritis and mild lateral compartment osteoarthritis, stable since the examination 6 months ago. No acute or subacute osseous abnormality. Small joint effusion. Electronically Signed   By: Hulan Saas M.D.   On: 01/28/2016 13:26   I have personally reviewed and evaluated these images and lab results as part of my medical decision-making.   EKG Interpretation None    X ray shows worsening osteoarthritis  Will Rx Prednisone- will continue Vicodan, tylenol PRN and referred to orthopedics for alternative treatments   MDM   Final diagnoses:  Osteoarthritis of knee, unilateral         Earley Favor,  NP 01/28/16 1538  Lorre Nick, MD 02/01/16 [phone]

## 2016-01-28 NOTE — Discharge Instructions (Signed)
Make an appointment with Dr. Carola Frost for further evaluation and treatment  Discuss treatment options

## 2016-01-28 NOTE — ED Notes (Signed)
NP at bedside.

## 2016-02-01 DIAGNOSIS — M25561 Pain in right knee: Secondary | ICD-10-CM | POA: Diagnosis not present

## 2016-02-02 DIAGNOSIS — M25569 Pain in unspecified knee: Secondary | ICD-10-CM

## 2016-02-02 DIAGNOSIS — G8929 Other chronic pain: Secondary | ICD-10-CM | POA: Diagnosis not present

## 2016-02-02 DIAGNOSIS — M25561 Pain in right knee: Secondary | ICD-10-CM | POA: Diagnosis not present

## 2016-02-02 HISTORY — DX: Pain in unspecified knee: M25.569

## 2016-02-07 DIAGNOSIS — G894 Chronic pain syndrome: Secondary | ICD-10-CM | POA: Diagnosis not present

## 2016-02-09 DIAGNOSIS — G8929 Other chronic pain: Secondary | ICD-10-CM | POA: Diagnosis not present

## 2016-02-09 DIAGNOSIS — M25561 Pain in right knee: Secondary | ICD-10-CM | POA: Diagnosis not present

## 2016-02-09 DIAGNOSIS — M1711 Unilateral primary osteoarthritis, right knee: Secondary | ICD-10-CM | POA: Diagnosis not present

## 2016-02-13 DIAGNOSIS — M1711 Unilateral primary osteoarthritis, right knee: Secondary | ICD-10-CM | POA: Diagnosis not present

## 2016-02-19 DIAGNOSIS — R079 Chest pain, unspecified: Secondary | ICD-10-CM | POA: Diagnosis not present

## 2016-02-19 DIAGNOSIS — R002 Palpitations: Secondary | ICD-10-CM | POA: Diagnosis not present

## 2016-02-19 DIAGNOSIS — R072 Precordial pain: Secondary | ICD-10-CM | POA: Diagnosis not present

## 2016-03-05 DIAGNOSIS — M1711 Unilateral primary osteoarthritis, right knee: Secondary | ICD-10-CM | POA: Diagnosis not present

## 2016-03-06 DIAGNOSIS — G894 Chronic pain syndrome: Secondary | ICD-10-CM | POA: Diagnosis not present

## 2016-03-13 DIAGNOSIS — G8929 Other chronic pain: Secondary | ICD-10-CM | POA: Diagnosis not present

## 2016-03-13 DIAGNOSIS — I110 Hypertensive heart disease with heart failure: Secondary | ICD-10-CM | POA: Diagnosis not present

## 2016-03-13 DIAGNOSIS — Z79899 Other long term (current) drug therapy: Secondary | ICD-10-CM | POA: Diagnosis not present

## 2016-03-13 DIAGNOSIS — I509 Heart failure, unspecified: Secondary | ICD-10-CM | POA: Diagnosis not present

## 2016-03-13 DIAGNOSIS — M25561 Pain in right knee: Secondary | ICD-10-CM | POA: Diagnosis not present

## 2016-03-29 DIAGNOSIS — M25561 Pain in right knee: Secondary | ICD-10-CM | POA: Diagnosis not present

## 2016-03-29 DIAGNOSIS — M179 Osteoarthritis of knee, unspecified: Secondary | ICD-10-CM | POA: Diagnosis not present

## 2016-03-29 DIAGNOSIS — M1711 Unilateral primary osteoarthritis, right knee: Secondary | ICD-10-CM | POA: Diagnosis not present

## 2016-03-29 DIAGNOSIS — G8929 Other chronic pain: Secondary | ICD-10-CM | POA: Diagnosis not present

## 2016-05-12 DIAGNOSIS — H1032 Unspecified acute conjunctivitis, left eye: Secondary | ICD-10-CM | POA: Diagnosis not present

## 2016-05-17 DIAGNOSIS — Z9049 Acquired absence of other specified parts of digestive tract: Secondary | ICD-10-CM | POA: Diagnosis not present

## 2016-05-17 DIAGNOSIS — M25561 Pain in right knee: Secondary | ICD-10-CM | POA: Diagnosis not present

## 2016-05-17 DIAGNOSIS — I110 Hypertensive heart disease with heart failure: Secondary | ICD-10-CM | POA: Diagnosis not present

## 2016-05-17 DIAGNOSIS — Z90710 Acquired absence of both cervix and uterus: Secondary | ICD-10-CM | POA: Diagnosis not present

## 2016-05-17 DIAGNOSIS — M1711 Unilateral primary osteoarthritis, right knee: Secondary | ICD-10-CM | POA: Diagnosis not present

## 2016-05-17 DIAGNOSIS — I509 Heart failure, unspecified: Secondary | ICD-10-CM | POA: Diagnosis not present

## 2016-05-17 DIAGNOSIS — M549 Dorsalgia, unspecified: Secondary | ICD-10-CM | POA: Diagnosis not present

## 2016-05-17 DIAGNOSIS — F319 Bipolar disorder, unspecified: Secondary | ICD-10-CM | POA: Diagnosis not present

## 2016-05-17 DIAGNOSIS — M25461 Effusion, right knee: Secondary | ICD-10-CM | POA: Diagnosis not present

## 2016-05-17 DIAGNOSIS — M7989 Other specified soft tissue disorders: Secondary | ICD-10-CM | POA: Diagnosis not present

## 2016-05-17 DIAGNOSIS — G8929 Other chronic pain: Secondary | ICD-10-CM | POA: Diagnosis not present

## 2016-05-21 DIAGNOSIS — G894 Chronic pain syndrome: Secondary | ICD-10-CM | POA: Diagnosis not present

## 2016-06-08 DIAGNOSIS — F315 Bipolar disorder, current episode depressed, severe, with psychotic features: Secondary | ICD-10-CM | POA: Diagnosis not present

## 2016-06-12 DIAGNOSIS — F315 Bipolar disorder, current episode depressed, severe, with psychotic features: Secondary | ICD-10-CM | POA: Diagnosis not present

## 2016-06-23 DIAGNOSIS — S39012A Strain of muscle, fascia and tendon of lower back, initial encounter: Secondary | ICD-10-CM | POA: Diagnosis not present

## 2016-06-23 DIAGNOSIS — S335XXA Sprain of ligaments of lumbar spine, initial encounter: Secondary | ICD-10-CM | POA: Diagnosis not present

## 2016-06-23 DIAGNOSIS — S29012A Strain of muscle and tendon of back wall of thorax, initial encounter: Secondary | ICD-10-CM | POA: Diagnosis not present

## 2016-06-28 DIAGNOSIS — G894 Chronic pain syndrome: Secondary | ICD-10-CM | POA: Diagnosis not present

## 2016-07-12 DIAGNOSIS — M1711 Unilateral primary osteoarthritis, right knee: Secondary | ICD-10-CM | POA: Diagnosis not present

## 2016-07-15 DIAGNOSIS — I509 Heart failure, unspecified: Secondary | ICD-10-CM | POA: Diagnosis not present

## 2016-07-15 DIAGNOSIS — R05 Cough: Secondary | ICD-10-CM | POA: Diagnosis not present

## 2016-07-15 DIAGNOSIS — R0602 Shortness of breath: Secondary | ICD-10-CM | POA: Diagnosis not present

## 2016-07-15 DIAGNOSIS — J069 Acute upper respiratory infection, unspecified: Secondary | ICD-10-CM | POA: Diagnosis not present

## 2016-07-15 DIAGNOSIS — J209 Acute bronchitis, unspecified: Secondary | ICD-10-CM | POA: Diagnosis not present

## 2016-07-15 DIAGNOSIS — Z79899 Other long term (current) drug therapy: Secondary | ICD-10-CM | POA: Diagnosis not present

## 2016-07-26 DIAGNOSIS — G894 Chronic pain syndrome: Secondary | ICD-10-CM | POA: Diagnosis not present

## 2016-07-26 DIAGNOSIS — I973 Postprocedural hypertension: Secondary | ICD-10-CM | POA: Diagnosis not present

## 2016-07-26 DIAGNOSIS — F419 Anxiety disorder, unspecified: Secondary | ICD-10-CM | POA: Diagnosis not present

## 2016-07-26 DIAGNOSIS — F339 Major depressive disorder, recurrent, unspecified: Secondary | ICD-10-CM | POA: Diagnosis not present

## 2016-08-08 DIAGNOSIS — M25551 Pain in right hip: Secondary | ICD-10-CM | POA: Diagnosis not present

## 2016-08-08 DIAGNOSIS — M25552 Pain in left hip: Secondary | ICD-10-CM | POA: Diagnosis not present

## 2016-08-08 DIAGNOSIS — M79662 Pain in left lower leg: Secondary | ICD-10-CM | POA: Diagnosis not present

## 2016-08-08 DIAGNOSIS — I509 Heart failure, unspecified: Secondary | ICD-10-CM | POA: Diagnosis not present

## 2016-08-08 DIAGNOSIS — M79661 Pain in right lower leg: Secondary | ICD-10-CM | POA: Diagnosis not present

## 2016-08-08 DIAGNOSIS — M79605 Pain in left leg: Secondary | ICD-10-CM | POA: Diagnosis not present

## 2016-08-08 DIAGNOSIS — M545 Low back pain: Secondary | ICD-10-CM | POA: Diagnosis not present

## 2016-08-08 DIAGNOSIS — F319 Bipolar disorder, unspecified: Secondary | ICD-10-CM | POA: Diagnosis not present

## 2016-08-08 DIAGNOSIS — G8929 Other chronic pain: Secondary | ICD-10-CM | POA: Diagnosis not present

## 2016-08-08 DIAGNOSIS — R269 Unspecified abnormalities of gait and mobility: Secondary | ICD-10-CM | POA: Diagnosis not present

## 2016-08-08 DIAGNOSIS — M79604 Pain in right leg: Secondary | ICD-10-CM | POA: Diagnosis not present

## 2016-08-08 DIAGNOSIS — I10 Essential (primary) hypertension: Secondary | ICD-10-CM | POA: Diagnosis not present

## 2016-08-08 DIAGNOSIS — Z96659 Presence of unspecified artificial knee joint: Secondary | ICD-10-CM | POA: Diagnosis not present

## 2016-08-10 DIAGNOSIS — F315 Bipolar disorder, current episode depressed, severe, with psychotic features: Secondary | ICD-10-CM | POA: Diagnosis not present

## 2016-08-25 DIAGNOSIS — M7989 Other specified soft tissue disorders: Secondary | ICD-10-CM | POA: Diagnosis not present

## 2016-08-25 DIAGNOSIS — M1712 Unilateral primary osteoarthritis, left knee: Secondary | ICD-10-CM | POA: Diagnosis not present

## 2016-08-25 DIAGNOSIS — M25562 Pain in left knee: Secondary | ICD-10-CM | POA: Diagnosis not present

## 2016-08-27 DIAGNOSIS — M1990 Unspecified osteoarthritis, unspecified site: Secondary | ICD-10-CM | POA: Diagnosis not present

## 2016-08-27 DIAGNOSIS — F419 Anxiety disorder, unspecified: Secondary | ICD-10-CM | POA: Diagnosis not present

## 2016-08-27 DIAGNOSIS — G894 Chronic pain syndrome: Secondary | ICD-10-CM | POA: Diagnosis not present

## 2016-08-27 DIAGNOSIS — I10 Essential (primary) hypertension: Secondary | ICD-10-CM | POA: Diagnosis not present

## 2016-08-30 ENCOUNTER — Emergency Department (HOSPITAL_COMMUNITY)
Admission: EM | Admit: 2016-08-30 | Discharge: 2016-08-30 | Disposition: A | Discharge status: Home / Self Care | Payer: Medicare Other | Attending: Emergency Medicine | Admitting: Emergency Medicine

## 2016-08-30 ENCOUNTER — Emergency Department (HOSPITAL_COMMUNITY): Payer: Medicare Other

## 2016-08-30 ENCOUNTER — Encounter (HOSPITAL_COMMUNITY): Payer: Self-pay | Admitting: *Deleted

## 2016-08-30 ENCOUNTER — Emergency Department (HOSPITAL_BASED_OUTPATIENT_CLINIC_OR_DEPARTMENT_OTHER): Admit: 2016-08-30 | Discharge: 2016-08-30 | Disposition: A | Discharge status: Home / Self Care | Payer: Medicare Other

## 2016-08-30 DIAGNOSIS — R0602 Shortness of breath: Secondary | ICD-10-CM | POA: Diagnosis not present

## 2016-08-30 DIAGNOSIS — R0600 Dyspnea, unspecified: Secondary | ICD-10-CM | POA: Insufficient documentation

## 2016-08-30 DIAGNOSIS — M25562 Pain in left knee: Secondary | ICD-10-CM

## 2016-08-30 DIAGNOSIS — M79609 Pain in unspecified limb: Secondary | ICD-10-CM

## 2016-08-30 DIAGNOSIS — I509 Heart failure, unspecified: Secondary | ICD-10-CM | POA: Diagnosis not present

## 2016-08-30 DIAGNOSIS — Z79899 Other long term (current) drug therapy: Secondary | ICD-10-CM | POA: Insufficient documentation

## 2016-08-30 DIAGNOSIS — R600 Localized edema: Secondary | ICD-10-CM | POA: Diagnosis not present

## 2016-08-30 DIAGNOSIS — I110 Hypertensive heart disease with heart failure: Secondary | ICD-10-CM | POA: Insufficient documentation

## 2016-08-30 LAB — BASIC METABOLIC PANEL
Anion gap: 8 (ref 5–15)
BUN: 11 mg/dL (ref 6–20)
CHLORIDE: 104 mmol/L (ref 101–111)
CO2: 26 mmol/L (ref 22–32)
CREATININE: 0.9 mg/dL (ref 0.44–1.00)
Calcium: 9.1 mg/dL (ref 8.9–10.3)
Glucose, Bld: 104 mg/dL — ABNORMAL HIGH (ref 65–99)
Potassium: 4.2 mmol/L (ref 3.5–5.1)
SODIUM: 138 mmol/L (ref 135–145)

## 2016-08-30 LAB — CBC
HCT: 40.2 % (ref 36.0–46.0)
Hemoglobin: 13 g/dL (ref 12.0–15.0)
MCH: 27.5 pg (ref 26.0–34.0)
MCHC: 32.3 g/dL (ref 30.0–36.0)
MCV: 85.2 fL (ref 78.0–100.0)
PLATELETS: 252 10*3/uL (ref 150–400)
RBC: 4.72 MIL/uL (ref 3.87–5.11)
RDW: 13.6 % (ref 11.5–15.5)
WBC: 7 10*3/uL (ref 4.0–10.5)

## 2016-08-30 LAB — TROPONIN I

## 2016-08-30 LAB — D-DIMER, QUANTITATIVE (NOT AT ARMC): D DIMER QUANT: 0.33 ug{FEU}/mL (ref 0.00–0.50)

## 2016-08-30 MED ORDER — OXYCODONE-ACETAMINOPHEN 5-325 MG PO TABS
2.0000 | ORAL_TABLET | Freq: Once | ORAL | Status: AC
  Administered 2016-08-30: 2 via ORAL
  Filled 2016-08-30: qty 2

## 2016-08-30 NOTE — ED Triage Notes (Signed)
PT states L knee pain and swelling accompanied by sob with exertion x 1.5 weeks.  Denies chest pain.  States was seen at [place] who told her she had arthritis in her L knee.

## 2016-08-30 NOTE — ED Notes (Signed)
Delay explained to pt.  Pt is ambuatory, told her she will go to room shortly

## 2016-08-30 NOTE — Progress Notes (Signed)
Preliminary results by tech - Left Lower Ext. Venous Duplex Completed. Negative for deep and superficial vein thrombosis and Baker's cyst. Alok Minshall, BS, RDMS, RVT  

## 2016-08-30 NOTE — Discharge Instructions (Signed)
Follow with your doctor

## 2016-08-30 NOTE — ED Provider Notes (Signed)
[facility]-EMERGENCY DEPT Provider Note   CSN: [id number] Arrival date & time: 08/30/16  1047     History   Chief Complaint Chief Complaint  Patient presents with  . Leg Pain  . Shortness of Breath    HPI Angelina Sheriffngela M Conkel is a [age] y.o. female.  HPI  Patient has had pain in her left knee for the last couple weeks. No trauma. [facility] and had an x-ray and states she was diagnosed with arthritis. She states she's also been more short of breath. States that she has had more shortness of breath with exertion. Slight swelling in her legs but states he has a history of CHF. No change in her medications. Previous heart attack arrest after overdose. States she feels a swelling behind her left knee. No fevers or chills. No chest pain. Past Medical History:  Diagnosis Date  . Arthritis   . Bipolar disorder (HCC)   . CHF (congestive heart failure) (HCC)   . Degenerative disc disease   . Hypertension     Patient Active Problem List   Diagnosis Date Noted  . CEREBRAL EDEMA 01/19/2010  . CARDIOMYOPATHY 01/19/2010  . PULMONARY EDEMA 01/19/2010  . RESPIRATORY FAILURE 01/19/2010  . RENAL FAILURE 01/19/2010    Past Surgical History:  Procedure Laterality Date  . ABDOMINAL HYSTERECTOMY    . APPENDECTOMY    . CHOLECYSTECTOMY      OB History    No data available       Home Medications    Prior to Admission medications   Medication Sig Start Date End Date Taking? Authorizing Provider  ARIPiprazole (ABILIFY) 10 MG tablet Take 10 mg by mouth at bedtime.   Yes Historical Provider, MD  atenolol-chlorthalidone (TENORETIC) 50-25 MG tablet Take 1 tablet by mouth daily. 11/01/15  Yes Historical Provider, MD  DULoxetine (CYMBALTA) 30 MG capsule Take 90 mg by mouth daily.   Yes Historical Provider, MD  HYDROcodone-acetaminophen (NORCO/VICODIN) 5-325 MG per tablet Take 1 tablet by mouth 3 (three) times daily.  01/06/14  Yes Historical Provider, MD  meloxicam (MOBIC) 15 MG tablet Take  15 mg by mouth daily.   Yes Historical Provider, MD  oxcarbazepine (TRILEPTAL) 600 MG tablet Take 600 mg by mouth 2 (two) times daily.   Yes Historical Provider, MD  oxyCODONE-acetaminophen (PERCOCET) 10-325 MG tablet Take 1 tablet by mouth every 4 (four) hours as needed for pain.   Yes Historical Provider, MD  traZODone (DESYREL) 150 MG tablet Take 150 mg by mouth at bedtime.   Yes Historical Provider, MD  vortioxetine HBr (TRINTELLIX) 10 MG TABS Take 10 mg by mouth daily.   Yes Historical Provider, MD  amLODipine (NORVASC) 10 MG tablet Take 10 mg by mouth daily. 11/25/15   Historical Provider, MD  furosemide (LASIX) 20 MG tablet Take 20 mg by mouth daily.    Historical Provider, MD  hydrochlorothiazide (HYDRODIURIL) 25 MG tablet Take 25 mg by mouth daily.    Historical Provider, MD  losartan (COZAAR) 100 MG tablet Take 100 mg by mouth daily. 11/25/15   Historical Provider, MD  predniSONE (DELTASONE) 20 MG tablet 3 Tabs PO Days 1-3, then 2 tabs PO Days 4-6, then 1 tab PO Day 7-9, then Half Tab PO Day 10-12 01/28/16   Earley FavorGail Schulz, NP    Family History No family history on file.  Social History Social History  Substance Use Topics  . Smoking status: Never Smoker  . Smokeless tobacco: Never Used  . Alcohol  use No     Allergies   Patient has no known allergies.   Review of Systems Review of Systems  Constitutional: Negative for appetite change.  HENT: Negative for congestion.   Eyes: Negative for redness.  Respiratory: Positive for shortness of breath.   Cardiovascular: Positive for leg swelling. Negative for chest pain.  Gastrointestinal: Negative for abdominal distention.  Genitourinary: Negative for difficulty urinating and dysuria.  Musculoskeletal:       Left knee pain  Skin: Negative for wound.  Neurological: Negative for weakness and light-headedness.  Hematological: Negative for adenopathy.     Physical Exam Updated Vital Signs BP 102/57   Pulse 73   Temp 98.3 F (36.8  C) (Oral)   Resp 20   Ht 5\' 5"  (1.651 m)   Wt 267 lb (121.1 kg)   SpO2 100%   BMI 44.43 kg/m   Physical Exam  Constitutional: She appears well-developed.  Patient is obese  HENT:  Head: Atraumatic.  Eyes: Pupils are equal, round, and reactive to light.  Neck: No JVD present.  Cardiovascular: Normal rate.   Pulmonary/Chest: Effort normal. No respiratory distress. She has no wheezes. She has no rales.  Abdominal: Soft. There is no tenderness.  Musculoskeletal: She exhibits edema.  Trace edema to bilateral lower extremities, possibly worse on left. Some tenderness to knee with movement. Slight tenderness posteriorly. Range of motion intact with some pain. No effusion of knee. Neurovascular intact in left foot.  Neurological: She is alert.  Skin: Skin is warm.  Psychiatric: She has a normal mood and affect.     ED Treatments / Results  Labs (all labs ordered are listed, but only abnormal results are displayed) Labs Reviewed  BASIC METABOLIC PANEL - Abnormal; Notable for the following:       Result Value   Glucose, Bld 104 (*)    All other components within normal limits  CBC  D-DIMER, QUANTITATIVE (NOT AT [facility])  TROPONIN I    EKG  EKG Interpretation  Date/Time:  Thursday August 30 2016 11:32:09 EST Ventricular Rate:  68 PR Interval:  150 QRS Duration: 86 QT Interval:  412 QTC Calculation: 438 R Axis:   17 Text Interpretation:  Sinus rhythm with Premature supraventricular complexes Otherwise normal ECG Confirmed by Rubin Payor  MD, Harrold Donath [phone]) on 08/30/2016 1:22:44 PM       Radiology Dg Chest 2 View  Result Date: 08/30/2016 CLINICAL DATA:  Shortness of breath, worsening over the past 2 weeks. Mid chest tightness. EXAM: CHEST  2 VIEW COMPARISON:  Single-view of the chest 07/15/2016 and 02/19/2016. FINDINGS: The lungs are clear. Heart size is normal. No pneumothorax or pleural effusion. No focal bony abnormality. The patient is status post cholecystectomy.  IMPRESSION: No acute disease. Electronically Signed   By: Drusilla Kanner M.D.   On: 08/30/2016 12:01    Procedures Procedures (including critical care time)  Medications Ordered in ED Medications  oxyCODONE-acetaminophen (PERCOCET/ROXICET) 5-325 MG per tablet 2 tablet (2 tablets Oral Given 08/30/16 1412)     Initial Impression / Assessment and Plan / ED Course  I have reviewed the triage vital signs and the nursing notes.  Pertinent labs & imaging results that were available during my care of the patient were reviewed by me and considered in my medical decision making (see chart for details).  Clinical Course      Patient with dyspnea and left knee pain. Negative d-dimer. Chest x-ray does not show volume overload. Ultrasound done and shows no  DVT. Had x-ray done yesterday. Labs reassuring. Negative troponin. Will discharge home.  Final Clinical Impressions(s) / ED Diagnoses   Final diagnoses:  Acute pain of left knee  Dyspnea, unspecified type    New Prescriptions New Prescriptions   No medications on file     Benjiman Core, MD 08/30/16 1457

## 2016-09-10 DIAGNOSIS — M25562 Pain in left knee: Secondary | ICD-10-CM | POA: Diagnosis not present

## 2016-09-10 DIAGNOSIS — M170 Bilateral primary osteoarthritis of knee: Secondary | ICD-10-CM | POA: Diagnosis not present

## 2016-09-10 DIAGNOSIS — M25561 Pain in right knee: Secondary | ICD-10-CM | POA: Diagnosis not present

## 2016-09-17 DIAGNOSIS — M1711 Unilateral primary osteoarthritis, right knee: Secondary | ICD-10-CM | POA: Diagnosis not present

## 2016-09-17 DIAGNOSIS — F3132 Bipolar disorder, current episode depressed, moderate: Secondary | ICD-10-CM | POA: Diagnosis not present

## 2016-09-17 DIAGNOSIS — F900 Attention-deficit hyperactivity disorder, predominantly inattentive type: Secondary | ICD-10-CM | POA: Diagnosis not present

## 2016-09-17 DIAGNOSIS — F4312 Post-traumatic stress disorder, chronic: Secondary | ICD-10-CM | POA: Diagnosis not present

## 2016-09-17 DIAGNOSIS — M25561 Pain in right knee: Secondary | ICD-10-CM | POA: Diagnosis not present

## 2016-09-20 DIAGNOSIS — M25562 Pain in left knee: Secondary | ICD-10-CM | POA: Diagnosis not present

## 2016-09-20 DIAGNOSIS — M1712 Unilateral primary osteoarthritis, left knee: Secondary | ICD-10-CM | POA: Diagnosis not present

## 2016-09-22 DIAGNOSIS — N3000 Acute cystitis without hematuria: Secondary | ICD-10-CM | POA: Diagnosis not present

## 2016-09-24 DIAGNOSIS — R109 Unspecified abdominal pain: Secondary | ICD-10-CM | POA: Diagnosis not present

## 2016-09-24 DIAGNOSIS — R1032 Left lower quadrant pain: Secondary | ICD-10-CM | POA: Diagnosis not present

## 2016-09-24 DIAGNOSIS — Z79899 Other long term (current) drug therapy: Secondary | ICD-10-CM | POA: Diagnosis not present

## 2016-09-24 DIAGNOSIS — R110 Nausea: Secondary | ICD-10-CM | POA: Diagnosis not present

## 2016-09-24 DIAGNOSIS — N10 Acute tubulo-interstitial nephritis: Secondary | ICD-10-CM | POA: Diagnosis not present

## 2016-09-24 DIAGNOSIS — R630 Anorexia: Secondary | ICD-10-CM | POA: Diagnosis not present

## 2016-09-24 DIAGNOSIS — F319 Bipolar disorder, unspecified: Secondary | ICD-10-CM | POA: Diagnosis not present

## 2016-09-24 DIAGNOSIS — I10 Essential (primary) hypertension: Secondary | ICD-10-CM | POA: Diagnosis not present

## 2016-10-01 DIAGNOSIS — G894 Chronic pain syndrome: Secondary | ICD-10-CM | POA: Diagnosis not present

## 2016-10-01 DIAGNOSIS — Z8719 Personal history of other diseases of the digestive system: Secondary | ICD-10-CM | POA: Diagnosis not present

## 2016-10-01 DIAGNOSIS — F419 Anxiety disorder, unspecified: Secondary | ICD-10-CM | POA: Diagnosis not present

## 2016-10-01 DIAGNOSIS — I10 Essential (primary) hypertension: Secondary | ICD-10-CM | POA: Diagnosis not present

## 2016-10-03 DIAGNOSIS — M1711 Unilateral primary osteoarthritis, right knee: Secondary | ICD-10-CM | POA: Diagnosis not present

## 2016-10-03 DIAGNOSIS — M25561 Pain in right knee: Secondary | ICD-10-CM | POA: Diagnosis not present

## 2016-10-09 DIAGNOSIS — M1712 Unilateral primary osteoarthritis, left knee: Secondary | ICD-10-CM | POA: Diagnosis not present

## 2016-10-09 DIAGNOSIS — M25562 Pain in left knee: Secondary | ICD-10-CM | POA: Diagnosis not present

## 2016-10-31 DIAGNOSIS — G894 Chronic pain syndrome: Secondary | ICD-10-CM | POA: Diagnosis not present

## 2016-10-31 DIAGNOSIS — F339 Major depressive disorder, recurrent, unspecified: Secondary | ICD-10-CM | POA: Diagnosis not present

## 2016-10-31 DIAGNOSIS — E669 Obesity, unspecified: Secondary | ICD-10-CM | POA: Diagnosis not present

## 2016-10-31 DIAGNOSIS — I973 Postprocedural hypertension: Secondary | ICD-10-CM | POA: Diagnosis not present

## 2016-10-31 DIAGNOSIS — F419 Anxiety disorder, unspecified: Secondary | ICD-10-CM | POA: Diagnosis not present

## 2016-11-21 IMAGING — CR DG KNEE COMPLETE 4+V*R*
4 series · 4 of 4 positions shown · non-contrast
Comparison: 08/02/2015, 11/06/2014.

CLINICAL DATA: 49-year-old presenting with 1 month history of
progressively worsening right knee pain associated with crepitus.

EXAM:
RIGHT KNEE - COMPLETE 4+ VIEW

[t knee ap right]
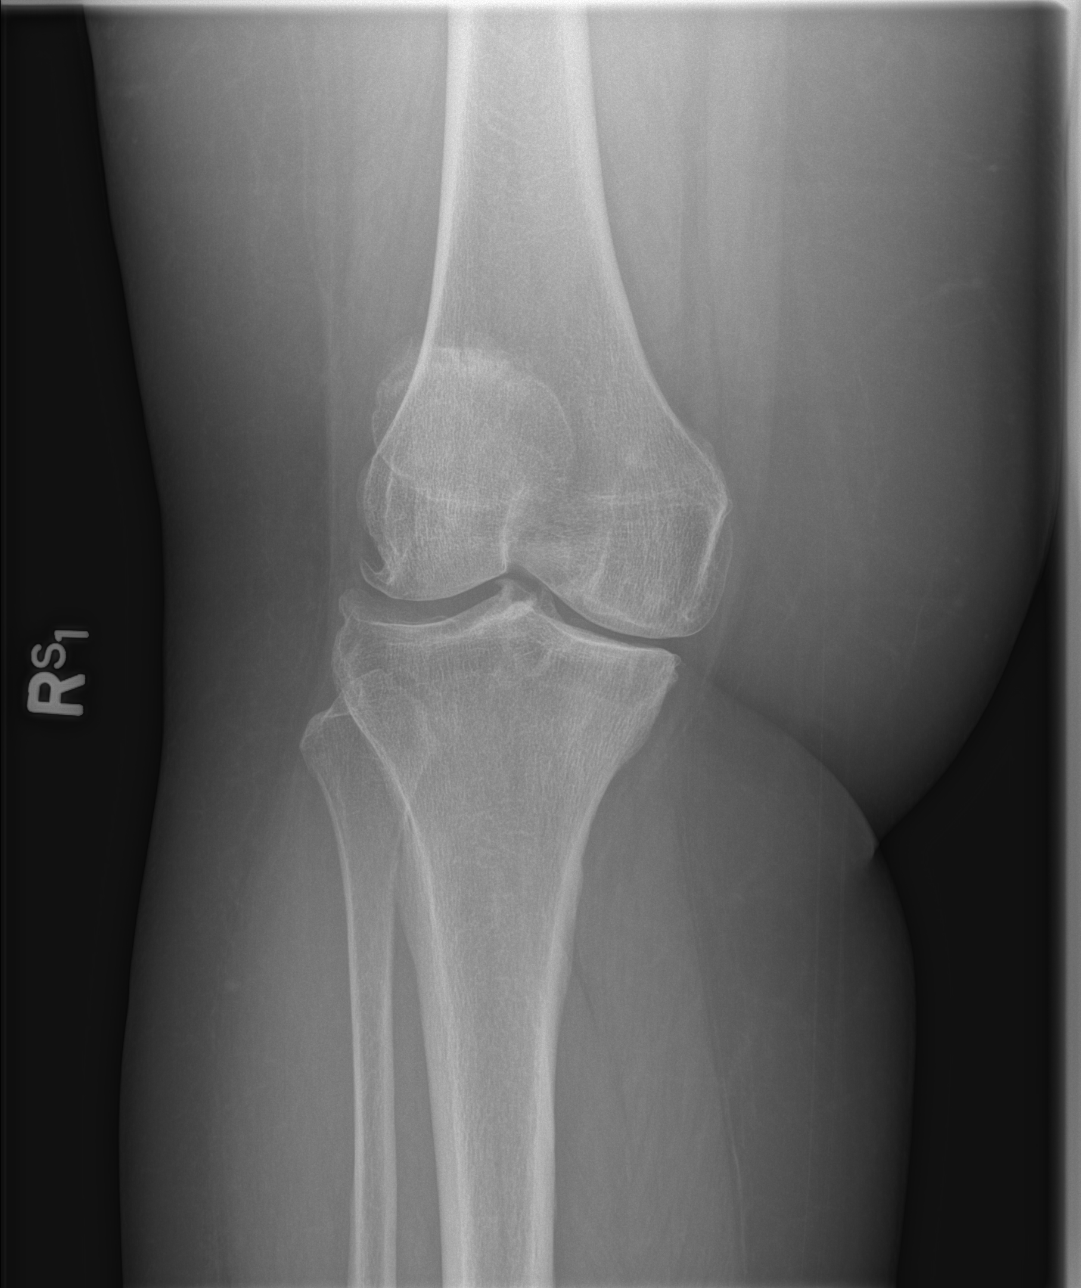

[t knee obl right (1 of 2)]
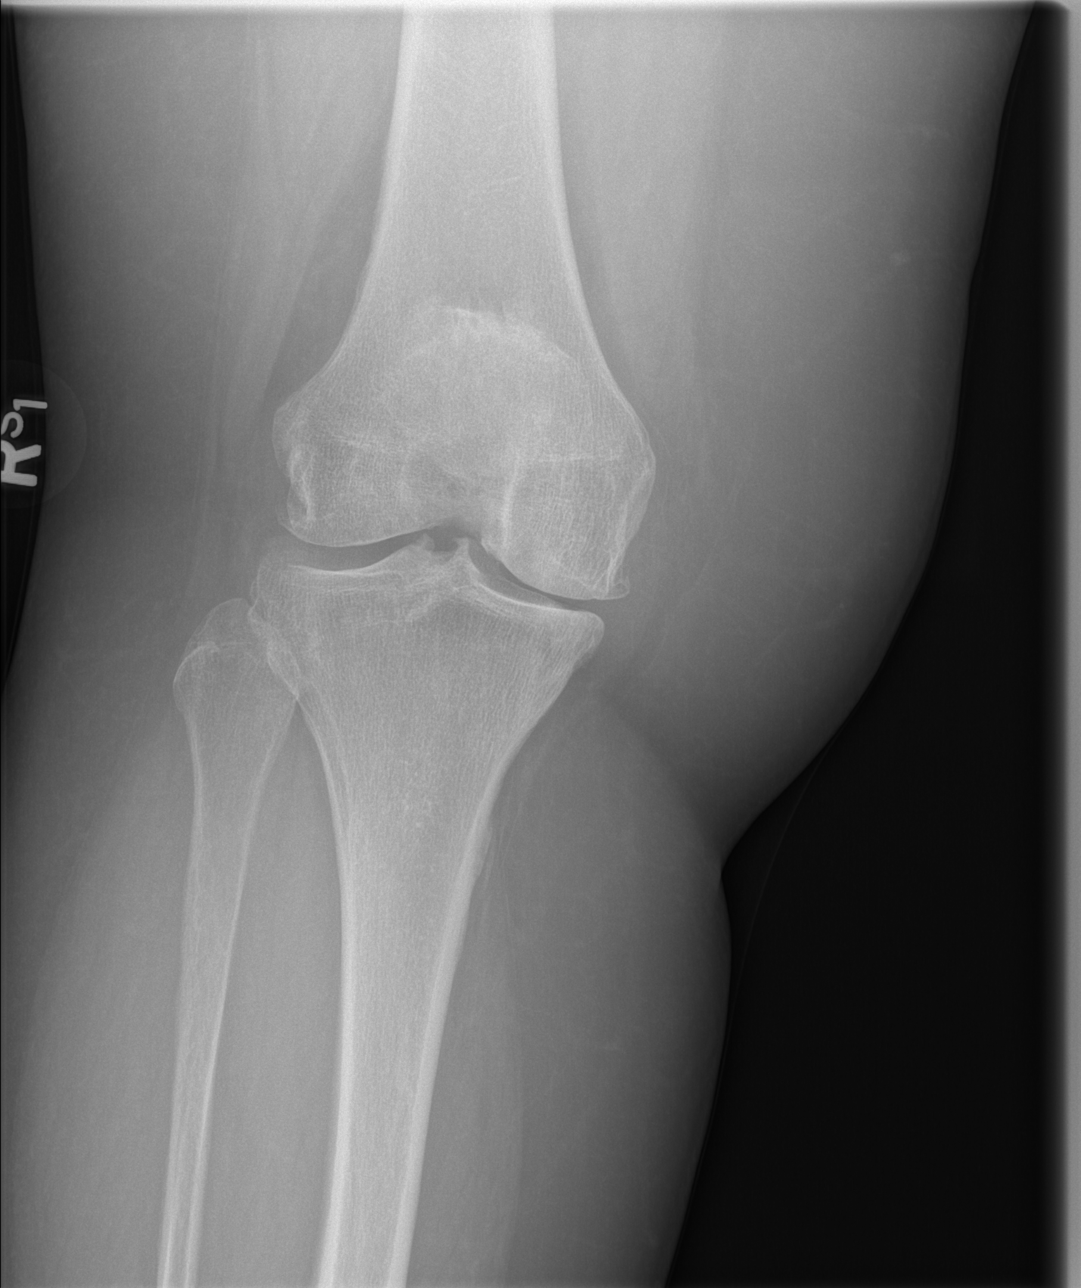

[t knee obl right (2 of 2)]
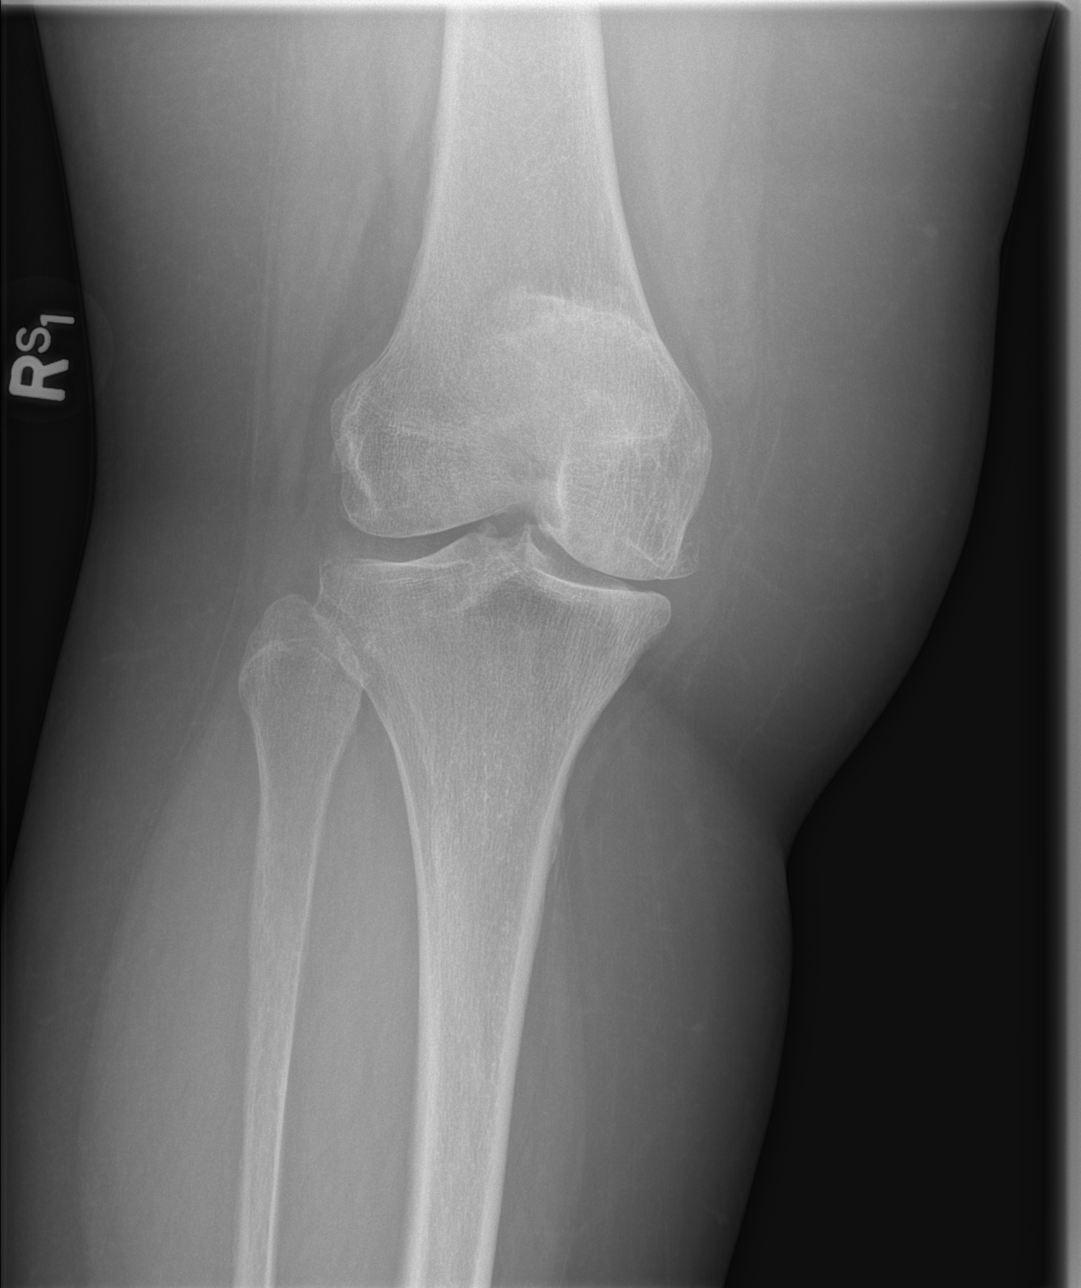

[t knee lat right]
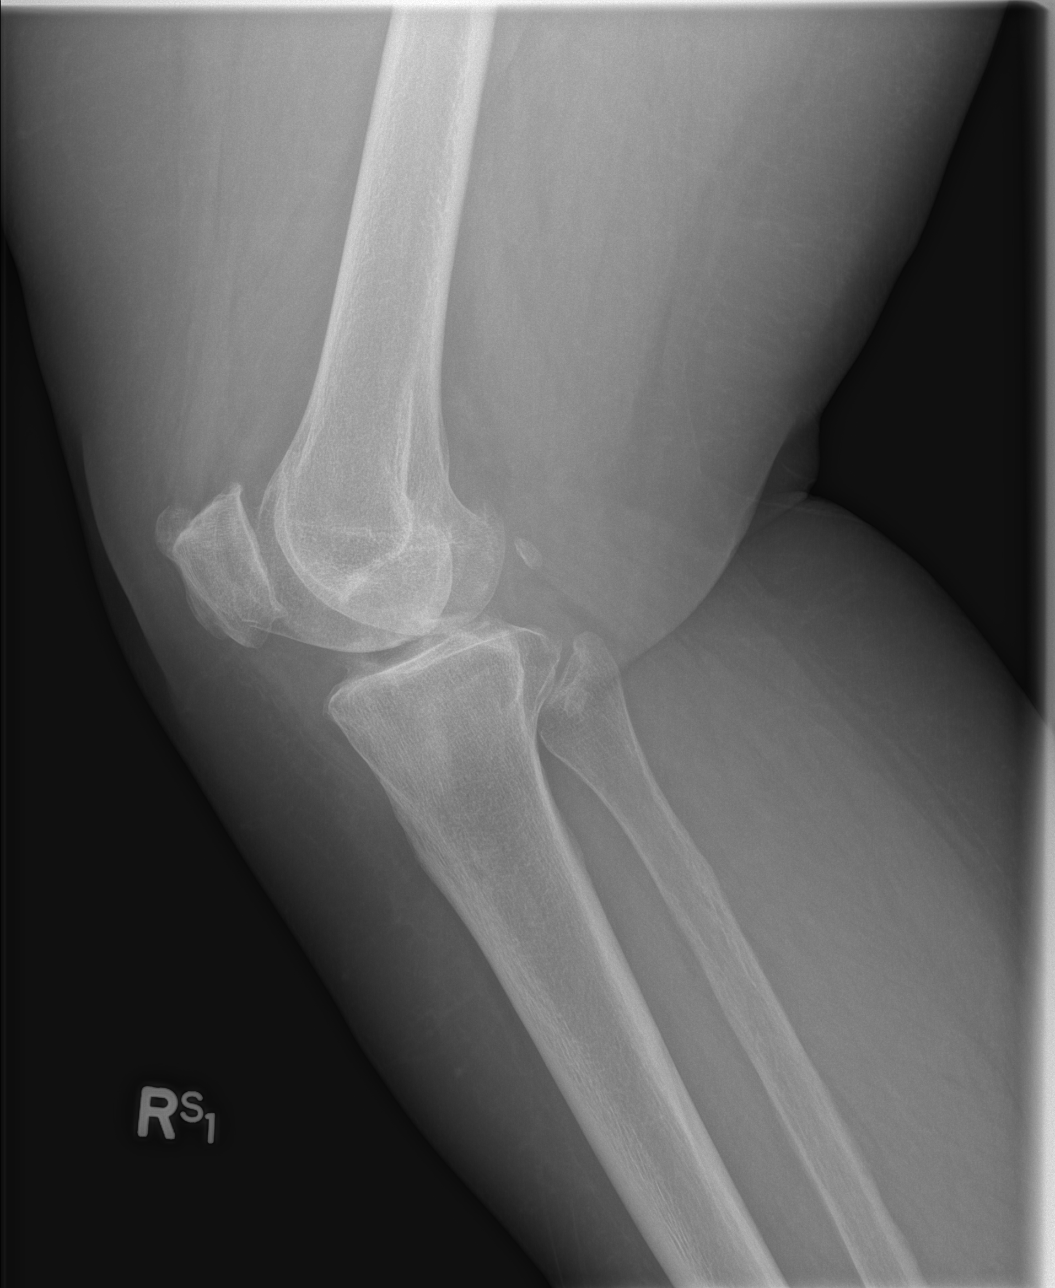

[4 of 4 positions shown; findings below may reference images not displayed]

FINDINGS: No evidence of acute or subacute fracture or dislocation. Severe
medial compartment joint space narrowing and associated hypertrophic
spurring and mild lateral compartment joint space narrowing with
associated hypertrophic spurring, unchanged since the examination 6
months ago. Patellofemoral compartment joint space well preserved.
Enthesopathic spurring at the insertion of the quadriceps tendon on
the superior patella, unchanged. Small joint effusion.
Well-preserved bone mineral density.
IMPRESSION: Severe medial compartment osteoarthritis and mild lateral
compartment osteoarthritis, stable since the examination 6 months
ago. No acute or subacute osseous abnormality. Small joint effusion.

## 2016-12-04 DIAGNOSIS — M5136 Other intervertebral disc degeneration, lumbar region: Secondary | ICD-10-CM | POA: Diagnosis not present

## 2016-12-04 DIAGNOSIS — F419 Anxiety disorder, unspecified: Secondary | ICD-10-CM | POA: Diagnosis not present

## 2016-12-04 DIAGNOSIS — M1991 Primary osteoarthritis, unspecified site: Secondary | ICD-10-CM | POA: Diagnosis not present

## 2016-12-04 DIAGNOSIS — G894 Chronic pain syndrome: Secondary | ICD-10-CM | POA: Diagnosis not present

## 2016-12-04 DIAGNOSIS — F339 Major depressive disorder, recurrent, unspecified: Secondary | ICD-10-CM | POA: Diagnosis not present

## 2016-12-04 DIAGNOSIS — E669 Obesity, unspecified: Secondary | ICD-10-CM | POA: Diagnosis not present

## 2016-12-19 DIAGNOSIS — F319 Bipolar disorder, unspecified: Secondary | ICD-10-CM | POA: Diagnosis not present

## 2017-01-01 DIAGNOSIS — G894 Chronic pain syndrome: Secondary | ICD-10-CM | POA: Diagnosis not present

## 2017-01-01 DIAGNOSIS — M5136 Other intervertebral disc degeneration, lumbar region: Secondary | ICD-10-CM | POA: Diagnosis not present

## 2017-01-01 DIAGNOSIS — I10 Essential (primary) hypertension: Secondary | ICD-10-CM | POA: Diagnosis not present

## 2017-01-01 DIAGNOSIS — F419 Anxiety disorder, unspecified: Secondary | ICD-10-CM | POA: Diagnosis not present

## 2017-01-01 DIAGNOSIS — F319 Bipolar disorder, unspecified: Secondary | ICD-10-CM | POA: Diagnosis not present

## 2017-02-27 DIAGNOSIS — F314 Bipolar disorder, current episode depressed, severe, without psychotic features: Secondary | ICD-10-CM | POA: Diagnosis not present

## 2017-03-14 DIAGNOSIS — M170 Bilateral primary osteoarthritis of knee: Secondary | ICD-10-CM | POA: Diagnosis not present

## 2017-03-14 DIAGNOSIS — G8929 Other chronic pain: Secondary | ICD-10-CM | POA: Diagnosis not present

## 2017-03-14 DIAGNOSIS — M7651 Patellar tendinitis, right knee: Secondary | ICD-10-CM | POA: Diagnosis not present

## 2017-03-14 DIAGNOSIS — M7652 Patellar tendinitis, left knee: Secondary | ICD-10-CM | POA: Diagnosis not present

## 2017-03-14 DIAGNOSIS — M222X1 Patellofemoral disorders, right knee: Secondary | ICD-10-CM | POA: Diagnosis not present

## 2017-04-08 DIAGNOSIS — Z6841 Body Mass Index (BMI) 40.0 and over, adult: Secondary | ICD-10-CM | POA: Diagnosis not present

## 2017-04-08 DIAGNOSIS — Z79899 Other long term (current) drug therapy: Secondary | ICD-10-CM | POA: Diagnosis not present

## 2017-04-08 DIAGNOSIS — M25569 Pain in unspecified knee: Secondary | ICD-10-CM | POA: Diagnosis not present

## 2017-04-08 DIAGNOSIS — Z1389 Encounter for screening for other disorder: Secondary | ICD-10-CM | POA: Diagnosis not present

## 2017-04-08 DIAGNOSIS — I10 Essential (primary) hypertension: Secondary | ICD-10-CM | POA: Diagnosis not present

## 2017-04-11 ENCOUNTER — Other Ambulatory Visit: Payer: Self-pay | Admitting: Orthopedic Surgery

## 2017-04-30 NOTE — Pre-Procedure Instructions (Signed)
Cynthia Richardson  04/30/2017      [facility] - [place], [place] - [address] [place] [address] Phone: [phone] Fax: [phone]    Your procedure is scheduled on May 13, 2017  Report to [facility] Admitting Entrance "A" at 7:00 A.M.    Call this number if you have problems the morning of surgery:  [phone]   Remember:  Do not eat food or drink liquids after midnight on May 12, 2017  Take these medicines the morning of surgery with A SIP OF WATER: DULoxetine (CYMBALTA) and Oxcarbazepine (TRILEPTAL). If needed Acetaminophen (TYLENOL).  Stop taking all Aspirins, Vitamins, Fish oils, and Herbal medicatons. Also stop all NSAIDS i.e. Advil, Motrin, Aleve, Anaprox, Naproxen, BC and Goody Powders.   Do not wear jewelry, make-up or nail polish.  Do not wear lotions, powders, or perfumes, or deoderant.  Do not shave 48 hours prior to surgery.   Do not bring valuables to the hospital.  [facility] is not responsible for any belongings or valuables.  Contacts, dentures or bridgework may not be worn into surgery.  Leave your suitcase in the car.  After surgery it may be brought to your room.  For patients admitted to the hospital, discharge time will be determined by your treatment team.   Special instructions:    [place]- Preparing For Surgery  Before surgery, you can play an important role. Because skin is not sterile, your skin needs to be as free of germs as possible. You can reduce the number of germs on your skin by washing with CHG (chlorahexidine gluconate) Soap before surgery.  CHG is an antiseptic cleaner which kills germs and bonds with the skin to continue killing germs even after washing.  Please do not use if you have an allergy to CHG or antibacterial soaps. If your skin becomes reddened/irritated stop using the CHG.  Do not shave (including legs and underarms) for at least 48 hours prior to first CHG shower. It is  OK to shave your face. Please follow these instructions carefully.   1. Shower the NIGHT BEFORE SURGERY and the MORNING OF SURGERY with CHG.   2. If you chose to wash your hair, wash your hair first as usual with your normal shampoo.  3. After you shampoo, rinse your hair and body thoroughly to remove the shampoo.  4. Use CHG as you would any other liquid soap. You can apply CHG directly to the skin and wash gently with a scrungie or a clean washcloth.   5. Apply the CHG Soap to your body ONLY FROM THE NECK DOWN.  Do not use on open wounds or open sores. Avoid contact with your eyes, ears, mouth and genitals (private parts). Wash genitals (private parts) with your normal soap.  6. Wash thoroughly, paying special attention to the area where your surgery will be performed.  7. Thoroughly rinse your body with warm water from the neck down.  8. DO NOT shower/wash with your normal soap after using and rinsing off the CHG Soap.  9. Pat yourself dry with a CLEAN TOWEL.   10. Wear CLEAN PAJAMAS   11. Place CLEAN SHEETS on your bed the night of your first shower and DO NOT SLEEP WITH PETS.  Day of Surgery: Do not apply any deodorants/lotions. Please wear clean clothes to the hospital/surgery center.    Please read over the following fact sheets that you were given.  Pain Booklet, Coughing and Deep Breathing, MRSA Information and Surgical Site Infection Prevention

## 2017-05-01 ENCOUNTER — Encounter (HOSPITAL_COMMUNITY): Payer: Self-pay

## 2017-05-01 ENCOUNTER — Encounter (HOSPITAL_COMMUNITY)
Admission: RE | Admit: 2017-05-01 | Discharge: 2017-05-01 | Disposition: A | Discharge status: Home / Self Care | Payer: Medicare Other | Source: Ambulatory Visit | Attending: Orthopedic Surgery | Admitting: Orthopedic Surgery

## 2017-05-01 DIAGNOSIS — Z01818 Encounter for other preprocedural examination: Secondary | ICD-10-CM | POA: Insufficient documentation

## 2017-05-01 DIAGNOSIS — Z90710 Acquired absence of both cervix and uterus: Secondary | ICD-10-CM | POA: Diagnosis not present

## 2017-05-01 DIAGNOSIS — Z9049 Acquired absence of other specified parts of digestive tract: Secondary | ICD-10-CM | POA: Insufficient documentation

## 2017-05-01 DIAGNOSIS — I110 Hypertensive heart disease with heart failure: Secondary | ICD-10-CM | POA: Insufficient documentation

## 2017-05-01 DIAGNOSIS — I429 Cardiomyopathy, unspecified: Secondary | ICD-10-CM | POA: Insufficient documentation

## 2017-05-01 DIAGNOSIS — I674 Hypertensive encephalopathy: Secondary | ICD-10-CM | POA: Insufficient documentation

## 2017-05-01 DIAGNOSIS — R569 Unspecified convulsions: Secondary | ICD-10-CM | POA: Diagnosis not present

## 2017-05-01 DIAGNOSIS — I509 Heart failure, unspecified: Secondary | ICD-10-CM | POA: Insufficient documentation

## 2017-05-01 HISTORY — DX: Dorsalgia, unspecified: M54.9

## 2017-05-01 HISTORY — DX: Hypertensive encephalopathy: I67.4

## 2017-05-01 HISTORY — DX: Unspecified convulsions: R56.9

## 2017-05-01 HISTORY — DX: Other intervertebral disc displacement, lumbar region: M51.26

## 2017-05-01 HISTORY — DX: Other chronic pain: G89.29

## 2017-05-01 HISTORY — DX: Depression, unspecified: F32.A

## 2017-05-01 HISTORY — DX: Bilateral primary osteoarthritis of knee: M17.0

## 2017-05-01 HISTORY — DX: Personal history of urinary calculi: Z87.442

## 2017-05-01 HISTORY — DX: Major depressive disorder, single episode, unspecified: F32.9

## 2017-05-01 HISTORY — DX: Pneumonia, unspecified organism: J18.9

## 2017-05-01 HISTORY — DX: Respiratory failure, unspecified, unspecified whether with hypoxia or hypercapnia: J96.90

## 2017-05-01 HISTORY — DX: Other intervertebral disc degeneration, lumbar region without mention of lumbar back pain or lower extremity pain: M51.369

## 2017-05-01 HISTORY — DX: Other cardiomyopathies: I42.8

## 2017-05-01 HISTORY — DX: Anxiety disorder, unspecified: F41.9

## 2017-05-01 HISTORY — DX: Other intervertebral disc degeneration, lumbar region: M51.36

## 2017-05-01 LAB — CBC WITH DIFFERENTIAL/PLATELET
BASOS ABS: 0.1 10*3/uL (ref 0.0–0.1)
Basophils Relative: 1 %
EOS ABS: 0.2 10*3/uL (ref 0.0–0.7)
EOS PCT: 2 %
HCT: 38.3 % (ref 36.0–46.0)
Hemoglobin: 12.3 g/dL (ref 12.0–15.0)
Lymphocytes Relative: 45 %
Lymphs Abs: 3.9 10*3/uL (ref 0.7–4.0)
MCH: 28.9 pg (ref 26.0–34.0)
MCHC: 32.1 g/dL (ref 30.0–36.0)
MCV: 90.1 fL (ref 78.0–100.0)
MONO ABS: 0.4 10*3/uL (ref 0.1–1.0)
Monocytes Relative: 4 %
Neutro Abs: 4.1 10*3/uL (ref 1.7–7.7)
Neutrophils Relative %: 48 %
PLATELETS: 288 10*3/uL (ref 150–400)
RBC: 4.25 MIL/uL (ref 3.87–5.11)
RDW: 14.5 % (ref 11.5–15.5)
WBC: 8.6 10*3/uL (ref 4.0–10.5)

## 2017-05-01 LAB — COMPREHENSIVE METABOLIC PANEL
ALT: 14 U/L (ref 14–54)
AST: 14 U/L — AB (ref 15–41)
Albumin: 3.2 g/dL — ABNORMAL LOW (ref 3.5–5.0)
Alkaline Phosphatase: 111 U/L (ref 38–126)
Anion gap: 4 — ABNORMAL LOW (ref 5–15)
BUN: 17 mg/dL (ref 6–20)
CHLORIDE: 108 mmol/L (ref 101–111)
CO2: 25 mmol/L (ref 22–32)
CREATININE: 0.92 mg/dL (ref 0.44–1.00)
Calcium: 8.8 mg/dL — ABNORMAL LOW (ref 8.9–10.3)
Glucose, Bld: 100 mg/dL — ABNORMAL HIGH (ref 65–99)
POTASSIUM: 4.3 mmol/L (ref 3.5–5.1)
SODIUM: 137 mmol/L (ref 135–145)
Total Bilirubin: 0.4 mg/dL (ref 0.3–1.2)
Total Protein: 7.4 g/dL (ref 6.5–8.1)

## 2017-05-01 LAB — SURGICAL PCR SCREEN
MRSA, PCR: NEGATIVE
STAPHYLOCOCCUS AUREUS: POSITIVE — AB

## 2017-05-01 NOTE — Progress Notes (Signed)
   05/01/17 1055  OBSTRUCTIVE SLEEP APNEA  Have you ever been diagnosed with sleep apnea through a sleep study? No  Do you snore loudly (loud enough to be heard through closed doors)?  1  Do you often feel tired, fatigued, or sleepy during the daytime (such as falling asleep during driving or talking to someone)? 0  Has anyone observed you stop breathing during your sleep? 1  Do you have, or are you being treated for high blood pressure? 1  BMI more than 35 kg/m2? 1  Age > 50 (1-yes) 1  Neck circumference greater than:Female 16 inches or larger, Female 17inches or larger? 1  Female Gender (Yes=1) 0  Obstructive Sleep Apnea Score 6  Score 5 or greater  Results sent to PCP

## 2017-05-01 NOTE — Progress Notes (Addendum)
[facility] -  Dr. Tomie China- 12/2009 Pt has not seen since then.  Chest x-ray - 08/30/16 EKG - 08/30/16 Stress Test - 2017 ECHO - 01/02/10 Cardiac Cath - 12/31/09  Sleep Study - Denies CPAP - None STOP BANG- Positive Chart will be sent for review by anesthesia due to cardiac history and statements of cardiac arrest in March 2011.  Pt denies having chest pain, sob, or fever at this time. All instructions explained to the pt, with a verbal understanding of the material. Pt agrees to go over the instructions while at home for a better understanding. The opportunity to ask questions was provided.

## 2017-05-01 NOTE — Progress Notes (Signed)
Prescription called in to [facility] [phone] for Muprocin 2% ointment. Pt notified and verbalized an understanding w/o any further questions or concerns.

## 2017-05-06 ENCOUNTER — Encounter (HOSPITAL_COMMUNITY): Payer: Self-pay

## 2017-05-06 NOTE — Progress Notes (Signed)
Anesthesia Chart Review: Patient is a 51 year old female scheduled for right TKA on 05/13/17 by Dr. Sherlean Foot.  History includes never smoker, hypertension, CHF, non-ischemic cardiomyopathy (12/2009 in the setting of unintentional drug OD with acute respiratory/renal failure), bipolar disorder, depression, anxiety, hypertensive encephalopathy, seizures, chronic back pain, cholecystectomy, appendectomy, hysterectomy. BMI is consistent with morbid obesity.  Hospitalized 12/2009 for unintentional drug overdose (acetaminophen, benzodiazepines, opiates) with fall (not sure how long she was down) and noted to be in acute renal failure (Cr 2.86) and acute respiratory failure with probable pulmonary edema requiring intubation. Troponin was initially negative, but developed tachycardia and ST changes on EKG with STAT 12/30/09 echo showing severely reduced LV function. Repeat echo (with limited imaging) on 01/02/17 showed "clear impression that LV function is significantly better than the recent study." Cardiac cath showed no angiographic CAD, so stress (Takotsubo type) cardiomyopathy suspected. Subsequent echo in 2015 showed normal LVEF and 2017 stress test was non-ischemic. She saw cardiologist Dr. Dietrich Pates during her 2011 admission. Previously she had seen Dr. Belva Crome on 03/29/08 (when he was with [facility]). His 2009 note indicates that she reported a previous history of NSTEMI in the setting of cocaine. Last cocaine use is documented as 2012.  PCP is Eunice Blase, PA-C with [facility] who medically cleared patient for surgery.  Meds include Abilify, atenolol-chlorthalidone, Cymbalta, Trileptal, trazodone.  BP 127/72   Pulse 60   Temp 36.6 C   Resp 20   Ht 5\' 5"  (1.651 m)   Wt 255 lb 6.4 oz (115.8 kg)   SpO2 96%   BMI 42.50 kg/m   EKG 08/30/16: SR with premature supraventricular complexes.  Nuclear stress test 12/03/15 [facility] [place]): Impression: 1. No definitive scintigraphic  evidence of prior infarction or pharmacologically induced ischemia. 2. Normal left ventricular wall motion. 3. Left ventricular ejection fraction 60%. 4. Low risk stress test findings.  Echo 01/13/14 [facility] Everywhere): Interpretation Summary The left ventricle is normal in size. There is normal left ventricular wall thickness. The aortic valve is not well visualized, but is grossly normal. The left ventricular wall motion is normal. The left ventricular ejection fraction is normal (70-75%). The left atrium is mildly dilated. The left ventricular diastolic function is normal.  Cardiac cath 12/30/09: Impression:  No angiographic coronary disease, slow flow down the LAD suggesting microvascular dysfunction. LV systolic dysfunction was severely depressed by echo and there was moderately elevated left ventricular end-diastolic pressure. This appears to be a nonischemic cardiomyopathy. It is possible that this is a stress (Takotsubo type) cardiomyopathy. We will plan on starting the patient on milrinone drip at 0.25 mcg/kg to support her left ventricle. However should start when she has been out for 4 hours due to the possibility of an LV apical thrombus.  CXR 08/30/16: IMPRESSION: No acute disease.  Preoperative labs noted. Cr 0.92. AST/ALT 14/14. CBC WNL. Glucose 100.   Patient has medical clearance. She does have a history of non-ischemic stress inducted cardiomyopathy in 2011. No CAD at that time. Stress test was non-ischemic last year with normal LVEF. If no acute changes then I anticipate that she can proceed as planned.  Velna Ochs [facility] Short Stay Center/Anesthesiology Phone [phone] 05/06/2017 1:45 PM

## 2017-05-10 MED ORDER — BUPIVACAINE LIPOSOME 1.3 % IJ SUSP
20.0000 mL | INTRAMUSCULAR | Status: AC
  Administered 2017-05-13: 20 mL
  Filled 2017-05-10: qty 20

## 2017-05-10 MED ORDER — ACETAMINOPHEN 500 MG PO TABS
1000.0000 mg | ORAL_TABLET | Freq: Once | ORAL | Status: AC
  Administered 2017-05-13: 1000 mg via ORAL
  Filled 2017-05-10: qty 2

## 2017-05-10 MED ORDER — DEXAMETHASONE SODIUM PHOSPHATE 10 MG/ML IJ SOLN
8.0000 mg | Freq: Once | INTRAMUSCULAR | Status: AC
  Administered 2017-05-13: 8 mg via INTRAVENOUS
  Filled 2017-05-10: qty 1

## 2017-05-10 MED ORDER — DEXTROSE 5 % IV SOLN
3.0000 g | INTRAVENOUS | Status: AC
  Administered 2017-05-13: 3 g via INTRAVENOUS
  Filled 2017-05-10: qty 3000

## 2017-05-10 MED ORDER — TRANEXAMIC ACID 1000 MG/10ML IV SOLN
1000.0000 mg | INTRAVENOUS | Status: AC
  Administered 2017-05-13: 1000 mg via INTRAVENOUS
  Filled 2017-05-10: qty 1100

## 2017-05-10 MED ORDER — GABAPENTIN 300 MG PO CAPS
300.0000 mg | ORAL_CAPSULE | Freq: Once | ORAL | Status: AC
  Administered 2017-05-13: 300 mg via ORAL
  Filled 2017-05-10: qty 1

## 2017-05-13 ENCOUNTER — Inpatient Hospital Stay (HOSPITAL_COMMUNITY)
Admission: RE | Admit: 2017-05-13 | Discharge: 2017-05-16 | DRG: 470 | Disposition: A | Discharge status: Home Health | Payer: Medicare Other | Source: Ambulatory Visit | Attending: Orthopedic Surgery | Admitting: Orthopedic Surgery

## 2017-05-13 ENCOUNTER — Encounter (HOSPITAL_COMMUNITY): Payer: Self-pay

## 2017-05-13 ENCOUNTER — Ambulatory Visit (HOSPITAL_COMMUNITY): Payer: Medicare Other | Admitting: Anesthesiology

## 2017-05-13 ENCOUNTER — Ambulatory Visit (HOSPITAL_COMMUNITY): Payer: Medicare Other | Admitting: Emergency Medicine

## 2017-05-13 ENCOUNTER — Encounter (HOSPITAL_COMMUNITY)
Admission: RE | Disposition: A | Discharge status: Home Health | Payer: Self-pay | Source: Ambulatory Visit | Attending: Orthopedic Surgery

## 2017-05-13 DIAGNOSIS — I428 Other cardiomyopathies: Secondary | ICD-10-CM | POA: Diagnosis not present

## 2017-05-13 DIAGNOSIS — M1711 Unilateral primary osteoarthritis, right knee: Principal | ICD-10-CM | POA: Diagnosis present

## 2017-05-13 DIAGNOSIS — F419 Anxiety disorder, unspecified: Secondary | ICD-10-CM | POA: Diagnosis present

## 2017-05-13 DIAGNOSIS — I509 Heart failure, unspecified: Secondary | ICD-10-CM | POA: Diagnosis present

## 2017-05-13 DIAGNOSIS — F418 Other specified anxiety disorders: Secondary | ICD-10-CM | POA: Diagnosis not present

## 2017-05-13 DIAGNOSIS — Z79899 Other long term (current) drug therapy: Secondary | ICD-10-CM

## 2017-05-13 DIAGNOSIS — Z96659 Presence of unspecified artificial knee joint: Secondary | ICD-10-CM

## 2017-05-13 DIAGNOSIS — F319 Bipolar disorder, unspecified: Secondary | ICD-10-CM | POA: Diagnosis present

## 2017-05-13 DIAGNOSIS — G8918 Other acute postprocedural pain: Secondary | ICD-10-CM | POA: Diagnosis not present

## 2017-05-13 DIAGNOSIS — I110 Hypertensive heart disease with heart failure: Secondary | ICD-10-CM | POA: Diagnosis not present

## 2017-05-13 DIAGNOSIS — I10 Essential (primary) hypertension: Secondary | ICD-10-CM | POA: Diagnosis not present

## 2017-05-13 HISTORY — PX: TOTAL KNEE ARTHROPLASTY: SHX125

## 2017-05-13 HISTORY — DX: Presence of unspecified artificial knee joint: Z96.659

## 2017-05-13 SURGERY — ARTHROPLASTY, KNEE, TOTAL
Anesthesia: Spinal | Site: Knee | Laterality: Right

## 2017-05-13 MED ORDER — ROPIVACAINE HCL 5 MG/ML IJ SOLN
INTRAMUSCULAR | Status: DC | PRN
  Administered 2017-05-13: 30 mL via PERINEURAL

## 2017-05-13 MED ORDER — CEFAZOLIN SODIUM-DEXTROSE 1-4 GM/50ML-% IV SOLN
1.0000 g | Freq: Four times a day (QID) | INTRAVENOUS | Status: AC
  Administered 2017-05-13 (×2): 1 g via INTRAVENOUS
  Filled 2017-05-13 (×2): qty 50

## 2017-05-13 MED ORDER — ACETAMINOPHEN 325 MG PO TABS
650.0000 mg | ORAL_TABLET | Freq: Four times a day (QID) | ORAL | Status: DC | PRN
  Administered 2017-05-13 – 2017-05-14 (×2): 650 mg via ORAL
  Filled 2017-05-13 (×2): qty 2

## 2017-05-13 MED ORDER — BUPIVACAINE-EPINEPHRINE (PF) 0.25% -1:200000 IJ SOLN
INTRAMUSCULAR | Status: AC
  Filled 2017-05-13: qty 30

## 2017-05-13 MED ORDER — HYDROCODONE-ACETAMINOPHEN 10-325 MG PO TABS
1.0000 | ORAL_TABLET | ORAL | Status: DC | PRN
  Administered 2017-05-13 – 2017-05-16 (×13): 2 via ORAL
  Filled 2017-05-13 (×13): qty 2

## 2017-05-13 MED ORDER — ASPIRIN EC 325 MG PO TBEC
325.0000 mg | DELAYED_RELEASE_TABLET | Freq: Two times a day (BID) | ORAL | Status: DC
  Administered 2017-05-13 – 2017-05-16 (×6): 325 mg via ORAL
  Filled 2017-05-13 (×6): qty 1

## 2017-05-13 MED ORDER — OXCARBAZEPINE 300 MG PO TABS
300.0000 mg | ORAL_TABLET | Freq: Two times a day (BID) | ORAL | Status: DC
  Administered 2017-05-13 – 2017-05-16 (×6): 300 mg via ORAL
  Filled 2017-05-13 (×6): qty 1

## 2017-05-13 MED ORDER — PHENOL 1.4 % MT LIQD: 1.0000 | OROMUCOSAL | Status: DC | PRN

## 2017-05-13 MED ORDER — FLEET ENEMA 7-19 GM/118ML RE ENEM: 1.0000 | ENEMA | Freq: Once | RECTAL | Status: DC | PRN

## 2017-05-13 MED ORDER — BISACODYL 5 MG PO TBEC: 5.0000 mg | DELAYED_RELEASE_TABLET | Freq: Every day | ORAL | Status: DC | PRN

## 2017-05-13 MED ORDER — FENTANYL CITRATE (PF) 250 MCG/5ML IJ SOLN
INTRAMUSCULAR | Status: AC
  Filled 2017-05-13: qty 5

## 2017-05-13 MED ORDER — FENTANYL CITRATE (PF) 100 MCG/2ML IJ SOLN
INTRAMUSCULAR | Status: AC
  Administered 2017-05-13: 100 ug via INTRAVENOUS
  Filled 2017-05-13: qty 2

## 2017-05-13 MED ORDER — PHENYLEPHRINE HCL 10 MG/ML IJ SOLN
INTRAVENOUS | Status: DC | PRN
  Administered 2017-05-13: 10 ug/min via INTRAVENOUS

## 2017-05-13 MED ORDER — MIDAZOLAM HCL 2 MG/2ML IJ SOLN
INTRAMUSCULAR | Status: AC
  Administered 2017-05-13: 2 mg via INTRAVENOUS
  Filled 2017-05-13: qty 2

## 2017-05-13 MED ORDER — MEPERIDINE HCL 25 MG/ML IJ SOLN: 6.2500 mg | INTRAMUSCULAR | Status: DC | PRN

## 2017-05-13 MED ORDER — PROPOFOL 500 MG/50ML IV EMUL
INTRAVENOUS | Status: DC | PRN
  Administered 2017-05-13: 75 ug/kg/min via INTRAVENOUS

## 2017-05-13 MED ORDER — ONDANSETRON HCL 4 MG PO TABS: 4.0000 mg | ORAL_TABLET | Freq: Four times a day (QID) | ORAL | Status: DC | PRN

## 2017-05-13 MED ORDER — BUPIVACAINE IN DEXTROSE 0.75-8.25 % IT SOLN
INTRATHECAL | Status: DC | PRN
  Administered 2017-05-13: 2 mL via INTRATHECAL

## 2017-05-13 MED ORDER — PROPOFOL 10 MG/ML IV BOLUS
INTRAVENOUS | Status: DC | PRN
  Administered 2017-05-13: 20 mg via INTRAVENOUS

## 2017-05-13 MED ORDER — SODIUM CHLORIDE 0.9 % IJ SOLN
INTRAMUSCULAR | Status: DC | PRN
  Administered 2017-05-13: 20 mL

## 2017-05-13 MED ORDER — ATENOLOL 50 MG PO TABS
50.0000 mg | ORAL_TABLET | Freq: Every day | ORAL | Status: DC
  Administered 2017-05-13 – 2017-05-16 (×4): 50 mg via ORAL
  Filled 2017-05-13 (×4): qty 1

## 2017-05-13 MED ORDER — PROMETHAZINE HCL 25 MG/ML IJ SOLN: 6.2500 mg | INTRAMUSCULAR | Status: DC | PRN

## 2017-05-13 MED ORDER — METOCLOPRAMIDE HCL 5 MG PO TABS
5.0000 mg | ORAL_TABLET | Freq: Three times a day (TID) | ORAL | Status: DC | PRN
  Administered 2017-05-15: 10 mg via ORAL

## 2017-05-13 MED ORDER — MIDAZOLAM HCL 2 MG/2ML IJ SOLN
INTRAMUSCULAR | Status: AC
  Filled 2017-05-13: qty 2

## 2017-05-13 MED ORDER — ALUM & MAG HYDROXIDE-SIMETH 200-200-20 MG/5ML PO SUSP: 30.0000 mL | ORAL | Status: DC | PRN

## 2017-05-13 MED ORDER — CHLORHEXIDINE GLUCONATE 4 % EX LIQD: 60.0000 mL | Freq: Once | CUTANEOUS | Status: AC

## 2017-05-13 MED ORDER — METHOCARBAMOL 500 MG PO TABS
500.0000 mg | ORAL_TABLET | Freq: Four times a day (QID) | ORAL | Status: DC | PRN
  Administered 2017-05-13 – 2017-05-16 (×9): 500 mg via ORAL
  Filled 2017-05-13 (×9): qty 1

## 2017-05-13 MED ORDER — METHOCARBAMOL 1000 MG/10ML IJ SOLN
500.0000 mg | Freq: Four times a day (QID) | INTRAMUSCULAR | Status: DC | PRN
  Filled 2017-05-13: qty 5

## 2017-05-13 MED ORDER — MIDAZOLAM HCL 2 MG/2ML IJ SOLN
2.0000 mg | Freq: Once | INTRAMUSCULAR | Status: AC
  Administered 2017-05-13: 2 mg via INTRAVENOUS

## 2017-05-13 MED ORDER — TRAMADOL HCL 50 MG PO TABS
50.0000 mg | ORAL_TABLET | Freq: Four times a day (QID) | ORAL | Status: DC
  Administered 2017-05-13 – 2017-05-16 (×12): 100 mg via ORAL
  Filled 2017-05-13 (×12): qty 2

## 2017-05-13 MED ORDER — TRAZODONE HCL 50 MG PO TABS
50.0000 mg | ORAL_TABLET | Freq: Every day | ORAL | Status: DC
  Administered 2017-05-13 – 2017-05-15 (×3): 50 mg via ORAL
  Filled 2017-05-13 (×3): qty 1

## 2017-05-13 MED ORDER — DEXAMETHASONE SODIUM PHOSPHATE 10 MG/ML IJ SOLN
10.0000 mg | Freq: Once | INTRAMUSCULAR | Status: AC
  Administered 2017-05-14: 10 mg via INTRAVENOUS
  Filled 2017-05-13: qty 1

## 2017-05-13 MED ORDER — DULOXETINE HCL 30 MG PO CPEP
30.0000 mg | ORAL_CAPSULE | Freq: Two times a day (BID) | ORAL | Status: DC
Start: 2017-05-13 — End: 2017-05-16
  Administered 2017-05-13 – 2017-05-16 (×6): 30 mg via ORAL
  Filled 2017-05-13 (×7): qty 1

## 2017-05-13 MED ORDER — ONDANSETRON HCL 4 MG/2ML IJ SOLN
INTRAMUSCULAR | Status: DC | PRN
  Administered 2017-05-13: 4 mg via INTRAVENOUS

## 2017-05-13 MED ORDER — TRANEXAMIC ACID 1000 MG/10ML IV SOLN
1000.0000 mg | Freq: Once | INTRAVENOUS | Status: AC
  Administered 2017-05-13: 1000 mg via INTRAVENOUS
  Filled 2017-05-13: qty 10

## 2017-05-13 MED ORDER — CELECOXIB 200 MG PO CAPS
200.0000 mg | ORAL_CAPSULE | Freq: Two times a day (BID) | ORAL | Status: DC
  Administered 2017-05-13 – 2017-05-16 (×6): 200 mg via ORAL
  Filled 2017-05-13 (×6): qty 1

## 2017-05-13 MED ORDER — ACETAMINOPHEN 650 MG RE SUPP: 650.0000 mg | Freq: Four times a day (QID) | RECTAL | Status: DC | PRN

## 2017-05-13 MED ORDER — HYDROMORPHONE HCL 1 MG/ML IJ SOLN
0.2500 mg | INTRAMUSCULAR | Status: DC | PRN
Start: 2017-05-13 — End: 2017-05-13

## 2017-05-13 MED ORDER — HYDROMORPHONE HCL 1 MG/ML IJ SOLN
1.0000 mg | INTRAMUSCULAR | Status: DC | PRN
  Administered 2017-05-13 – 2017-05-15 (×15): 1 mg via INTRAVENOUS
  Filled 2017-05-13 (×16): qty 1

## 2017-05-13 MED ORDER — LACTATED RINGERS IV SOLN
INTRAVENOUS | Status: DC
  Administered 2017-05-13: 08:00:00 via INTRAVENOUS

## 2017-05-13 MED ORDER — CHLORTHALIDONE 25 MG PO TABS
25.0000 mg | ORAL_TABLET | Freq: Every day | ORAL | Status: DC
  Administered 2017-05-13 – 2017-05-16 (×4): 25 mg via ORAL
  Filled 2017-05-13 (×4): qty 1

## 2017-05-13 MED ORDER — METOCLOPRAMIDE HCL 5 MG/ML IJ SOLN: 5.0000 mg | Freq: Three times a day (TID) | INTRAMUSCULAR | Status: DC | PRN

## 2017-05-13 MED ORDER — ATENOLOL-CHLORTHALIDONE 50-25 MG PO TABS: 1.0000 | ORAL_TABLET | Freq: Every day | ORAL | Status: DC

## 2017-05-13 MED ORDER — GABAPENTIN 300 MG PO CAPS
300.0000 mg | ORAL_CAPSULE | Freq: Three times a day (TID) | ORAL | Status: DC
  Administered 2017-05-13 – 2017-05-16 (×9): 300 mg via ORAL
  Filled 2017-05-13 (×9): qty 1

## 2017-05-13 MED ORDER — MIDAZOLAM HCL 5 MG/5ML IJ SOLN
INTRAMUSCULAR | Status: DC | PRN
  Administered 2017-05-13: 2 mg via INTRAVENOUS

## 2017-05-13 MED ORDER — DOCUSATE SODIUM 100 MG PO CAPS
100.0000 mg | ORAL_CAPSULE | Freq: Two times a day (BID) | ORAL | Status: DC
  Administered 2017-05-13 – 2017-05-16 (×7): 100 mg via ORAL
  Filled 2017-05-13 (×7): qty 1

## 2017-05-13 MED ORDER — DIPHENHYDRAMINE HCL 12.5 MG/5ML PO ELIX: 12.5000 mg | ORAL_SOLUTION | ORAL | Status: DC | PRN

## 2017-05-13 MED ORDER — ARIPIPRAZOLE 5 MG PO TABS
10.0000 mg | ORAL_TABLET | Freq: Every day | ORAL | Status: DC
  Administered 2017-05-13 – 2017-05-15 (×3): 10 mg via ORAL
  Filled 2017-05-13 (×4): qty 2

## 2017-05-13 MED ORDER — MIDAZOLAM HCL 2 MG/2ML IJ SOLN: 0.5000 mg | Freq: Once | INTRAMUSCULAR | Status: DC | PRN

## 2017-05-13 MED ORDER — MENTHOL 3 MG MT LOZG: 1.0000 | LOZENGE | OROMUCOSAL | Status: DC | PRN

## 2017-05-13 MED ORDER — ONDANSETRON HCL 4 MG/2ML IJ SOLN
INTRAMUSCULAR | Status: AC
  Filled 2017-05-13: qty 2

## 2017-05-13 MED ORDER — ONDANSETRON HCL 4 MG/2ML IJ SOLN: 4.0000 mg | Freq: Four times a day (QID) | INTRAMUSCULAR | Status: DC | PRN

## 2017-05-13 MED ORDER — SODIUM CHLORIDE 0.9 % IR SOLN
Status: DC | PRN
  Administered 2017-05-13: 3000 mL

## 2017-05-13 MED ORDER — SENNOSIDES-DOCUSATE SODIUM 8.6-50 MG PO TABS: 1.0000 | ORAL_TABLET | Freq: Every evening | ORAL | Status: DC | PRN

## 2017-05-13 MED ORDER — 0.9 % SODIUM CHLORIDE (POUR BTL) OPTIME
TOPICAL | Status: DC | PRN
  Administered 2017-05-13: 1000 mL

## 2017-05-13 MED ORDER — ZOLPIDEM TARTRATE 5 MG PO TABS: 5.0000 mg | ORAL_TABLET | Freq: Every evening | ORAL | Status: DC | PRN

## 2017-05-13 MED ORDER — PROPOFOL 10 MG/ML IV BOLUS
INTRAVENOUS | Status: AC
  Filled 2017-05-13: qty 20

## 2017-05-13 MED ORDER — FENTANYL CITRATE (PF) 100 MCG/2ML IJ SOLN
100.0000 ug | Freq: Once | INTRAMUSCULAR | Status: AC
  Administered 2017-05-13: 100 ug via INTRAVENOUS

## 2017-05-13 MED ORDER — BUPIVACAINE-EPINEPHRINE (PF) 0.25% -1:200000 IJ SOLN
INTRAMUSCULAR | Status: DC | PRN
Start: 2017-05-13 — End: 2017-05-13
  Administered 2017-05-13: 30 mL

## 2017-05-13 SURGICAL SUPPLY — 63 items
BANDAGE ACE 6X5 VEL STRL LF (GAUZE/BANDAGES/DRESSINGS) ×3 IMPLANT
BANDAGE ELASTIC 6 VELCRO ST LF (GAUZE/BANDAGES/DRESSINGS) ×3 IMPLANT
BANDAGE ESMARK 6X9 LF (GAUZE/BANDAGES/DRESSINGS) ×1 IMPLANT
BLADE SAGITTAL 13X1.27X60 (BLADE) ×2 IMPLANT
BLADE SAGITTAL 13X1.27X60MM (BLADE) ×1
BLADE SAW SGTL 83.5X18.5 (BLADE) ×3 IMPLANT
BLADE SURG 10 STRL SS (BLADE) ×3 IMPLANT
BNDG ESMARK 6X9 LF (GAUZE/BANDAGES/DRESSINGS) ×3
BOWL SMART MIX CTS (DISPOSABLE) ×3 IMPLANT
CAPT KNEE TOTAL 3 ×3 IMPLANT
CEMENT BONE SIMPLEX SPEEDSET (Cement) ×6 IMPLANT
CLOSURE WOUND 1/2 X4 (GAUZE/BANDAGES/DRESSINGS) ×1
COVER SURGICAL LIGHT HANDLE (MISCELLANEOUS) ×3 IMPLANT
CUFF TOURNIQUET SINGLE 34IN LL (TOURNIQUET CUFF) ×3 IMPLANT
DRAPE EXTREMITY T 121X128X90 (DRAPE) ×3 IMPLANT
DRAPE HALF SHEET 40X57 (DRAPES) ×3 IMPLANT
DRAPE INCISE IOBAN 66X45 STRL (DRAPES) ×6 IMPLANT
DRAPE U-SHAPE 47X51 STRL (DRAPES) ×3 IMPLANT
DRSG AQUACEL AG ADV 3.5X10 (GAUZE/BANDAGES/DRESSINGS) ×3 IMPLANT
DURAPREP 26ML APPLICATOR (WOUND CARE) ×6 IMPLANT
ELECT CAUTERY BLADE 6.4 (BLADE) ×3 IMPLANT
ELECT REM PT RETURN 9FT ADLT (ELECTROSURGICAL) ×3
ELECTRODE REM PT RTRN 9FT ADLT (ELECTROSURGICAL) ×1 IMPLANT
FILTER STRAW FLUID ASPIR (MISCELLANEOUS) IMPLANT
GLOVE BIOGEL M 7.0 STRL (GLOVE) IMPLANT
GLOVE BIOGEL PI IND STRL 7.5 (GLOVE) IMPLANT
GLOVE BIOGEL PI IND STRL 8.5 (GLOVE) ×1 IMPLANT
GLOVE BIOGEL PI INDICATOR 7.5 (GLOVE)
GLOVE BIOGEL PI INDICATOR 8.5 (GLOVE) ×2
GLOVE SURG ORTHO 8.0 STRL STRW (GLOVE) ×9 IMPLANT
GOWN STRL REUS W/ TWL LRG LVL3 (GOWN DISPOSABLE) ×1 IMPLANT
GOWN STRL REUS W/ TWL XL LVL3 (GOWN DISPOSABLE) ×2 IMPLANT
GOWN STRL REUS W/TWL 2XL LVL3 (GOWN DISPOSABLE) ×3 IMPLANT
GOWN STRL REUS W/TWL LRG LVL3 (GOWN DISPOSABLE) ×2
GOWN STRL REUS W/TWL XL LVL3 (GOWN DISPOSABLE) ×4
HANDPIECE INTERPULSE COAX TIP (DISPOSABLE) ×2
HOOD PEEL AWAY FACE SHEILD DIS (HOOD) ×9 IMPLANT
KIT BASIN OR (CUSTOM PROCEDURE TRAY) ×3 IMPLANT
KIT ROOM TURNOVER OR (KITS) ×3 IMPLANT
KNEE CAPITATED TOTAL 3 ×1 IMPLANT
MANIFOLD NEPTUNE II (INSTRUMENTS) ×3 IMPLANT
NEEDLE 18GX1X1/2 (RX/OR ONLY) (NEEDLE) IMPLANT
NEEDLE 22X1 1/2 (OR ONLY) (NEEDLE) ×6 IMPLANT
NS IRRIG 1000ML POUR BTL (IV SOLUTION) ×3 IMPLANT
PACK TOTAL JOINT (CUSTOM PROCEDURE TRAY) ×3 IMPLANT
PAD ARMBOARD 7.5X6 YLW CONV (MISCELLANEOUS) ×6 IMPLANT
SET HNDPC FAN SPRY TIP SCT (DISPOSABLE) ×1 IMPLANT
STRIP CLOSURE SKIN 1/2X4 (GAUZE/BANDAGES/DRESSINGS) ×2 IMPLANT
SUCTION FRAZIER HANDLE 10FR (MISCELLANEOUS)
SUCTION TUBE FRAZIER 10FR DISP (MISCELLANEOUS) IMPLANT
SUT BONE WAX W31G (SUTURE) ×3 IMPLANT
SUT MNCRL AB 3-0 PS2 18 (SUTURE) ×3 IMPLANT
SUT VIC AB 0 CTB1 27 (SUTURE) ×6 IMPLANT
SUT VIC AB 1 CT1 27 (SUTURE) ×6
SUT VIC AB 1 CT1 27XBRD ANBCTR (SUTURE) ×3 IMPLANT
SUT VIC AB 2-0 CT1 27 (SUTURE) ×4
SUT VIC AB 2-0 CT1 TAPERPNT 27 (SUTURE) ×2 IMPLANT
SYR 20CC LL (SYRINGE) ×6 IMPLANT
SYR TB 1ML LUER SLIP (SYRINGE) IMPLANT
TOWEL OR 17X24 6PK STRL BLUE (TOWEL DISPOSABLE) ×3 IMPLANT
TOWEL OR 17X26 10 PK STRL BLUE (TOWEL DISPOSABLE) ×3 IMPLANT
TRAY CATH 16FR W/PLASTIC CATH (SET/KITS/TRAYS/PACK) IMPLANT
WRAP KNEE MAXI GEL POST OP (GAUZE/BANDAGES/DRESSINGS) ×3 IMPLANT

## 2017-05-13 NOTE — Anesthesia Procedure Notes (Signed)
Spinal  Patient location during procedure: OR End time: 05/13/2017 10:24 AM Staffing Anesthesiologist: Jairo Ben Performed: anesthesiologist  Preanesthetic Checklist Completed: patient identified, site marked, surgical consent, pre-op evaluation, timeout performed, IV checked, risks and benefits discussed and monitors and equipment checked Spinal Block Patient position: sitting Prep: ChloraPrep and site prepped and draped Patient monitoring: blood pressure, continuous pulse ox, cardiac monitor and heart rate Approach: midline Location: L3-4 Injection technique: single-shot Needle Needle type: Quincke  Needle gauge: 25 G Needle length: 9 cm Additional Notes Pt identified in Operating room.  Monitors applied. Working IV access confirmed. Sterile prep, drape lumbar spine.  1% lido local L 3,4.  #25ga Quincke into clear CSF L 3,4.  15mg  0.75% Bupivacaine with dextrose injected with asp CSF beginning and end of injection.  Patient asymptomatic, VSS, no heme aspirated, tolerated well.  Sandford Craze, MD

## 2017-05-13 NOTE — Anesthesia Preprocedure Evaluation (Addendum)
Anesthesia Evaluation  Patient identified by MRN, date of birth, ID band Patient awake    Reviewed: Allergy & Precautions, NPO status , Patient's Chart, lab work & pertinent test results, reviewed documented beta blocker date and time   History of Anesthesia Complications Negative for: history of anesthetic complications  Airway Mallampati: II  TM Distance: >3 FB Neck ROM: Full    Dental  (+) Dental Advisory Given   Pulmonary neg pulmonary ROS,    breath sounds clear to auscultation       Cardiovascular hypertension, Pt. on medications and Pt. on home beta blockers (-) angina+ Past MI (h/o Takotsubo event 2011, fully recovered)  (-) CAD  Rhythm:Regular Rate:Normal  '17 ECHO: EF >55%   Neuro/Psych Seizures - (remote history), Well Controlled,  Anxiety Depression Bipolar Disorder    GI/Hepatic negative GI ROS, Neg liver ROS,   Endo/Other  Morbid obesity  Renal/GU negative Renal ROS     Musculoskeletal  (+) Arthritis ,   Abdominal (+) + obese,   Peds  Hematology negative hematology ROS (+)   Anesthesia Other Findings   Reproductive/Obstetrics Post-menopausal, no sexual relations                            Anesthesia Physical Anesthesia Plan  ASA: II  Anesthesia Plan: Spinal   Post-op Pain Management:  Regional for Post-op pain   Induction:   PONV Risk Score and Plan: 2 and Ondansetron and Dexamethasone  Airway Management Planned: Natural Airway and Simple Face Mask  Additional Equipment:   Intra-op Plan:   Post-operative Plan:   Informed Consent: I have reviewed the patients History and Physical, chart, labs and discussed the procedure including the risks, benefits and alternatives for the proposed anesthesia with the patient or authorized representative who has indicated his/her understanding and acceptance.   Dental advisory given  Plan Discussed with: CRNA and  Surgeon  Anesthesia Plan Comments: (Plan routine monitors, SAB with adductor canal block for post op analgesia)        Anesthesia Quick Evaluation

## 2017-05-13 NOTE — OR Nursing (Signed)
1205: in&out cath=300cc cyu, per protocol, no trauma

## 2017-05-13 NOTE — Progress Notes (Signed)
Orthopedic Tech Progress Note Patient Details:  Cynthia Richardson Mar 20, 1966 [id number]  CPM Right Knee CPM Right Knee: On Right Knee Flexion (Degrees): 90 Right Knee Extension (Degrees): 0 Additional Comments: foot roll   [name] 05/13/2017, 12:53 PM

## 2017-05-13 NOTE — Anesthesia Procedure Notes (Signed)
Anesthesia Regional Block: Adductor canal block   Pre-Anesthetic Checklist: ,, timeout performed, Correct Patient, Correct Site, Correct Laterality, Correct Procedure, Correct Position, site marked, Risks and benefits discussed,  Surgical consent,  Pre-op evaluation,  At surgeon's request and post-op pain management  Laterality: Right and Lower  Prep: chloraprep       Needles:  Injection technique: Single-shot  Needle Type: Echogenic Needle     Needle Length: 9cm  Needle Gauge: 21     Additional Needles:   Procedures: ultrasound guided,,,,,,,,  Narrative:  Start time: 05/13/2017 8:51 AM End time: 05/13/2017 9:02 AM Injection made incrementally with aspirations every 5 mL.  Performed by: Personally  Anesthesiologist: Jean Rosenthal, Leondre Taul  Additional Notes: Pt identified in Holding room.  Monitors applied. Working IV access confirmed. Sterile prep, drape R thigh.  #21ga ECHOgenic needle into adductor canal with US guidance.  30cc 0.5% Ropivacaine injected incrementally after negative test dose.  Patient asymptomatic, VSS, no heme aspirated, tolerated well.  Sandford Craze, MD

## 2017-05-13 NOTE — H&P (Signed)
MOYINOLUWA DAWE MRN:  [id number] DOB/SEX:  08-15-1966/female  CHIEF COMPLAINT:  Painful right Knee  HISTORY: Patient is a 51 y.o. female presented with a history of pain in the right knee. Onset of symptoms was gradual starting a few years ago with gradually worsening course since that time. Patient has been treated conservatively with over-the-counter NSAIDs and activity modification. Patient currently rates pain in the knee at 10 out of 10 with activity. There is pain at night.  PAST MEDICAL HISTORY: Patient Active Problem List   Diagnosis Date Noted  . CEREBRAL EDEMA 01/19/2010  . CARDIOMYOPATHY 01/19/2010  . PULMONARY EDEMA 01/19/2010  . RESPIRATORY FAILURE 01/19/2010  . RENAL FAILURE 01/19/2010   Past Medical History:  Diagnosis Date  . Anxiety   . Arthritis   . Bipolar disorder (HCC)   . Bulging lumbar disc   . CHF (congestive heart failure) (HCC)    2011  . Chronic back pain   . Degenerative disc disease   . Depression   . History of kidney stones   . Hypertension   . Hypertensive encephalopathy   . Nonischemic cardiomyopathy (HCC)    stress inducted (Takotsubo type) 12/2009 in the setting of unintentional drug OD with acute respiratory and renal failure; No CAD by 12/2009 cath; EF recovered (> 55% '15, '17)  . Osteoarthritis of knees, bilateral   . Pneumonia   . Respiratory failure [facility])    March 2011  . Seizures (HCC)    Past Surgical History:  Procedure Laterality Date  . ABDOMINAL HYSTERECTOMY    . APPENDECTOMY    . CHOLECYSTECTOMY    . TONSILLECTOMY       MEDICATIONS:   No prescriptions prior to admission.    ALLERGIES:   Allergies  Allergen Reactions  . No Known Allergies     REVIEW OF SYSTEMS:  A comprehensive review of systems was negative except for: Musculoskeletal: positive for arthralgias and bone pain   FAMILY HISTORY:   Family History  Problem Relation Age of Onset  . Emphysema Mother   . Hypertension Mother   . Liver disease Father      SOCIAL HISTORY:   Social History  Substance Use Topics  . Smoking status: Never Smoker  . Smokeless tobacco: Never Used  . Alcohol use No     EXAMINATION:  Vital signs in last 24 hours:    There were no vitals taken for this visit.  General Appearance:    Alert, cooperative, no distress, appears stated age  Head:    Normocephalic, without obvious abnormality, atraumatic  Eyes:    PERRL, conjunctiva/corneas clear, EOM's intact, fundi    benign, both eyes  Ears:    Normal TM's and external ear canals, both ears  Nose:   Nares normal, septum midline, mucosa normal, no drainage    or sinus tenderness  Throat:   Lips, mucosa, and tongue normal; teeth and gums normal  Neck:   Supple, symmetrical, trachea midline, no adenopathy;    thyroid:  no enlargement/tenderness/nodules; no carotid   bruit or JVD  Back:     Symmetric, no curvature, ROM normal, no CVA tenderness  Lungs:     Clear to auscultation bilaterally, respirations unlabored  Chest Wall:    No tenderness or deformity   Heart:    Regular rate and rhythm, S1 and S2 normal, no murmur, rub   or gallop  Breast Exam:    No tenderness, masses, or nipple abnormality  Abdomen:  Soft, non-tender, bowel sounds active all four quadrants,    no masses, no organomegaly  Genitalia:    Normal female without lesion, discharge or tenderness  Rectal:    Normal tone, no masses or tenderness;   guaiac negative stool  Extremities:   Extremities normal, atraumatic, no cyanosis or edema  Pulses:   2+ and symmetric all extremities  Skin:   Skin color, texture, turgor normal, no rashes or lesions  Lymph nodes:   Cervical, supraclavicular, and axillary nodes normal  Neurologic:   CNII-XII intact, normal strength, sensation and reflexes    throughout    Musculoskeletal:  ROM 0-120, Ligaments intact,  Imaging Review Plain radiographs demonstrate severe degenerative joint disease of the right knee. The overall alignment is neutral. The  bone quality appears to be good for age and reported activity level.  Assessment/Plan: Primary osteoarthritis, right knee   The patient history, physical examination and imaging studies are consistent with advanced degenerative joint disease of the right knee. The patient has failed conservative treatment.  The clearance notes were reviewed.  After discussion with the patient it was felt that Total Knee Replacement was indicated. The procedure,  risks, and benefits of total knee arthroplasty were presented and reviewed. The risks including but not limited to aseptic loosening, infection, blood clots, vascular injury, stiffness, patella tracking problems complications among others were discussed. The patient acknowledged the explanation, agreed to proceed with the plan.  Guy Sandifer 05/13/2017, 6:29 AM

## 2017-05-13 NOTE — Evaluation (Signed)
Physical Therapy Evaluation Patient Details Name: Cynthia Richardson MRN: [id number] DOB: August 18, 1966 Today's Date: 05/13/2017   History of Present Illness  Pt is a 51 y/o female s/p elective R TKA. PMH includes bipolar disorder, depression, CHF, anxiety, HTN, and nonischemic cardiomyopathy with respiratory failure in 2011.   Clinical Impression  Pt is s/p surgery above with deficits below. PTA, pt was independent with ambulation. Upon eval, pt extremely limited by pain, as well as, weakness. Only tolerated short ambulation distance and few exercises with LLE. Required min guard to min A for mobility this session. Reports her roommate will be able to assist as needed and will need DME below. Follow up PT per MD arrangements. Will continue to follow acutely to maximize functional mobility independence and safety.      Follow Up Recommendations DC plan and follow up therapy as arranged by surgeon;Supervision/Assistance - 24 hour    Equipment Recommendations  Rolling walker with 5" wheels;3in1 (PT)    Recommendations for Other Services       Precautions / Restrictions Precautions Precautions: Knee Precaution Booklet Issued: Yes (comment) Precaution Comments: Reviewed supine ther ex with pt.  Restrictions Weight Bearing Restrictions: Yes RLE Weight Bearing: Weight bearing as tolerated      Mobility  Bed Mobility Overal bed mobility: Needs Assistance Bed Mobility: Supine to Sit     Supine to sit: Min assist     General bed mobility comments: Min A for RLE management. Increased time secondary to pain. Use of bed rails and elevated HOB.   Transfers Overall transfer level: Needs assistance Equipment used: Rolling walker (2 wheeled) Transfers: Sit to/from Stand Sit to Stand: Min assist         General transfer comment: Min A for lift assist and steadying upon standing. Verbal cues for safe hand placement.   Ambulation/Gait Ambulation/Gait assistance: Min guard Ambulation Distance  (Feet): 10 Feet Assistive device: Rolling walker (2 wheeled) Gait Pattern/deviations: Step-to pattern;Decreased step length - right;Decreased step length - left;Decreased weight shift to right;Antalgic Gait velocity: Decreased.  Gait velocity interpretation: Below normal speed for age/gender General Gait Details: Slow, antalgic gait secondary to post op pain and weakness. Unable to tolerate longer gait secondary to pain. Verbal cues for appropriate sequencing with RW. Very short steps with RLE and unable to increase step length secondary to pain.   Stairs            Wheelchair Mobility    Modified Rankin (Stroke Patients Only)       Balance Overall balance assessment: Needs assistance Sitting-balance support: No upper extremity supported;Feet supported Sitting balance-Leahy Scale: Good     Standing balance support: Bilateral upper extremity supported;During functional activity Standing balance-Leahy Scale: Poor Standing balance comment: Reliant on UE support on RW.                              Pertinent Vitals/Pain Pain Assessment: 0-10 Pain Score: 10-Worst pain ever Pain Location: R knee  Pain Descriptors / Indicators: Aching;Sore;Operative site guarding Pain Intervention(s): Limited activity within patient's tolerance;Monitored during session;Repositioned    Home Living Family/patient expects to be discharged to:: Private residence Living Arrangements: Non-relatives/Friends Available Help at Discharge: Friend(s);Available 24 hours/day Type of Home: Mobile home Home Access: Stairs to enter Entrance Stairs-Rails: Right;Left;Can reach both Entrance Stairs-Number of Steps: 4 Home Layout: One level Home Equipment: Cane - single point      Prior Function Level of Independence: Independent  Hand Dominance   Dominant Hand: Right    Extremity/Trunk Assessment   Upper Extremity Assessment Upper Extremity Assessment: Defer to OT  evaluation    Lower Extremity Assessment Lower Extremity Assessment: RLE deficits/detail RLE Deficits / Details: Sensory in tact. Deficits consistent with post op pain and weakness. Limited tolerance to exercise secondary to pain.     Cervical / Trunk Assessment Cervical / Trunk Assessment: Normal  Communication   Communication: No difficulties  Cognition Arousal/Alertness: Awake/alert Behavior During Therapy: WFL for tasks assessed/performed Overall Cognitive Status: Within Functional Limits for tasks assessed                                        General Comments General comments (skin integrity, edema, etc.): Very limited tolerance for activity secondary to pain. Friend present in room throughout.     Exercises Total Joint Exercises Ankle Circles/Pumps: AROM;Both;10 reps;Supine Quad Sets: AROM;Right;10 reps;Supine Towel Squeeze: AROM;Both;10 reps;Supine   Assessment/Plan    PT Assessment Patient needs continued PT services  PT Problem List Decreased strength;Decreased range of motion;Decreased activity tolerance;Decreased balance;Decreased mobility;Decreased knowledge of use of DME;Decreased knowledge of precautions;Pain       PT Treatment Interventions DME instruction;Gait training;Stair training;Functional mobility training;Therapeutic activities;Therapeutic exercise;Balance training;Neuromuscular re-education;Patient/family education    PT Goals (Current goals can be found in the Care Plan section)  Acute Rehab PT Goals Patient Stated Goal: to decrease pain  PT Goal Formulation: With patient Time For Goal Achievement: 05/20/17 Potential to Achieve Goals: Good    Frequency 7X/week   Barriers to discharge        Co-evaluation               AM-PAC PT "6 Clicks" Daily Activity  Outcome Measure Difficulty turning over in bed (including adjusting bedclothes, sheets and blankets)?: A Lot Difficulty moving from lying on back to sitting on the  side of the bed? : Total Difficulty sitting down on and standing up from a chair with arms (e.g., wheelchair, bedside commode, etc,.)?: Total Help needed moving to and from a bed to chair (including a wheelchair)?: A Little Help needed walking in hospital room?: A Little Help needed climbing 3-5 steps with a railing? : A Lot 6 Click Score: 12    End of Session Equipment Utilized During Treatment: Gait belt Activity Tolerance: Patient limited by pain Patient left: in chair;with call bell/phone within reach;with family/visitor present Nurse Communication: Mobility status PT Visit Diagnosis: Other abnormalities of gait and mobility (R26.89);Pain Pain - Right/Left: Right Pain - part of body: Knee    Time: 1610-9604 PT Time Calculation (min) (ACUTE ONLY): 24 min   Charges:   PT Evaluation $PT Eval Low Complexity: 1 Procedure PT Treatments $Gait Training: 8-22 mins   PT G Codes:   PT G-Codes **NOT FOR INPATIENT CLASS** Functional Assessment Tool Used: AM-PAC 6 Clicks Basic Mobility;Clinical judgement Functional Limitation: Mobility: Walking and moving around Mobility: Walking and Moving Around Current Status ([id number]): At least 60 percent but less than 80 percent impaired, limited or restricted Mobility: Walking and Moving Around Goal Status [phone]): At least 1 percent but less than 20 percent impaired, limited or restricted    Gladys Damme, PT, DPT  Acute Rehabilitation Services  Pager: [phone]   Lehman Prom 05/13/2017, 6:35 PM

## 2017-05-13 NOTE — Transfer of Care (Signed)
Immediate Anesthesia Transfer of Care Note  Patient: [name]  Procedure(s) Performed: Procedure(s): RIGHT TOTAL KNEE ARTHROPLASTY (Right)  Patient Location: PACU  Anesthesia Type:Spinal  Level of Consciousness: drowsy and patient cooperative  Airway & Oxygen Therapy: Patient Spontanous Breathing and Patient connected to face mask oxygen  Post-op Assessment: Report given to RN and Post -op Vital signs reviewed and stable  Post vital signs: Reviewed and stable  Last Vitals:  Vitals:   05/13/17 0905 05/13/17 1214  BP: 134/73   Pulse: 65   Resp: (!) 21   Temp:  (!) 36.3 C    Last Pain:  Vitals:   05/13/17 1214  TempSrc: Tympanic  PainSc:       Patients Stated Pain Goal: 5 (05/13/17 0758)  Complications: No apparent anesthesia complications

## 2017-05-14 ENCOUNTER — Encounter (HOSPITAL_COMMUNITY): Payer: Self-pay | Admitting: Orthopedic Surgery

## 2017-05-14 DIAGNOSIS — Z79899 Other long term (current) drug therapy: Secondary | ICD-10-CM | POA: Diagnosis not present

## 2017-05-14 DIAGNOSIS — I428 Other cardiomyopathies: Secondary | ICD-10-CM | POA: Diagnosis present

## 2017-05-14 DIAGNOSIS — M1711 Unilateral primary osteoarthritis, right knee: Secondary | ICD-10-CM | POA: Diagnosis present

## 2017-05-14 DIAGNOSIS — I110 Hypertensive heart disease with heart failure: Secondary | ICD-10-CM | POA: Diagnosis present

## 2017-05-14 DIAGNOSIS — F419 Anxiety disorder, unspecified: Secondary | ICD-10-CM | POA: Diagnosis present

## 2017-05-14 DIAGNOSIS — I509 Heart failure, unspecified: Secondary | ICD-10-CM | POA: Diagnosis present

## 2017-05-14 DIAGNOSIS — F319 Bipolar disorder, unspecified: Secondary | ICD-10-CM | POA: Diagnosis present

## 2017-05-14 DIAGNOSIS — M25561 Pain in right knee: Secondary | ICD-10-CM | POA: Diagnosis not present

## 2017-05-14 LAB — CBC
HEMATOCRIT: 33.6 % — AB (ref 36.0–46.0)
Hemoglobin: 11.1 g/dL — ABNORMAL LOW (ref 12.0–15.0)
MCH: 28.6 pg (ref 26.0–34.0)
MCHC: 33 g/dL (ref 30.0–36.0)
MCV: 86.6 fL (ref 78.0–100.0)
PLATELETS: 260 10*3/uL (ref 150–400)
RBC: 3.88 MIL/uL (ref 3.87–5.11)
RDW: 14 % (ref 11.5–15.5)
WBC: 16.7 10*3/uL — AB (ref 4.0–10.5)

## 2017-05-14 LAB — BASIC METABOLIC PANEL
Anion gap: 7 (ref 5–15)
BUN: 18 mg/dL (ref 6–20)
CALCIUM: 8.5 mg/dL — AB (ref 8.9–10.3)
CO2: 23 mmol/L (ref 22–32)
CREATININE: 0.95 mg/dL (ref 0.44–1.00)
Chloride: 104 mmol/L (ref 101–111)
Glucose, Bld: 154 mg/dL — ABNORMAL HIGH (ref 65–99)
Potassium: 4.4 mmol/L (ref 3.5–5.1)
Sodium: 134 mmol/L — ABNORMAL LOW (ref 135–145)

## 2017-05-14 MED ORDER — TRAMADOL HCL 50 MG PO TABS: 50.0000 mg | ORAL_TABLET | Freq: Four times a day (QID) | ORAL | 0 refills | Status: DC

## 2017-05-14 MED ORDER — ASPIRIN 325 MG PO TBEC: 325.0000 mg | DELAYED_RELEASE_TABLET | Freq: Two times a day (BID) | ORAL | 0 refills | Status: DC

## 2017-05-14 MED ORDER — HYDROCODONE-ACETAMINOPHEN 10-325 MG PO TABS: 1.0000 | ORAL_TABLET | ORAL | 0 refills | Status: DC | PRN

## 2017-05-14 MED ORDER — METHOCARBAMOL 500 MG PO TABS: 500.0000 mg | ORAL_TABLET | Freq: Four times a day (QID) | ORAL | 0 refills | Status: DC | PRN

## 2017-05-14 NOTE — Care Management Obs Status (Signed)
MEDICARE OBSERVATION STATUS NOTIFICATION   Patient Details  Name: Cynthia Richardson MRN: [id number] Date of Birth: May 31, 1966   Medicare Observation Status Notification Given:  Yes    Durenda Guthrie, RN 05/14/2017, 10:40 AM

## 2017-05-14 NOTE — Progress Notes (Addendum)
Physical Therapy Treatment Patient Details Name: Cynthia Richardson MRN: [id number] DOB: 05-15-1966 Today's Date: 05/14/2017    History of Present Illness Pt is a 51 y/o female s/p elective R TKA. PMH includes bipolar disorder, depression, CHF, anxiety, HTN, and nonischemic cardiomyopathy with respiratory failure in 2011.     PT Comments    Patient tolerated slight increase in gait distance this session and required min A for safety. Pt continues to be limited by pain despite premedication. Continue to progress as tolerated. Patient needs to practice stairs next session.      Follow Up Recommendations  DC plan and follow up therapy as arranged by surgeon;Supervision/Assistance - 24 hour     Equipment Recommendations  Rolling walker with 5" wheels;3in1 (PT)    Recommendations for Other Services       Precautions / Restrictions Precautions Precautions: Knee Precaution Comments: precautions/positioning reviewed with pt Restrictions Weight Bearing Restrictions: Yes RLE Weight Bearing: Weight bearing as tolerated    Mobility  Bed Mobility Overal bed mobility: Modified Independent Bed Mobility: Supine to Sit           General bed mobility comments: increased time and effort; HOB flat  Transfers Overall transfer level: Needs assistance Equipment used: Rolling walker (2 wheeled) Transfers: Sit to/from Stand Sit to Stand: Min assist         General transfer comment: assist to power up into standing; cues for safe hand placement  Ambulation/Gait Ambulation/Gait assistance: Min assist Ambulation Distance (Feet): 40 Feet Assistive device: Rolling walker (2 wheeled) Gait Pattern/deviations: Step-to pattern;Decreased weight shift to right;Antalgic;Decreased stance time - right;Decreased step length - left Gait velocity: Decreased.  Gait velocity interpretation: Below normal speed for age/gender General Gait Details: verbal and tactile cues for R quad activation during stance  phase and for posture   Stairs            Wheelchair Mobility    Modified Rankin (Stroke Patients Only)       Balance Overall balance assessment: Needs assistance Sitting-balance support: No upper extremity supported;Feet supported Sitting balance-Leahy Scale: Good     Standing balance support: Bilateral upper extremity supported;During functional activity Standing balance-Leahy Scale: Poor Standing balance comment: Reliant on UE support on RW.                             Cognition Arousal/Alertness: Awake/alert Behavior During Therapy: WFL for tasks assessed/performed Overall Cognitive Status: Within Functional Limits for tasks assessed                                        Exercises Total Joint Exercises Ankle Circles/Pumps: AROM;Both;10 reps;Supine Quad Sets: AROM;Right;10 reps;Supine Heel Slides: AAROM;Right;10 reps;Supine Straight Leg Raises: AAROM;Right;5 reps;Supine    General Comments        Pertinent Vitals/Pain Pain Assessment: 0-10 Pain Score: 10-Worst pain ever Pain Location: R knee  Pain Descriptors / Indicators: Aching;Sore;Grimacing;Guarding;Moaning Pain Intervention(s): Limited activity within patient's tolerance;Monitored during session;Premedicated before session;Repositioned;Patient requesting pain meds-RN notified    Home Living                      Prior Function            PT Goals (current goals can now be found in the care plan section) Acute Rehab PT Goals Patient Stated Goal: to decrease pain  PT Goal Formulation: With patient Time For Goal Achievement: 05/20/17 Potential to Achieve Goals: Good Progress towards PT goals: Progressing toward goals    Frequency    7X/week      PT Plan Current plan remains appropriate    Co-evaluation              AM-PAC PT "6 Clicks" Daily Activity  Outcome Measure  Difficulty turning over in bed (including adjusting bedclothes, sheets  and blankets)?: A Lot Difficulty moving from lying on back to sitting on the side of the bed? : Total Difficulty sitting down on and standing up from a chair with arms (e.g., wheelchair, bedside commode, etc,.)?: Total Help needed moving to and from a bed to chair (including a wheelchair)?: A Little Help needed walking in hospital room?: A Little Help needed climbing 3-5 steps with a railing? : A Lot 6 Click Score: 12    End of Session Equipment Utilized During Treatment: Gait belt Activity Tolerance: Patient limited by pain Patient left: in chair;with call bell/phone within reach;with family/visitor present Nurse Communication: Mobility status PT Visit Diagnosis: Other abnormalities of gait and mobility (R26.89);Pain Pain - Right/Left: Right Pain - part of body: Knee     Time: 7858-8502 PT Time Calculation (min) (ACUTE ONLY): 36 min  Charges:  $Gait Training: 8-22 mins $Therapeutic Exercise: 8-22 mins                    G Codes:       Erline Levine, PTA Pager: [phone]     Carolynne Edouard 05/14/2017, 9:25 AM

## 2017-05-14 NOTE — Op Note (Signed)
TOTAL KNEE REPLACEMENT OPERATIVE NOTE:  05/13/2017  11:33 AM  PATIENT:  Cynthia Richardson  51 y.o. female  PRE-OPERATIVE DIAGNOSIS:  primary osteoarthritis right knee  POST-OPERATIVE DIAGNOSIS:  primary osteoarthritis right knee  PROCEDURE:  Procedure(s): RIGHT TOTAL KNEE ARTHROPLASTY  SURGEON:  Surgeon(s): Dannielle Huh, MD  PHYSICIAN ASSISTANT: Laurier Nancy, [facility]   ANESTHESIA:   spinal  DRAINS: Hemovac  SPECIMEN: None  COUNTS:  Correct  TOURNIQUET:   Total Tourniquet Time Documented: Thigh (Right) - 44 minutes Total: Thigh (Right) - 44 minutes   DICTATION:  Indication for procedure:    The patient is a 51 y.o. female who has failed conservative treatment for primary osteoarthritis right knee.  Informed consent was obtained prior to anesthesia. The risks versus benefits of the operation were explain and in a way the patient can, and did, understand.   On the implant demand matching protocol, this patient scored 10.  Therefore, this patient was not receive a polyethylene insert with vitamin E which is a high demand implant.  Description of procedure:     The patient was taken to the operating room and placed under anesthesia.  The patient was positioned in the usual fashion taking care that all body parts were adequately padded and/or protected.  I foley catheter was not placed.  A tourniquet was applied and the leg prepped and draped in the usual sterile fashion.  The extremity was exsanguinated with the esmarch and tourniquet inflated to 350 mmHg.  Pre-operative range of motion was normal.  The knee was in 5 degree of mild varus.  A midline incision approximately 6-7 inches long was made with a #10 blade.  A new blade was used to make a parapatellar arthrotomy going 2-3 cm into the quadriceps tendon, over the patella, and alongside the medial aspect of the patellar tendon.  A synovectomy was then performed with the #10 blade and forceps. I then elevated the deep MCL off the  medial tibial metaphysis subperiosteally around to the semimembranosus attachment.    I everted the patella and used calipers to measure patellar thickness.  I used the reamer to ream down to appropriate thickness to recreate the native thickness.  I then removed excess bone with the rongeur and sagittal saw.  I used the appropriately sized template and drilled the three lug holes.  I then put the trial in place and measured the thickness with the calipers to ensure recreation of the native thickness.  The trial was then removed and the patella subluxed and the knee brought into flexion.  A homan retractor was place to retract and protect the patella and lateral structures.  A Z-retractor was place medially to protect the medial structures.  The extra-medullary alignment system was used to make cut the tibial articular surface perpendicular to the anamotic axis of the tibia and in 3 degrees of posterior slope.  The cut surface and alignment jig was removed.  I then used the intramedullary alignment guide to make a 6 valgus cut on the distal femur.  I then marked out the epicondylar axis on the distal femur.  The posterior condylar axis measured 3 degrees.  I then used the anterior referencing sizer and measured the femur to be a size 5.  The 4-In-1 cutting block was screwed into place in external rotation matching the posterior condylar angle, making our cuts perpendicular to the epicondylar axis.  Anterior, posterior and chamfer cuts were made with the sagittal saw.  The cutting block and cut  pieces were removed.  A lamina spreader was placed in 90 degrees of flexion.  The ACL, PCL, menisci, and posterior condylar osteophytes were removed.  A 16 mm spacer blocked was found to offer good flexion and extension gap balance after mild in degree releasing.   The scoop retractor was then placed and the femoral finishing block was pinned in place.  The small sagittal saw was used as well as the lug drill to finish  the femur.  The block and cut surfaces were removed and the medullary canal hole filled with autograft bone from the cut pieces.  The tibia was delivered forward in deep flexion and external rotation.  A size C tray was selected and pinned into place centered on the medial 1/3 of the tibial tubercle.  The reamer and keel was used to prepare the tibia through the tray.    I then trialed with the size 5 femur, size C tibia, a 16 mm insert and the 32 patella.  I had excellent flexion/extension gap balance, excellent patella tracking.  Flexion was full and beyond 120 degrees; extension was zero.  These components were chosen and the staff opened them to me on the back table while the knee was lavaged copiously and the cement mixed.  The soft tissue was infiltrated with 60cc of exparel 1.3% through a 21 gauge needle.  I cemented in the components and removed all excess cement.  The polyethylene tibial component was snapped into place and the knee placed in extension while cement was hardening.  The capsule was infilltrated with 30cc of .25% Marcaine with epinephrine.  A hemovac was place in the joint exiting superolaterally.  A pain pump was place superomedially superficial to the arthrotomy.  Once the cement was hard, the tourniquet was let down.  Hemostasis was obtained.  The arthrotomy was closed with figure-8 #1 vicryl sutures.  The deep soft tissues were closed with #0 vicryls and the subcuticular layer closed with a running #2-0 vicryl.  The skin was reapproximated and closed with skin staples.  The wound was dressed with xeroform, 4 x4's, 2 ABD sponges, a single layer of webril and a TED stocking.   The patient was then awakened, extubated, and taken to the recovery room in stable condition.  BLOOD LOSS:  300cc DRAINS: 1 hemovac, 1 pain catheter COMPLICATIONS:  None.  PLAN OF CARE: Admit to inpatient   PATIENT DISPOSITION:  PACU - hemodynamically stable.   Delay start of Pharmacological VTE agent  (>24hrs) due to surgical blood loss or risk of bleeding:  not applicable  Please fax a copy of this op note to my office at [phone] (please only include page 1 and 2 of the Case Information op note)

## 2017-05-14 NOTE — Evaluation (Signed)
Occupational Therapy Evaluation Patient Details Name: Cynthia Richardson MRN: [id number] DOB: 1966/10/15 Today's Date: 05/14/2017    History of Present Illness Pt is a 51 y/o female s/p elective R TKA. PMH includes bipolar disorder, depression, CHF, anxiety, HTN, and nonischemic cardiomyopathy with respiratory failure in 2011.    Clinical Impression   Patient evaluated by Occupational Therapy with no further acute OT needs identified. All education has been completed and the patient has no further questions. See below for any follow-up Occupational Therapy or equipment needs. OT to sign off. Thank you for referral.      Follow Up Recommendations  No OT follow up    Equipment Recommendations  3 in 1 bedside commode    Recommendations for Other Services       Precautions / Restrictions Precautions Precautions: Knee Precaution Comments: precautions/positioning reviewed with pt Restrictions Weight Bearing Restrictions: Yes RLE Weight Bearing: Weight bearing as tolerated      Mobility Bed Mobility Overal bed mobility: Modified Independent Bed Mobility: Supine to Sit           General bed mobility comments: bed elevated to simulate home and able to fully complete transfer to bed without (A)   Transfers Overall transfer level: Needs assistance Equipment used: Rolling walker (2 wheeled) Transfers: Sit to/from Stand Sit to Stand: Min guard         General transfer comment: pt did complete 1 (A) min (A) due to poor R LE placement and calling out in pain and falling back into chair. pt educated on R LE extended and able to complete transfer with decr (A) x3 this session    Balance Overall balance assessment: Needs assistance Sitting-balance support: No upper extremity supported;Feet supported Sitting balance-Leahy Scale: Good     Standing balance support: Bilateral upper extremity supported;During functional activity Standing balance-Leahy Scale: Poor Standing balance  comment: Reliant on UE support on RW.                            ADL either performed or assessed with clinical judgement   ADL Overall ADL's : Needs assistance/impaired Eating/Feeding: Independent   Grooming: Independent   Upper Body Bathing: Independent   Lower Body Bathing: Min guard       Lower Body Dressing: Min guard Lower Body Dressing Details (indicate cue type and reason): pt able to push up from chair and thread underwear over bil LE Toilet Transfer: Min guard         Tub/Shower Transfer Details (indicate cue type and reason): educated on transfer and handed a handout as reference for home Functional mobility during ADLs: Min guard;Rolling walker General ADL Comments: Pt educated on leg lifter and use of L LE to (A) R Le. pt able to complete bed transfer with L LE (A)    Pt educated on bathing and avoid washing directly on incision. Pt educated to use new wash cloth and towel each day. Pt educated to allow water to run across dressing and not to soak in a tub at this time. Pt advised RN will instruct on any bandages required otherwise is open to air.   Pt provided education handout for 3n1 tub transfer  Vision Baseline Vision/History: No visual deficits       Perception     Praxis      Pertinent Vitals/Pain Pain Assessment: 0-10 Pain Score: 7  Pain Location: R knee Pain Descriptors / Indicators: Operative site guarding;Headache;Grimacing;Discomfort Pain Intervention(s):  Limited activity within patient's tolerance;Monitored during session;Premedicated before session;Repositioned;Ice applied     Hand Dominance Right   Extremity/Trunk Assessment Upper Extremity Assessment Upper Extremity Assessment: Overall WFL for tasks assessed   Lower Extremity Assessment Lower Extremity Assessment: Defer to PT evaluation   Cervical / Trunk Assessment Cervical / Trunk Assessment: Normal   Communication Communication Communication: No difficulties    Cognition Arousal/Alertness: Awake/alert Behavior During Therapy: WFL for tasks assessed/performed Overall Cognitive Status: Within Functional Limits for tasks assessed                                     General Comments  pt educated on bathing / dressing R LE. pt educated on R LE wound care to decr risk of infection    Exercises Exercises: Total Joint Total Joint Exercises Ankle Circles/Pumps: AROM;10 reps;Seated Quad Sets: AROM;Right;10 reps;Supine Heel Slides: AAROM;Right;10 reps;Supine Straight Leg Raises: AAROM;Right;5 reps;Supine   Shoulder Instructions      Home Living Family/patient expects to be discharged to:: Private residence Living Arrangements: Non-relatives/Friends Available Help at Discharge: Friend(s);Available 24 hours/day Type of Home: Mobile home Home Access: Stairs to enter Entrance Stairs-Number of Steps: 4 Entrance Stairs-Rails: Right;Left;Can reach both Home Layout: One level     Bathroom Shower/Tub: Chief Strategy Officer: Handicapped height     Home Equipment: Cane - single point   Additional Comments: will have roommate and son ( which is not really her son ) (A) upon d/c      Prior Functioning/Environment Level of Independence: Independent                 OT Problem List:        OT Treatment/Interventions:      OT Goals(Current goals can be found in the care plan section) Acute Rehab OT Goals Patient Stated Goal: to decr pain reports pain has not reached below a "5 out 10"  OT Frequency:     Barriers to D/C:            Co-evaluation              AM-PAC PT "6 Clicks" Daily Activity     Outcome Measure Help from another person eating meals?: None Help from another person taking care of personal grooming?: None Help from another person toileting, which includes using toliet, bedpan, or urinal?: None Help from another person bathing (including washing, rinsing, drying)?: None Help from  another person to put on and taking off regular upper body clothing?: None Help from another person to put on and taking off regular lower body clothing?: None 6 Click Score: 24   End of Session Equipment Utilized During Treatment: Gait belt;Rolling walker CPM Right Knee CPM Right Knee: Off Nurse Communication: Mobility status;Precautions  Activity Tolerance: Patient tolerated treatment well Patient left: in bed;with call bell/phone within reach;with family/visitor present  OT Visit Diagnosis: Unsteadiness on feet (R26.81)                Time: 1010-1033 OT Time Calculation (min): 23 min Charges:  OT General Charges $OT Visit: 1 Procedure OT Evaluation $OT Eval Moderate Complexity: 1 Procedure OT Treatments $Self Care/Home Management : 8-22 mins G-Codes: OT G-codes **NOT FOR INPATIENT CLASS** Functional Assessment Tool Used: Clinical judgement Functional Limitation: Self care Self Care Current Status (U0454): 0 percent impaired, limited or restricted Self Care Goal Status (U9811): 0 percent impaired, limited or restricted Self  Care Discharge Status [phone]): 0 percent impaired, limited or restricted    Mateo Flow   OTR/L Pager: [phone] Office: [phone] .   Boone Master B 05/14/2017, 11:06 AM

## 2017-05-14 NOTE — Anesthesia Postprocedure Evaluation (Signed)
Anesthesia Post Note  Patient: Cynthia Richardson  Procedure(s) Performed: Procedure(s) (LRB): RIGHT TOTAL KNEE ARTHROPLASTY (Right)     Patient location during evaluation: PACU Anesthesia Type: Spinal and Regional Level of consciousness: patient cooperative, oriented and awake and alert Vital Signs Assessment: post-procedure vital signs reviewed and stable Respiratory status: spontaneous breathing, nonlabored ventilation, respiratory function stable and patient connected to nasal cannula oxygen Cardiovascular status: blood pressure returned to baseline and stable Postop Assessment: spinal receding and no signs of nausea or vomiting Anesthetic complications: no Comments: Delayed note, pt eval in PACU, very comfortable     Last Vitals:  Vitals:   05/14/17 0100 05/14/17 0600  BP: 109/63 111/63  Pulse: 83 76  Resp: 16 16  Temp: 36.9 C 37 C    Last Pain:  Vitals:   05/14/17 0900  TempSrc:   PainSc: 8                  Diva Lemberger,E. Mackenzey [place]

## 2017-05-14 NOTE — Care Management Note (Signed)
Case Management Note  Patient Details  Name: Cynthia Richardson MRN: [id number] Date of Birth: [birth year]  Subjective/Objective:  51 yr old female s/p right total knee arthroplasty.                  Action/Plan: Case manager spoke with patient concerning Home health and DME needs. Patient was preoperatively setup with [facility], no changes. She will have family support at discharge. DME to be delivered by [employer].    Expected Discharge Date:   05/14/17               Expected Discharge Plan:  Home w Home Health Services  In-House Referral:  NA  Discharge planning Services  CM Consult  Post Acute Care Choice:  Durable Medical Equipment, Home Health Choice offered to:  Patient  DME Arranged:  3-N-1, CPM, Walker rolling DME Agency:  [employer]  HH Arranged:  PT HH Agency:  [facility]  Status of Service:  Completed, signed off  If discussed at [employer] [facility] [employer], dates discussed:    Additional Comments:  Durenda Guthrie, RN 05/14/2017, 11:22 AM

## 2017-05-14 NOTE — Progress Notes (Signed)
Physical Therapy Treatment Patient Details Name: Cynthia Richardson MRN: [id number] DOB: [birth year] Today's Date: 05/14/2017    History of Present Illness Pt is a 51 y/o female s/p elective R TKA. PMH includes bipolar disorder, depression, CHF, anxiety, HTN, and nonischemic cardiomyopathy with respiratory failure in 2011.     PT Comments    Patient continues to make gradual progress toward mobility goals. Pt able to negotiate steps with min A and able to tolerate slight increase in gait distance however pt continues to be limited by pain. Pt trembling and diaphoretic with mobility. Continue to progress as tolerated.    Follow Up Recommendations  DC plan and follow up therapy as arranged by surgeon;Supervision/Assistance - 24 hour     Equipment Recommendations  Rolling walker with 5" wheels;3in1 (PT)    Recommendations for Other Services       Precautions / Restrictions Precautions Precautions: Knee Precaution Comments: precautions/positioning reviewed with pt Restrictions Weight Bearing Restrictions: Yes RLE Weight Bearing: Weight bearing as tolerated    Mobility  Bed Mobility Overal bed mobility: Modified Independent             General bed mobility comments: bed elevated to simulate home and able to fully complete transfer to bed without (A)   Transfers Overall transfer level: Needs assistance Equipment used: Rolling walker (2 wheeled) Transfers: Sit to/from Stand Sit to Stand: Min guard         General transfer comment: min guard for safety; carry over of safe hand placement and technique  Ambulation/Gait Ambulation/Gait assistance: Min assist;Min guard Ambulation Distance (Feet):  (pt unable to ambulate farther than 44ft at a time) Assistive device: Rolling walker (2 wheeled) Gait Pattern/deviations: Step-to pattern;Decreased weight shift to right;Antalgic;Decreased stance time - right;Decreased step length - left Gait velocity: Decreased.    General Gait  Details: cues for posture, relaxing shoulders, and R heel strike   Stairs Stairs: Yes   Stair Management: One rail Right;Step to pattern;Sideways Number of Stairs:  (2 steps X2) General stair comments: cues for sequencing and technique; assist to steady  Wheelchair Mobility    Modified Rankin (Stroke Patients Only)       Balance Overall balance assessment: Needs assistance Sitting-balance support: No upper extremity supported;Feet supported Sitting balance-Leahy Scale: Good     Standing balance support: Bilateral upper extremity supported;During functional activity Standing balance-Leahy Scale: Poor Standing balance comment: Reliant on UE support on RW.                             Cognition Arousal/Alertness: Awake/alert Behavior During Therapy: WFL for tasks assessed/performed Overall Cognitive Status: Within Functional Limits for tasks assessed                                        Exercises Total Joint Exercises Ankle Circles/Pumps: AROM;10 reps;Seated    General Comments General comments (skin integrity, edema, etc.): pt educated on bathing / dressing R LE. pt educated on R LE wound care to decr risk of infection      Pertinent Vitals/Pain Pain Assessment: Faces Pain Score: 7  Faces Pain Scale: Hurts whole lot Pain Location: R knee  Pain Descriptors / Indicators: Sore;Grimacing;Guarding;Moaning Pain Intervention(s): Limited activity within patient's tolerance;Monitored during session;Premedicated before session;Repositioned;Patient requesting pain meds-RN notified;Ice applied    Home Living Family/patient expects to be discharged to:: Private residence  Living Arrangements: Non-relatives/Friends Available Help at Discharge: Friend(s);Available 24 hours/day Type of Home: Mobile home Home Access: Stairs to enter Entrance Stairs-Rails: Right;Left;Can reach both Home Layout: One level Home Equipment: Cane - single point Additional  Comments: will have roommate and son ( which is not really her son ) (A) upon d/c    Prior Function Level of Independence: Independent          PT Goals (current goals can now be found in the care plan section) Acute Rehab PT Goals Patient Stated Goal: to decrease pain  PT Goal Formulation: With patient Time For Goal Achievement: 05/20/17 Potential to Achieve Goals: Good Progress towards PT goals: Progressing toward goals    Frequency    7X/week      PT Plan Current plan remains appropriate    Co-evaluation              AM-PAC PT "6 Clicks" Daily Activity  Outcome Measure  Difficulty turning over in bed (including adjusting bedclothes, sheets and blankets)?: A Lot Difficulty moving from lying on back to sitting on the side of the bed? : A Lot Difficulty sitting down on and standing up from a chair with arms (e.g., wheelchair, bedside commode, etc,.)?: A Lot Help needed moving to and from a bed to chair (including a wheelchair)?: A Little Help needed walking in hospital room?: A Little Help needed climbing 3-5 steps with a railing? : A Little 6 Click Score: 15    End of Session Equipment Utilized During Treatment: Gait belt Activity Tolerance: Patient limited by pain Patient left: in chair;with call bell/phone within reach;with family/visitor present Nurse Communication: Mobility status PT Visit Diagnosis: Other abnormalities of gait and mobility (R26.89);Pain Pain - Right/Left: Right Pain - part of body: Knee     Time: 0488-8916 PT Time Calculation (min) (ACUTE ONLY): 22 min  Charges:  $Gait Training: 8-22 mins                    G Codes:       Erline Levine, PTA Pager: [phone]     Carolynne Edouard 05/14/2017, 2:57 PM

## 2017-05-14 NOTE — Progress Notes (Signed)
SPORTS MEDICINE AND JOINT REPLACEMENT  Georgena Spurling, MD    Laurier Nancy, PA-C [address] [place], [place], [place]  [address]                             [phone]   PROGRESS NOTE  Subjective:  negative for Chest Pain  negative for Shortness of Breath  negative for Nausea/Vomiting   negative for Calf Pain  negative for Bowel Movement   Tolerating Diet: yes         Patient reports pain as 5 on 0-10 scale.    Objective: Vital signs in last 24 hours:   Patient Vitals for the past 24 hrs:  BP Temp Temp src Pulse Resp SpO2 Weight  05/14/17 0600 111/63 98.6 F (37 C) Axillary 76 16 95 % -  05/14/17 0100 109/63 98.4 F (36.9 C) Oral 83 16 93 % -  05/13/17 2052 (!) 106/54 98.6 F (37 C) Oral 67 15 93 % -  05/13/17 1529 135/79 97.7 F (36.5 C) Oral 70 14 97 % -  05/13/17 1445 - - - (!) 58 12 97 % -  05/13/17 1415 - - - (!) 56 14 100 % -  05/13/17 1400 126/75 - - (!) 55 15 100 % -  05/13/17 1345 126/75 - - (!) 56 13 100 % -  05/13/17 1330 122/78 - - (!) 55 14 99 % -  05/13/17 1315 126/74 - - (!) 56 12 100 % -  05/13/17 1300 125/79 - - (!) 56 12 100 % -  05/13/17 1245 116/74 - - 62 13 100 % -  05/13/17 1230 108/70 - - 61 13 100 % -  05/13/17 1215 113/60 (!) 97.4 F (36.3 C) - 67 12 100 % -  05/13/17 1214 - (!) 97.4 F (36.3 C) Tympanic - - - -  05/13/17 0905 134/73 - - 65 (!) 21 94 % -  05/13/17 0900 140/78 - - 70 15 100 % -  05/13/17 0855 (!) 178/92 - - 74 20 100 % -  05/13/17 0759 - - - - - - 115.8 kg (255 lb 6.4 oz)  05/13/17 0726 (!) 158/92 98.1 F (36.7 C) Oral 76 18 100 % -    @flow {1959:LAST@   Intake/Output from previous day:   07/23 0701 - 07/24 0700 In: 1200 [P.O.:240; I.V.:800] Out: 820 [Urine:800]   Intake/Output this shift:   No intake/output data recorded.   Intake/Output      07/23 0701 - 07/24 0700 07/24 0701 - 07/25 0700   P.O. 240    I.V. (mL/kg) 800 (6.9)    IV Piggyback 160    Total Intake(mL/kg) 1200 (10.4)    Urine (mL/kg/hr) 800    Blood 20    Total Output 820     Net +380          Urine Occurrence 2 x       LABORATORY DATA:  Recent Labs  05/14/17 0352  WBC 16.7*  HGB 11.1*  HCT 33.6*  PLT 260    Recent Labs  05/14/17 0352  NA 134*  K 4.4  CL 104  CO2 23  BUN 18  CREATININE 0.95  GLUCOSE 154*  CALCIUM 8.5*   Lab Results  Component Value Date   INR 1.08 12/30/2009    Examination:  General appearance: alert, cooperative and no distress Extremities: extremities normal, atraumatic, no cyanosis or edema  Wound Exam: clean, dry,  intact   Drainage:  None: wound tissue dry  Motor Exam: Quadriceps and Hamstrings Intact  Sensory Exam: Superficial Peroneal, Deep Peroneal and Tibial normal   Assessment:    1 Day Post-Op  Procedure(s) (LRB): RIGHT TOTAL KNEE ARTHROPLASTY (Right)  ADDITIONAL DIAGNOSIS:  Active Problems:   S/P total knee replacement     Plan: Physical Therapy as ordered Weight Bearing as Tolerated (WBAT)  DVT Prophylaxis:  Aspirin  DISCHARGE PLAN: Home  DISCHARGE NEEDS: HHPT   Patient doing well but is struggling with pain control. Will continue to follow today to see progression with PT and pain control to determine D/C statues, today vs tomorrow         Guy Sandifer 05/14/2017, 7:06 AM

## 2017-05-15 LAB — CBC
HCT: 33.9 % — ABNORMAL LOW (ref 36.0–46.0)
Hemoglobin: 11.2 g/dL — ABNORMAL LOW (ref 12.0–15.0)
MCH: 28.9 pg (ref 26.0–34.0)
MCHC: 33 g/dL (ref 30.0–36.0)
MCV: 87.6 fL (ref 78.0–100.0)
PLATELETS: 268 10*3/uL (ref 150–400)
RBC: 3.87 MIL/uL (ref 3.87–5.11)
RDW: 14.5 % (ref 11.5–15.5)
WBC: 16.4 10*3/uL — ABNORMAL HIGH (ref 4.0–10.5)

## 2017-05-15 NOTE — Progress Notes (Signed)
Physical Therapy Treatment Patient Details Name: Cynthia Richardson MRN: [id number] DOB: 1966/01/02 Today's Date: 05/15/2017    History of Present Illness Pt is a 51 y/o female s/p elective R TKA. PMH includes bipolar disorder, depression, CHF, anxiety, HTN, and nonischemic cardiomyopathy with respiratory failure in 2011.     PT Comments    Patient tolerated R LE therex well. Pt demonstrated much improved quad activation and eccentric control of R LE. Current plan remains appropriate.    Follow Up Recommendations  DC plan and follow up therapy as arranged by surgeon;Supervision/Assistance - 24 hour     Equipment Recommendations  Rolling walker with 5" wheels;3in1 (PT)    Recommendations for Other Services       Precautions / Restrictions Precautions Precautions: Knee Precaution Comments: precautions/positioning reviewed with pt Restrictions Weight Bearing Restrictions: Yes RLE Weight Bearing: Weight bearing as tolerated    Mobility  Bed Mobility Overal bed mobility: Modified Independent             General bed mobility comments: increased effort  Transfers                    Ambulation/Gait                 Stairs            Wheelchair Mobility    Modified Rankin (Stroke Patients Only)       Balance   Sitting-balance support: No upper extremity supported;Feet supported Sitting balance-Leahy Scale: Good                                      Cognition Arousal/Alertness: Lethargic;Suspect due to medications Behavior During Therapy: [facility] for tasks assessed/performed Overall Cognitive Status: Within Functional Limits for tasks assessed                                 General Comments: pt falling asleep throughout session      Exercises Total Joint Exercises Quad Sets: AROM;Right;10 reps;Supine Short Arc Quad: AROM;Right;10 reps Heel Slides: AROM;Right;10 reps Hip ABduction/ADduction: AROM;Right;10  reps Straight Leg Raises: AROM;Right;10 reps Long Arc Quad: AROM;Right;10 reps Knee Flexion: AROM;Right;5 reps;Seated (10 second holds)    General Comments        Pertinent Vitals/Pain Pain Assessment: Faces Faces Pain Scale: Hurts even more Pain Location: R knee with therex Pain Descriptors / Indicators: Sore;Grimacing;Guarding;Moaning Pain Intervention(s): Monitored during session;Premedicated before session;Repositioned;Ice applied    Home Living                      Prior Function            PT Goals (current goals can now be found in the care plan section) Acute Rehab PT Goals Patient Stated Goal: to decrease pain  PT Goal Formulation: With patient Time For Goal Achievement: 05/20/17 Potential to Achieve Goals: Good Progress towards PT goals: Progressing toward goals    Frequency    7X/week      PT Plan Current plan remains appropriate    Co-evaluation              AM-PAC PT "6 Clicks" Daily Activity  Outcome Measure  Difficulty turning over in bed (including adjusting bedclothes, sheets and blankets)?: A Little Difficulty moving from lying on back to sitting on the side of  the bed? : A Little Difficulty sitting down on and standing up from a chair with arms (e.g., wheelchair, bedside commode, etc,.)?: A Lot Help needed moving to and from a bed to chair (including a wheelchair)?: A Little Help needed walking in hospital room?: A Little Help needed climbing 3-5 steps with a railing? : A Little 6 Click Score: 17    End of Session   Activity Tolerance: Patient tolerated treatment well;Patient limited by lethargy Patient left: with call bell/phone within reach;with family/visitor present;in bed Nurse Communication: Mobility status PT Visit Diagnosis: Other abnormalities of gait and mobility (R26.89);Pain Pain - Right/Left: Right Pain - part of body: Knee     Time: 8295-6213 PT Time Calculation (min) (ACUTE ONLY): 31 min  Charges:   $Therapeutic Exercise: 23-37 mins                    G Codes:       Erline Levine, PTA Pager: [phone]     Carolynne Edouard 05/15/2017, 2:21 PM

## 2017-05-15 NOTE — Progress Notes (Signed)
Nurse gave Pt. Pain med. And put on bone foam.

## 2017-05-15 NOTE — Progress Notes (Signed)
Physical Therapy Treatment Patient Details Name: Cynthia Richardson MRN: [id number] DOB: 1966-07-31 Today's Date: 05/15/2017    History of Present Illness Pt is a 51 y/o female s/p elective R TKA. PMH includes bipolar disorder, depression, CHF, anxiety, HTN, and nonischemic cardiomyopathy with respiratory failure in 2011.     PT Comments    Patient continues to progress toward mobility goals. Pt tolerated increased gait distance with improved gait mechanics and increase R LE weightbearing. Practiced stairs again this am and pt able to safely negotiate with min guard assist. Current plan remains appropriate.    Follow Up Recommendations  DC plan and follow up therapy as arranged by surgeon;Supervision/Assistance - 24 hour     Equipment Recommendations  Rolling walker with 5" wheels;3in1 (PT)    Recommendations for Other Services       Precautions / Restrictions Precautions Precautions: Knee Precaution Comments: precautions/positioning reviewed with pt Restrictions Weight Bearing Restrictions: Yes RLE Weight Bearing: Weight bearing as tolerated    Mobility  Bed Mobility Overal bed mobility: Modified Independent             General bed mobility comments: increased effort  Transfers Overall transfer level: Needs assistance Equipment used: Rolling walker (2 wheeled) Transfers: Sit to/from Stand Sit to Stand: Supervision         General transfer comment: carry over of safe hand placement and technique; supervision for safety  Ambulation/Gait Ambulation/Gait assistance: Min guard Ambulation Distance (Feet): 100 Feet Assistive device: Rolling walker (2 wheeled) Gait Pattern/deviations: Step-to pattern;Decreased weight shift to right;Antalgic;Decreased stance time - right;Decreased step length - left Gait velocity: Decreased.    General Gait Details: cues for posture and R heel strike; pt with improved step length symmetry and L foot clearance with increased  distance   Stairs     Stair Management: One rail Right;Step to pattern;Sideways Number of Stairs:  (2 steps X2) General stair comments: carry over of sequencing and technique; min guard for safety  Wheelchair Mobility    Modified Rankin (Stroke Patients Only)       Balance Overall balance assessment: Needs assistance Sitting-balance support: No upper extremity supported;Feet supported Sitting balance-Leahy Scale: Good     Standing balance support: Bilateral upper extremity supported;During functional activity Standing balance-Leahy Scale: Poor Standing balance comment: Reliant on UE support on RW.                             Cognition Arousal/Alertness: Awake/alert Behavior During Therapy: WFL for tasks assessed/performed Overall Cognitive Status: Within Functional Limits for tasks assessed                                        Exercises Total Joint Exercises Goniometric ROM: approx 85 degree flexion in sitting     General Comments        Pertinent Vitals/Pain Pain Assessment: Faces Faces Pain Scale: Hurts little more Pain Location: R knee  Pain Descriptors / Indicators: Sore;Grimacing;Guarding;Moaning Pain Intervention(s): Limited activity within patient's tolerance;Monitored during session;Premedicated before session;Repositioned;Ice applied    Home Living                      Prior Function            PT Goals (current goals can now be found in the care plan section) Acute Rehab PT Goals Patient Stated  Goal: to decrease pain  PT Goal Formulation: With patient Time For Goal Achievement: 05/20/17 Potential to Achieve Goals: Good Progress towards PT goals: Progressing toward goals    Frequency    7X/week      PT Plan Current plan remains appropriate    Co-evaluation              AM-PAC PT "6 Clicks" Daily Activity  Outcome Measure  Difficulty turning over in bed (including adjusting bedclothes,  sheets and blankets)?: A Little Difficulty moving from lying on back to sitting on the side of the bed? : A Little Difficulty sitting down on and standing up from a chair with arms (e.g., wheelchair, bedside commode, etc,.)?: A Lot Help needed moving to and from a bed to chair (including a wheelchair)?: A Little Help needed walking in hospital room?: A Little Help needed climbing 3-5 steps with a railing? : A Little 6 Click Score: 17    End of Session Equipment Utilized During Treatment: Gait belt Activity Tolerance: Patient limited by pain Patient left: in chair;with call bell/phone within reach;with family/visitor present Nurse Communication: Mobility status PT Visit Diagnosis: Other abnormalities of gait and mobility (R26.89);Pain Pain - Right/Left: Right Pain - part of body: Knee     Time: 4540-9811 PT Time Calculation (min) (ACUTE ONLY): 27 min  Charges:  $Gait Training: 23-37 mins                    G Codes:       Erline Levine, PTA Pager: [phone]     Carolynne Edouard 05/15/2017, 9:15 AM

## 2017-05-16 LAB — CBC
HEMATOCRIT: 34.9 % — AB (ref 36.0–46.0)
HEMOGLOBIN: 11.3 g/dL — AB (ref 12.0–15.0)
MCH: 29.2 pg (ref 26.0–34.0)
MCHC: 32.4 g/dL (ref 30.0–36.0)
MCV: 90.2 fL (ref 78.0–100.0)
Platelets: 260 10*3/uL (ref 150–400)
RBC: 3.87 MIL/uL (ref 3.87–5.11)
RDW: 15 % (ref 11.5–15.5)
WBC: 12.3 10*3/uL — ABNORMAL HIGH (ref 4.0–10.5)

## 2017-05-16 NOTE — Progress Notes (Signed)
Patient ready for discharge to home. Family to accompany. All personal belongings with pt. NO distress, no c/o noted. Home health to follow at home. Instructions rivewed for physician follow up.

## 2017-05-16 NOTE — Progress Notes (Signed)
Physical Therapy Treatment Patient Details Name: Cynthia Richardson MRN: [id number] DOB: 1966-04-07 Today's Date: 05/16/2017    History of Present Illness Pt is a 51 y/o female s/p elective R TKA. PMH includes bipolar disorder, depression, CHF, anxiety, HTN, and nonischemic cardiomyopathy with respiratory failure in 2011.     PT Comments    Patient continues to progress with mobility and with decreased pain today. Pt demonstrated improved gait mechanics and ROM and overall mod I/supervision for mobility. Current plan remains appropriate.    Follow Up Recommendations  DC plan and follow up therapy as arranged by surgeon;Supervision/Assistance - 24 hour     Equipment Recommendations  Rolling walker with 5" wheels;3in1 (PT)    Recommendations for Other Services       Precautions / Restrictions Precautions Precautions: Knee Precaution Comments: precautions/positioning reviewed with pt Restrictions Weight Bearing Restrictions: Yes RLE Weight Bearing: Weight bearing as tolerated    Mobility  Bed Mobility Overal bed mobility: Modified Independent             General bed mobility comments: increased effort  Transfers Overall transfer level: Needs assistance Equipment used: Rolling walker (2 wheeled) Transfers: Sit to/from Stand Sit to Stand: Supervision         General transfer comment: supervision for safety  Ambulation/Gait Ambulation/Gait assistance: Supervision Ambulation Distance (Feet): 150 Feet Assistive device: Rolling walker (2 wheeled) Gait Pattern/deviations: Step-through pattern;Decreased stance time - right;Decreased step length - left;Decreased step length - right;Decreased weight shift to right     General Gait Details: cues for posture and R heel strike initially; pt with much improved step length symmetry and weightbearing on R LE   Stairs            Wheelchair Mobility    Modified Rankin (Stroke Patients Only)       Balance    Sitting-balance support: No upper extremity supported;Feet supported Sitting balance-Leahy Scale: Good     Standing balance support: Single extremity supported Standing balance-Leahy Scale: Fair                              Cognition Arousal/Alertness: Awake/alert Behavior During Therapy: WFL for tasks assessed/performed Overall Cognitive Status: Within Functional Limits for tasks assessed                                        Exercises Total Joint Exercises Quad Sets: AROM;Right;10 reps Short Arc Quad: AROM;Right;10 reps Heel Slides: AROM;Right;10 reps Straight Leg Raises: AROM;Right;10 reps Goniometric ROM: 8-90    General Comments        Pertinent Vitals/Pain Pain Assessment: Faces Faces Pain Scale: Hurts little more Pain Location: R knee with therex Pain Descriptors / Indicators: Sore;Guarding Pain Intervention(s): Monitored during session;Premedicated before session;Repositioned;Ice applied    Home Living                      Prior Function            PT Goals (current goals can now be found in the care plan section) Acute Rehab PT Goals PT Goal Formulation: With patient Time For Goal Achievement: 05/20/17 Potential to Achieve Goals: Good Progress towards PT goals: Progressing toward goals    Frequency    7X/week      PT Plan Current plan remains appropriate    Co-evaluation  AM-PAC PT "6 Clicks" Daily Activity  Outcome Measure  Difficulty turning over in bed (including adjusting bedclothes, sheets and blankets)?: A Little Difficulty moving from lying on back to sitting on the side of the bed? : A Little Difficulty sitting down on and standing up from a chair with arms (e.g., wheelchair, bedside commode, etc,.)?: A Little Help needed moving to and from a bed to chair (including a wheelchair)?: A Little Help needed walking in hospital room?: A Little Help needed climbing 3-5 steps with a  railing? : A Little 6 Click Score: 18    End of Session Equipment Utilized During Treatment: Gait belt Activity Tolerance: Patient tolerated treatment well Patient left: with call bell/phone within reach;with family/visitor present;in chair Nurse Communication: Mobility status PT Visit Diagnosis: Other abnormalities of gait and mobility (R26.89);Pain Pain - Right/Left: Right Pain - part of body: Knee     Time: 3343-5686 PT Time Calculation (min) (ACUTE ONLY): 31 min  Charges:  $Gait Training: 8-22 mins $Therapeutic Exercise: 8-22 mins                    G Codes:       Erline Levine, PTA Pager: [phone]     Carolynne Edouard 05/16/2017, 1:26 PM

## 2017-05-16 NOTE — Discharge Summary (Signed)
SPORTS MEDICINE & JOINT REPLACEMENT   Georgena Spurling, MD   Laurier Nancy, PA-C [address] [place], [place], [place]  [address]                             [phone]  PATIENT ID: Cynthia Richardson        MRN:  [id number]          DOB/AGE: 02-29-1966 / 51 y.o.    DISCHARGE SUMMARY  ADMISSION DATE:    05/13/2017 DISCHARGE DATE:   05/16/2017   ADMISSION DIAGNOSIS: primary osteoarthritis right knee    DISCHARGE DIAGNOSIS:  primary osteoarthritis right knee    ADDITIONAL DIAGNOSIS: Active Problems:   S/P total knee replacement  Past Medical History:  Diagnosis Date  . Anxiety   . Arthritis   . Bipolar disorder (HCC)   . Bulging lumbar disc   . CHF (congestive heart failure) (HCC)    2011  . Chronic back pain   . Degenerative disc disease   . Depression   . History of kidney stones   . Hypertension   . Hypertensive encephalopathy   . Nonischemic cardiomyopathy (HCC)    stress inducted (Takotsubo type) 12/2009 in the setting of unintentional drug OD with acute respiratory and renal failure; No CAD by 12/2009 cath; EF recovered (> 55% '15, '17)  . Osteoarthritis of knees, bilateral   . Pneumonia   . Respiratory failure [facility])    March 2011  . Seizures (HCC)     PROCEDURE: Procedure(s): RIGHT TOTAL KNEE ARTHROPLASTY on 05/13/2017  CONSULTS:    HISTORY:  See H&P in chart  HOSPITAL COURSE:  Cynthia Richardson is a 51 y.o. admitted on 05/13/2017 and found to have a diagnosis of primary osteoarthritis right knee.  After appropriate laboratory studies were obtained  they were taken to the operating room on 05/13/2017 and underwent Procedure(s): RIGHT TOTAL KNEE ARTHROPLASTY.   They were given perioperative antibiotics:  Anti-infectives    Start     Dose/Rate Route Frequency Ordered Stop   05/13/17 1600  ceFAZolin (ANCEF) IVPB 1 g/50 mL premix     1 g 100 mL/hr over 30 Minutes Intravenous Every 6 hours 05/13/17 1503 05/13/17 2136   05/13/17 0900  ceFAZolin (ANCEF) 3 g in dextrose 5 % 50  mL IVPB     3 g 130 mL/hr over 30 Minutes Intravenous To ShortStay Surgical 05/10/17 1237 05/13/17 1454    .  Patient given tranexamic acid IV or topical and exparel intra-operatively.  Tolerated the procedure well.    POD# 1: Vital signs were stable.  Patient denied Chest pain, shortness of breath, or calf pain.  Patient was started on Lovenox 30 mg subcutaneously twice daily at 8am.  Consults to PT, OT, and care management were made.  The patient was weight bearing as tolerated.  CPM was placed on the operative leg 0-90 degrees for 6-8 hours a day. When out of the CPM, patient was placed in the foam block to achieve full extension. Incentive spirometry was taught.  Dressing was changed.       POD #2, Continued  PT for ambulation and exercise program.  IV saline locked.  O2 discontinued.    The remainder of the hospital course was dedicated to ambulation and strengthening.   The patient was discharged on 3 Days Post-Op in  Good condition.  Blood products given:none  DIAGNOSTIC STUDIES: Recent vital signs: Patient Vitals for the  past 24 hrs:  BP Temp Temp src Pulse Resp SpO2  05/16/17 0600 121/72 98.3 F (36.8 C) Oral 60 16 98 %  05/15/17 1444 (!) 105/58 98.3 F (36.8 C) Oral 72 - 98 %       Recent laboratory studies:  Recent Labs  05/14/17 0352 05/15/17 0533 05/16/17 0302  WBC 16.7* 16.4* 12.3*  HGB 11.1* 11.2* 11.3*  HCT 33.6* 33.9* 34.9*  PLT 260 268 260    Recent Labs  05/14/17 0352  NA 134*  K 4.4  CL 104  CO2 23  BUN 18  CREATININE 0.95  GLUCOSE 154*  CALCIUM 8.5*   Lab Results  Component Value Date   INR 1.08 12/30/2009     Recent Radiographic Studies :  No results found.  DISCHARGE INSTRUCTIONS: Discharge Instructions    CPM    Complete by:  As directed    Continuous passive motion machine (CPM):      Use the CPM from 0 to 90 for 4-6 hours per day.      You may increase by 10 per day.  You may break it up into 2 or 3 sessions per day.       Use CPM for 2 weeks or until you are told to stop.   Call MD / Call 911    Complete by:  As directed    If you experience chest pain or shortness of breath, CALL 911 and be transported to the hospital emergency room.  If you develope a fever above 101 F, pus (white drainage) or increased drainage or redness at the wound, or calf pain, call your surgeon's office.   Constipation Prevention    Complete by:  As directed    Drink plenty of fluids.  Prune juice may be helpful.  You may use a stool softener, such as Colace (over the counter) 100 mg twice a day.  Use MiraLax (over the counter) for constipation as needed.   Diet - low sodium heart healthy    Complete by:  As directed    Discharge instructions    Complete by:  As directed    INSTRUCTIONS AFTER JOINT REPLACEMENT   Remove items at home which could result in a fall. This includes throw rugs or furniture in walking pathways ICE to the affected joint every three hours while awake for 30 minutes at a time, for at least the first 3-5 days, and then as needed for pain and swelling.  Continue to use ice for pain and swelling. You may notice swelling that will progress down to the foot and ankle.  This is normal after surgery.  Elevate your leg when you are not up walking on it.   Continue to use the breathing machine you got in the hospital (incentive spirometer) which will help keep your temperature down.  It is common for your temperature to cycle up and down following surgery, especially at night when you are not up moving around and exerting yourself.  The breathing machine keeps your lungs expanded and your temperature down.   DIET:  As you were doing prior to hospitalization, we recommend a well-balanced diet.  DRESSING / WOUND CARE / SHOWERING  Keep the surgical dressing until follow up.  The dressing is water proof, so you can shower without any extra covering.  IF THE DRESSING FALLS OFF or the wound gets wet inside, change the dressing  with sterile gauze.  Please use good hand washing techniques before changing the dressing.  Do not use any lotions or creams on the incision until instructed by your surgeon.    ACTIVITY  Increase activity slowly as tolerated, but follow the weight bearing instructions below.   No driving for 6 weeks or until further direction given by your physician.  You cannot drive while taking narcotics.  No lifting or carrying greater than 10 lbs. until further directed by your surgeon. Avoid periods of inactivity such as sitting longer than an hour when not asleep. This helps prevent blood clots.  You may return to work once you are authorized by your doctor.     WEIGHT BEARING   Weight bearing as tolerated with assist device (walker, cane, etc) as directed, use it as long as suggested by your surgeon or therapist, typically at least 4-6 weeks.   EXERCISES  Results after joint replacement surgery are often greatly improved when you follow the exercise, range of motion and muscle strengthening exercises prescribed by your doctor. Safety measures are also important to protect the joint from further injury. Any time any of these exercises cause you to have increased pain or swelling, decrease what you are doing until you are comfortable again and then slowly increase them. If you have problems or questions, call your caregiver or physical therapist for advice.   Rehabilitation is important following a joint replacement. After just a few days of immobilization, the muscles of the leg can become weakened and shrink (atrophy).  These exercises are designed to build up the tone and strength of the thigh and leg muscles and to improve motion. Often times heat used for twenty to thirty minutes before working out will loosen up your tissues and help with improving the range of motion but do not use heat for the first two weeks following surgery (sometimes heat can increase post-operative swelling).   These  exercises can be done on a training (exercise) mat, on the floor, on a table or on a bed. Use whatever works the best and is most comfortable for you.    Use music or television while you are exercising so that the exercises are a pleasant break in your day. This will make your life better with the exercises acting as a break in your routine that you can look forward to.   Perform all exercises about fifteen times, three times per day or as directed.  You should exercise both the operative leg and the other leg as well.   Exercises include:   Quad Sets - Tighten up the muscle on the front of the thigh (Quad) and hold for 5-10 seconds.   Straight Leg Raises - With your knee straight (if you were given a brace, keep it on), lift the leg to 60 degrees, hold for 3 seconds, and slowly lower the leg.  Perform this exercise against resistance later as your leg gets stronger.  Leg Slides: Lying on your back, slowly slide your foot toward your buttocks, bending your knee up off the floor (only go as far as is comfortable). Then slowly slide your foot back down until your leg is flat on the floor again.  Angel Wings: Lying on your back spread your legs to the side as far apart as you can without causing discomfort.  Hamstring Strength:  Lying on your back, push your heel against the floor with your leg straight by tightening up the muscles of your buttocks.  Repeat, but this time bend your knee to a comfortable angle, and push your heel against  the floor.  You may put a pillow under the heel to make it more comfortable if necessary.   A rehabilitation program following joint replacement surgery can speed recovery and prevent re-injury in the future due to weakened muscles. Contact your doctor or a physical therapist for more information on knee rehabilitation.    CONSTIPATION  Constipation is defined medically as fewer than three stools per week and severe constipation as less than one stool per week.  Even if  you have a regular bowel pattern at home, your normal regimen is likely to be disrupted due to multiple reasons following surgery.  Combination of anesthesia, postoperative narcotics, change in appetite and fluid intake all can affect your bowels.   YOU MUST use at least one of the following options; they are listed in order of increasing strength to get the job done.  They are all available over the counter, and you may need to use some, POSSIBLY even all of these options:    Drink plenty of fluids (prune juice may be helpful) and high fiber foods Colace 100 mg by mouth twice a day  Senokot for constipation as directed and as needed Dulcolax (bisacodyl), take with full glass of water  Miralax (polyethylene glycol) once or twice a day as needed.  If you have tried all these things and are unable to have a bowel movement in the first 3-4 days after surgery call either your surgeon or your primary doctor.    If you experience loose stools or diarrhea, hold the medications until you stool forms back up.  If your symptoms do not get better within 1 week or if they get worse, check with your doctor.  If you experience "the worst abdominal pain ever" or develop nausea or vomiting, please contact the office immediately for further recommendations for treatment.   ITCHING:  If you experience itching with your medications, try taking only a single pain pill, or even half a pain pill at a time.  You can also use Benadryl over the counter for itching or also to help with sleep.   TED HOSE STOCKINGS:  Use stockings on both legs until for at least 2 weeks or as directed by physician office. They may be removed at night for sleeping.  MEDICATIONS:  See your medication summary on the "After Visit Summary" that nursing will review with you.  You may have some home medications which will be placed on hold until you complete the course of blood thinner medication.  It is important for you to complete the blood  thinner medication as prescribed.  PRECAUTIONS:  If you experience chest pain or shortness of breath - call 911 immediately for transfer to the hospital emergency department.   If you develop a fever greater that 101 F, purulent drainage from wound, increased redness or drainage from wound, foul odor from the wound/dressing, or calf pain - CONTACT YOUR SURGEON.                                                   FOLLOW-UP APPOINTMENTS:  If you do not already have a post-op appointment, please call the office for an appointment to be seen by your surgeon.  Guidelines for how soon to be seen are listed in your "After Visit Summary", but are typically between 1-4 weeks after surgery.  OTHER  INSTRUCTIONS:   Knee Replacement:  Do not place pillow under knee, focus on keeping the knee straight while resting. CPM instructions: 0-90 degrees, 2 hours in the morning, 2 hours in the afternoon, and 2 hours in the evening. Place foam block, curve side up under heel at all times except when in CPM or when walking.  DO NOT modify, tear, cut, or change the foam block in any way.  MAKE SURE YOU:  Understand these instructions.  Get help right away if you are not doing well or get worse.    Thank you for letting us be a part of your medical care team.  It is a privilege we respect greatly.  We hope these instructions will help you stay on track for a fast and full recovery!   Increase activity slowly as tolerated    Complete by:  As directed       DISCHARGE MEDICATIONS:   Allergies as of 05/16/2017      Reactions   No Known Allergies       Medication List    STOP taking these medications   acetaminophen 500 MG tablet Commonly known as:  TYLENOL   predniSONE 20 MG tablet Commonly known as:  DELTASONE     TAKE these medications   ARIPiprazole 10 MG tablet Commonly known as:  ABILIFY Take 10 mg by mouth at bedtime.   aspirin 325 MG EC tablet Take 1 tablet (325 mg total) by mouth 2 (two) times  daily.   atenolol-chlorthalidone 50-25 MG tablet Commonly known as:  TENORETIC Take 1 tablet by mouth daily.   DULoxetine 30 MG capsule Commonly known as:  CYMBALTA Take 30 mg by mouth 2 (two) times daily.   HYDROcodone-acetaminophen 10-325 MG tablet Commonly known as:  NORCO Take 1 tablet by mouth every 4 (four) hours as needed (breakthrough pain).   methocarbamol 500 MG tablet Commonly known as:  ROBAXIN Take 1-2 tablets (500-1,000 mg total) by mouth every 6 (six) hours as needed for muscle spasms.   Oxcarbazepine 300 MG tablet Commonly known as:  TRILEPTAL Take 300 mg by mouth 2 (two) times daily.   traMADol 50 MG tablet Commonly known as:  ULTRAM Take 1-2 tablets (50-100 mg total) by mouth every 6 (six) hours.   traZODone 50 MG tablet Commonly known as:  DESYREL Take 50 mg by mouth at bedtime.            Durable Medical Equipment        Start     Ordered   05/13/17 1504  DME Walker rolling  Once    Question:  Patient needs a walker to treat with the following condition  Answer:  S/P total knee replacement   05/13/17 1503   05/13/17 1504  DME 3 n 1  Once     05/13/17 1503   05/13/17 1504  DME Bedside commode  Once    Question:  Patient needs a bedside commode to treat with the following condition  Answer:  S/P total knee replacement   05/13/17 1503      FOLLOW UP VISIT:   Follow-up Information    [facility] Follow up.   Specialty:  Home Health Services Why:  A representative from [facility] will contact you to arrange start date and time for your therapy. Contact information: [address] [place] [address] [place] [address] [phone]           DISPOSITION: HOME VS. SNF  CONDITION:  Good   Guy Sandifer 05/16/2017, 8:15 AM

## 2017-05-19 ENCOUNTER — Encounter (HOSPITAL_COMMUNITY): Payer: Self-pay | Admitting: Emergency Medicine

## 2017-05-19 ENCOUNTER — Emergency Department (HOSPITAL_COMMUNITY)
Admission: EM | Admit: 2017-05-19 | Discharge: 2017-05-19 | Disposition: A | Discharge status: Home / Self Care | Payer: Medicare Other | Attending: Emergency Medicine | Admitting: Emergency Medicine

## 2017-05-19 DIAGNOSIS — M5126 Other intervertebral disc displacement, lumbar region: Secondary | ICD-10-CM | POA: Diagnosis not present

## 2017-05-19 DIAGNOSIS — Z96651 Presence of right artificial knee joint: Secondary | ICD-10-CM | POA: Diagnosis not present

## 2017-05-19 DIAGNOSIS — M1712 Unilateral primary osteoarthritis, left knee: Secondary | ICD-10-CM | POA: Diagnosis not present

## 2017-05-19 DIAGNOSIS — M25561 Pain in right knee: Secondary | ICD-10-CM | POA: Insufficient documentation

## 2017-05-19 DIAGNOSIS — Z5329 Procedure and treatment not carried out because of patient's decision for other reasons: Secondary | ICD-10-CM | POA: Diagnosis not present

## 2017-05-19 DIAGNOSIS — I110 Hypertensive heart disease with heart failure: Secondary | ICD-10-CM | POA: Insufficient documentation

## 2017-05-19 DIAGNOSIS — G8929 Other chronic pain: Secondary | ICD-10-CM | POA: Diagnosis not present

## 2017-05-19 DIAGNOSIS — Z471 Aftercare following joint replacement surgery: Secondary | ICD-10-CM | POA: Diagnosis not present

## 2017-05-19 DIAGNOSIS — I509 Heart failure, unspecified: Secondary | ICD-10-CM | POA: Insufficient documentation

## 2017-05-19 DIAGNOSIS — G8918 Other acute postprocedural pain: Secondary | ICD-10-CM | POA: Diagnosis not present

## 2017-05-19 DIAGNOSIS — Z79899 Other long term (current) drug therapy: Secondary | ICD-10-CM | POA: Insufficient documentation

## 2017-05-19 MED ORDER — KETOROLAC TROMETHAMINE 60 MG/2ML IM SOLN
15.0000 mg | Freq: Once | INTRAMUSCULAR | Status: AC
  Administered 2017-05-19: 15 mg via INTRAMUSCULAR
  Filled 2017-05-19: qty 2

## 2017-05-19 MED ORDER — MORPHINE SULFATE 15 MG PO TABS: 15.0000 mg | ORAL_TABLET | Freq: Four times a day (QID) | ORAL | 0 refills | Status: DC | PRN

## 2017-05-19 MED ORDER — OXYCODONE HCL 5 MG PO TABS
5.0000 mg | ORAL_TABLET | Freq: Once | ORAL | Status: AC
  Administered 2017-05-19: 5 mg via ORAL
  Filled 2017-05-19: qty 1

## 2017-05-19 MED ORDER — ACETAMINOPHEN 500 MG PO TABS
1000.0000 mg | ORAL_TABLET | Freq: Once | ORAL | Status: AC
  Administered 2017-05-19: 1000 mg via ORAL
  Filled 2017-05-19: qty 2

## 2017-05-19 NOTE — ED Provider Notes (Signed)
[facility]-EMERGENCY DEPT Provider Note   CSN: [id number] Arrival date & time: 05/19/17  4098     History   Chief Complaint Chief Complaint  Patient presents with  . Knee Pain  . Medication Refill    HPI Cynthia Richardson is a 51 y.o. female.  51 yo F with a chief complaint of right knee pain. She had knee replacement performed this week. She was discharged 2 days ago. In that time she has gone through 40 Norco's. She is not known where they have gone. She has been taking 2 of them at a time. Denies fevers, sudden increase in pain.  No increase in swelling.  Using ROM machine without issue.   The patient has been ambulating on this leg even though she was instructed not to. She said she is a walker for about half a day and then discarded it.    The history is provided by the patient.  Knee Pain   This is a new problem. The current episode started 2 days ago. The problem occurs constantly. The problem has not changed since onset.The pain is present in the right knee. The pain is at a severity of 7/10. The pain is moderate. She has tried nothing for the symptoms. The treatment provided no relief. There has been no history of extremity trauma.  Medication Refill    Past Medical History:  Diagnosis Date  . Anxiety   . Arthritis   . Bipolar disorder (HCC)   . Bulging lumbar disc   . CHF (congestive heart failure) (HCC)    2011  . Chronic back pain   . Degenerative disc disease   . Depression   . History of kidney stones   . Hypertension   . Hypertensive encephalopathy   . Nonischemic cardiomyopathy (HCC)    stress inducted (Takotsubo type) 12/2009 in the setting of unintentional drug OD with acute respiratory and renal failure; No CAD by 12/2009 cath; EF recovered (> 55% '15, '17)  . Osteoarthritis of knees, bilateral   . Pneumonia   . Respiratory failure [facility])    March 2011  . Seizures [facility])     Patient Active Problem List   Diagnosis Date Noted  . S/P total knee replacement  05/13/2017  . CEREBRAL EDEMA 01/19/2010  . CARDIOMYOPATHY 01/19/2010  . PULMONARY EDEMA 01/19/2010  . RESPIRATORY FAILURE 01/19/2010  . RENAL FAILURE 01/19/2010    Past Surgical History:  Procedure Laterality Date  . ABDOMINAL HYSTERECTOMY    . APPENDECTOMY    . CHOLECYSTECTOMY    . JOINT REPLACEMENT    . TONSILLECTOMY    . TOTAL KNEE ARTHROPLASTY Right 05/13/2017   Procedure: RIGHT TOTAL KNEE ARTHROPLASTY;  Surgeon: Dannielle Huh, MD;  Location: [facility] OR;  Service: Orthopedics;  Laterality: Right;    OB History    No data available       Home Medications    Prior to Admission medications   Medication Sig Start Date End Date Taking? Authorizing Provider  ARIPiprazole (ABILIFY) 10 MG tablet Take 10 mg by mouth at bedtime.    [provider]  aspirin EC 325 MG EC tablet Take 1 tablet (325 mg total) by mouth 2 (two) times daily. 05/14/17   Guy Sandifer, PA  atenolol-chlorthalidone (TENORETIC) 50-25 MG tablet Take 1 tablet by mouth daily. 11/01/15   [provider]  DULoxetine (CYMBALTA) 30 MG capsule Take 30 mg by mouth 2 (two) times daily.     [provider]  HYDROcodone-acetaminophen (  NORCO) 10-325 MG tablet Take 1 tablet by mouth every 4 (four) hours as needed (breakthrough pain). 05/14/17   Guy Sandifer, PA  methocarbamol (ROBAXIN) 500 MG tablet Take 1-2 tablets (500-1,000 mg total) by mouth every 6 (six) hours as needed for muscle spasms. 05/14/17   Guy Sandifer, PA  morphine (MSIR) 15 MG tablet Take 1 tablet (15 mg total) by mouth every 6 (six) hours as needed for severe pain. 05/19/17   Melene Plan, DO  Oxcarbazepine (TRILEPTAL) 300 MG tablet Take 300 mg by mouth 2 (two) times daily. 03/26/17   [provider]  traMADol (ULTRAM) 50 MG tablet Take 1-2 tablets (50-100 mg total) by mouth every 6 (six) hours. 05/14/17   Guy Sandifer, PA  traZODone (DESYREL) 50 MG tablet Take 50 mg by mouth at bedtime. 03/26/17   [provider]    Family History Family History  Problem Relation Age of Onset  . Emphysema Mother   . Hypertension Mother   . Liver disease Father     Social History Social History  Substance Use Topics  . Smoking status: Never Smoker  . Smokeless tobacco: Never Used  . Alcohol use No     Allergies   No known allergies   Review of Systems Review of Systems  Constitutional: Negative for chills and fever.  HENT: Negative for congestion and rhinorrhea.   Eyes: Negative for redness and visual disturbance.  Respiratory: Negative for shortness of breath and wheezing.   Cardiovascular: Negative for chest pain and palpitations.  Gastrointestinal: Negative for nausea and vomiting.  Genitourinary: Negative for dysuria and urgency.  Musculoskeletal: Positive for arthralgias, gait problem and myalgias.  Skin: Negative for pallor and wound.  Neurological: Negative for dizziness and headaches.     Physical Exam Updated Vital Signs BP (!) 158/95   Pulse 71   Temp 98.2 F (36.8 C) (Oral)   Resp 18   SpO2 98%   Physical Exam  Constitutional: She is oriented to person, place, and time. She appears well-developed and well-nourished. No distress.  HENT:  Head: Normocephalic and atraumatic.  Eyes: Pupils are equal, round, and reactive to light. EOM are normal.  Neck: Normal range of motion. Neck supple.  Cardiovascular: Normal rate and regular rhythm.  Exam reveals no gallop and no friction rub.   No murmur heard. Pulmonary/Chest: Effort normal. She has no wheezes. She has no rales.  Abdominal: Soft. She exhibits no distension. There is no tenderness.  Musculoskeletal: She exhibits edema and tenderness.  Mild swelling to the right knee. Incision is clean dry and intact. No noted erythema or increased warmth. Able to straight leg raise her legs off the bed.  Neurological: She is alert and oriented to person, place, and time.  Skin: Skin is warm and dry. She is not diaphoretic.   Psychiatric: She has a normal mood and affect. Her behavior is normal.  Nursing note and vitals reviewed.    ED Treatments / Results  Labs (all labs ordered are listed, but only abnormal results are displayed) Labs Reviewed - No data to display  EKG  EKG Interpretation None       Radiology No results found.  Procedures Procedures (including critical care time)  Medications Ordered in ED Medications  ketorolac (TORADOL) injection 15 mg (not administered)  acetaminophen (TYLENOL) tablet 1,000 mg (not administered)  oxyCODONE (Oxy IR/ROXICODONE) immediate release tablet 5 mg (not administered)     Initial Impression / Assessment and Plan / ED  Course  I have reviewed the triage vital signs and the nursing notes.  Pertinent labs & imaging results that were available during my care of the patient were reviewed by me and considered in my medical decision making (see chart for details).     51 yo F With a chief complaint of right knee pain. Patient has gone through 40 opiate pills in 2 days time. I discussed with the patient that she needs to try and minimize her opiate use. The patient was given prescription without Tylenol. She will maximize her Tylenol and NSAID use at home. Take the opiates only as needed. She will see her orthopedist tomorrow.  7:31 AM:  I have discussed the diagnosis/risks/treatment options with the patient and believe the pt to be eligible for discharge home to follow-up with Ortho. We also discussed returning to the ED immediately if new or worsening sx occur. We discussed the sx which are most concerning (e.g., sudden worsening pain, fever, inability to tolerate by mouth) that necessitate immediate return. Medications administered to the patient during their visit and any new prescriptions provided to the patient are listed below.  Medications given during this visit Medications  ketorolac (TORADOL) injection 15 mg (not administered)  acetaminophen  (TYLENOL) tablet 1,000 mg (not administered)  oxyCODONE (Oxy IR/ROXICODONE) immediate release tablet 5 mg (not administered)     The patient appears reasonably screen and/or stabilized for discharge and I doubt any other medical condition or other [facility] requiring further screening, evaluation, or treatment in the ED at this time prior to discharge.    Final Clinical Impressions(s) / ED Diagnoses   Final diagnoses:  Acute pain of right knee    New Prescriptions New Prescriptions   MORPHINE (MSIR) 15 MG TABLET    Take 1 tablet (15 mg total) by mouth every 6 (six) hours as needed for severe pain.     Melene Plan, DO 05/19/17 [phone]

## 2017-05-19 NOTE — Discharge Instructions (Signed)

## 2017-05-19 NOTE — ED Notes (Signed)
Surgical bandage remains on knee, no excessive swelling, redness, or drainage noted

## 2017-05-19 NOTE — ED Triage Notes (Signed)
Pt presents with R knee pain that began about 2 hr PTA when she ran out of her pain medicine; pt had a total knee replacement Monday and discharged with pain medication; pt experienced breakthrough pain yesterday, called the ortho MD on call and the PA requested she double up and she ran out

## 2017-05-21 DIAGNOSIS — I509 Heart failure, unspecified: Secondary | ICD-10-CM | POA: Diagnosis not present

## 2017-05-21 DIAGNOSIS — Z471 Aftercare following joint replacement surgery: Secondary | ICD-10-CM | POA: Diagnosis not present

## 2017-05-21 DIAGNOSIS — G8929 Other chronic pain: Secondary | ICD-10-CM | POA: Diagnosis not present

## 2017-05-21 DIAGNOSIS — M5126 Other intervertebral disc displacement, lumbar region: Secondary | ICD-10-CM | POA: Diagnosis not present

## 2017-05-21 DIAGNOSIS — M1712 Unilateral primary osteoarthritis, left knee: Secondary | ICD-10-CM | POA: Diagnosis not present

## 2017-05-21 DIAGNOSIS — I110 Hypertensive heart disease with heart failure: Secondary | ICD-10-CM | POA: Diagnosis not present

## 2017-05-22 DIAGNOSIS — I509 Heart failure, unspecified: Secondary | ICD-10-CM | POA: Diagnosis not present

## 2017-05-22 DIAGNOSIS — M1712 Unilateral primary osteoarthritis, left knee: Secondary | ICD-10-CM | POA: Diagnosis not present

## 2017-05-22 DIAGNOSIS — Z471 Aftercare following joint replacement surgery: Secondary | ICD-10-CM | POA: Diagnosis not present

## 2017-05-22 DIAGNOSIS — I110 Hypertensive heart disease with heart failure: Secondary | ICD-10-CM | POA: Diagnosis not present

## 2017-05-22 DIAGNOSIS — M5126 Other intervertebral disc displacement, lumbar region: Secondary | ICD-10-CM | POA: Diagnosis not present

## 2017-05-22 DIAGNOSIS — G8929 Other chronic pain: Secondary | ICD-10-CM | POA: Diagnosis not present

## 2017-05-23 DIAGNOSIS — Z471 Aftercare following joint replacement surgery: Secondary | ICD-10-CM | POA: Diagnosis not present

## 2017-05-23 DIAGNOSIS — M25561 Pain in right knee: Secondary | ICD-10-CM | POA: Diagnosis not present

## 2017-05-23 DIAGNOSIS — G8929 Other chronic pain: Secondary | ICD-10-CM | POA: Diagnosis not present

## 2017-05-23 DIAGNOSIS — I110 Hypertensive heart disease with heart failure: Secondary | ICD-10-CM | POA: Diagnosis not present

## 2017-05-23 DIAGNOSIS — I509 Heart failure, unspecified: Secondary | ICD-10-CM | POA: Diagnosis not present

## 2017-05-23 DIAGNOSIS — Z96651 Presence of right artificial knee joint: Secondary | ICD-10-CM | POA: Diagnosis not present

## 2017-05-23 DIAGNOSIS — M1712 Unilateral primary osteoarthritis, left knee: Secondary | ICD-10-CM | POA: Diagnosis not present

## 2017-05-23 DIAGNOSIS — M5126 Other intervertebral disc displacement, lumbar region: Secondary | ICD-10-CM | POA: Diagnosis not present

## 2017-05-24 DIAGNOSIS — I509 Heart failure, unspecified: Secondary | ICD-10-CM | POA: Diagnosis not present

## 2017-05-24 DIAGNOSIS — M1712 Unilateral primary osteoarthritis, left knee: Secondary | ICD-10-CM | POA: Diagnosis not present

## 2017-05-24 DIAGNOSIS — Z471 Aftercare following joint replacement surgery: Secondary | ICD-10-CM | POA: Diagnosis not present

## 2017-05-24 DIAGNOSIS — I110 Hypertensive heart disease with heart failure: Secondary | ICD-10-CM | POA: Diagnosis not present

## 2017-05-24 DIAGNOSIS — G8929 Other chronic pain: Secondary | ICD-10-CM | POA: Diagnosis not present

## 2017-05-24 DIAGNOSIS — M5126 Other intervertebral disc displacement, lumbar region: Secondary | ICD-10-CM | POA: Diagnosis not present

## 2017-05-30 DIAGNOSIS — M25561 Pain in right knee: Secondary | ICD-10-CM | POA: Diagnosis not present

## 2017-05-30 DIAGNOSIS — M62551 Muscle wasting and atrophy, not elsewhere classified, right thigh: Secondary | ICD-10-CM | POA: Diagnosis not present

## 2017-05-30 DIAGNOSIS — M1711 Unilateral primary osteoarthritis, right knee: Secondary | ICD-10-CM | POA: Diagnosis not present

## 2017-05-30 DIAGNOSIS — R2689 Other abnormalities of gait and mobility: Secondary | ICD-10-CM | POA: Diagnosis not present

## 2017-06-11 DIAGNOSIS — R2689 Other abnormalities of gait and mobility: Secondary | ICD-10-CM | POA: Diagnosis not present

## 2017-06-11 DIAGNOSIS — M25561 Pain in right knee: Secondary | ICD-10-CM | POA: Diagnosis not present

## 2017-06-11 DIAGNOSIS — M1711 Unilateral primary osteoarthritis, right knee: Secondary | ICD-10-CM | POA: Diagnosis not present

## 2017-06-11 DIAGNOSIS — M62551 Muscle wasting and atrophy, not elsewhere classified, right thigh: Secondary | ICD-10-CM | POA: Diagnosis not present

## 2017-06-24 DIAGNOSIS — M1990 Unspecified osteoarthritis, unspecified site: Secondary | ICD-10-CM | POA: Diagnosis not present

## 2017-06-24 DIAGNOSIS — E119 Type 2 diabetes mellitus without complications: Secondary | ICD-10-CM | POA: Diagnosis not present

## 2017-06-24 DIAGNOSIS — M7122 Synovial cyst of popliteal space [Baker], left knee: Secondary | ICD-10-CM | POA: Diagnosis not present

## 2017-06-24 DIAGNOSIS — M79605 Pain in left leg: Secondary | ICD-10-CM | POA: Diagnosis not present

## 2017-06-24 DIAGNOSIS — M79604 Pain in right leg: Secondary | ICD-10-CM | POA: Diagnosis not present

## 2017-06-24 DIAGNOSIS — M7121 Synovial cyst of popliteal space [Baker], right knee: Secondary | ICD-10-CM | POA: Diagnosis not present

## 2017-06-24 DIAGNOSIS — I509 Heart failure, unspecified: Secondary | ICD-10-CM | POA: Diagnosis not present

## 2017-06-24 DIAGNOSIS — Z96651 Presence of right artificial knee joint: Secondary | ICD-10-CM | POA: Diagnosis not present

## 2017-06-24 IMAGING — DX DG CHEST 2V
2 series · 2 of 2 positions shown · non-contrast
Comparison: Single-view of the chest 07/15/2016 and 02/19/2016.

CLINICAL DATA: Shortness of breath, worsening over the past 2
weeks. Mid chest tightness.

EXAM:
CHEST  2 VIEW

[chest pa]
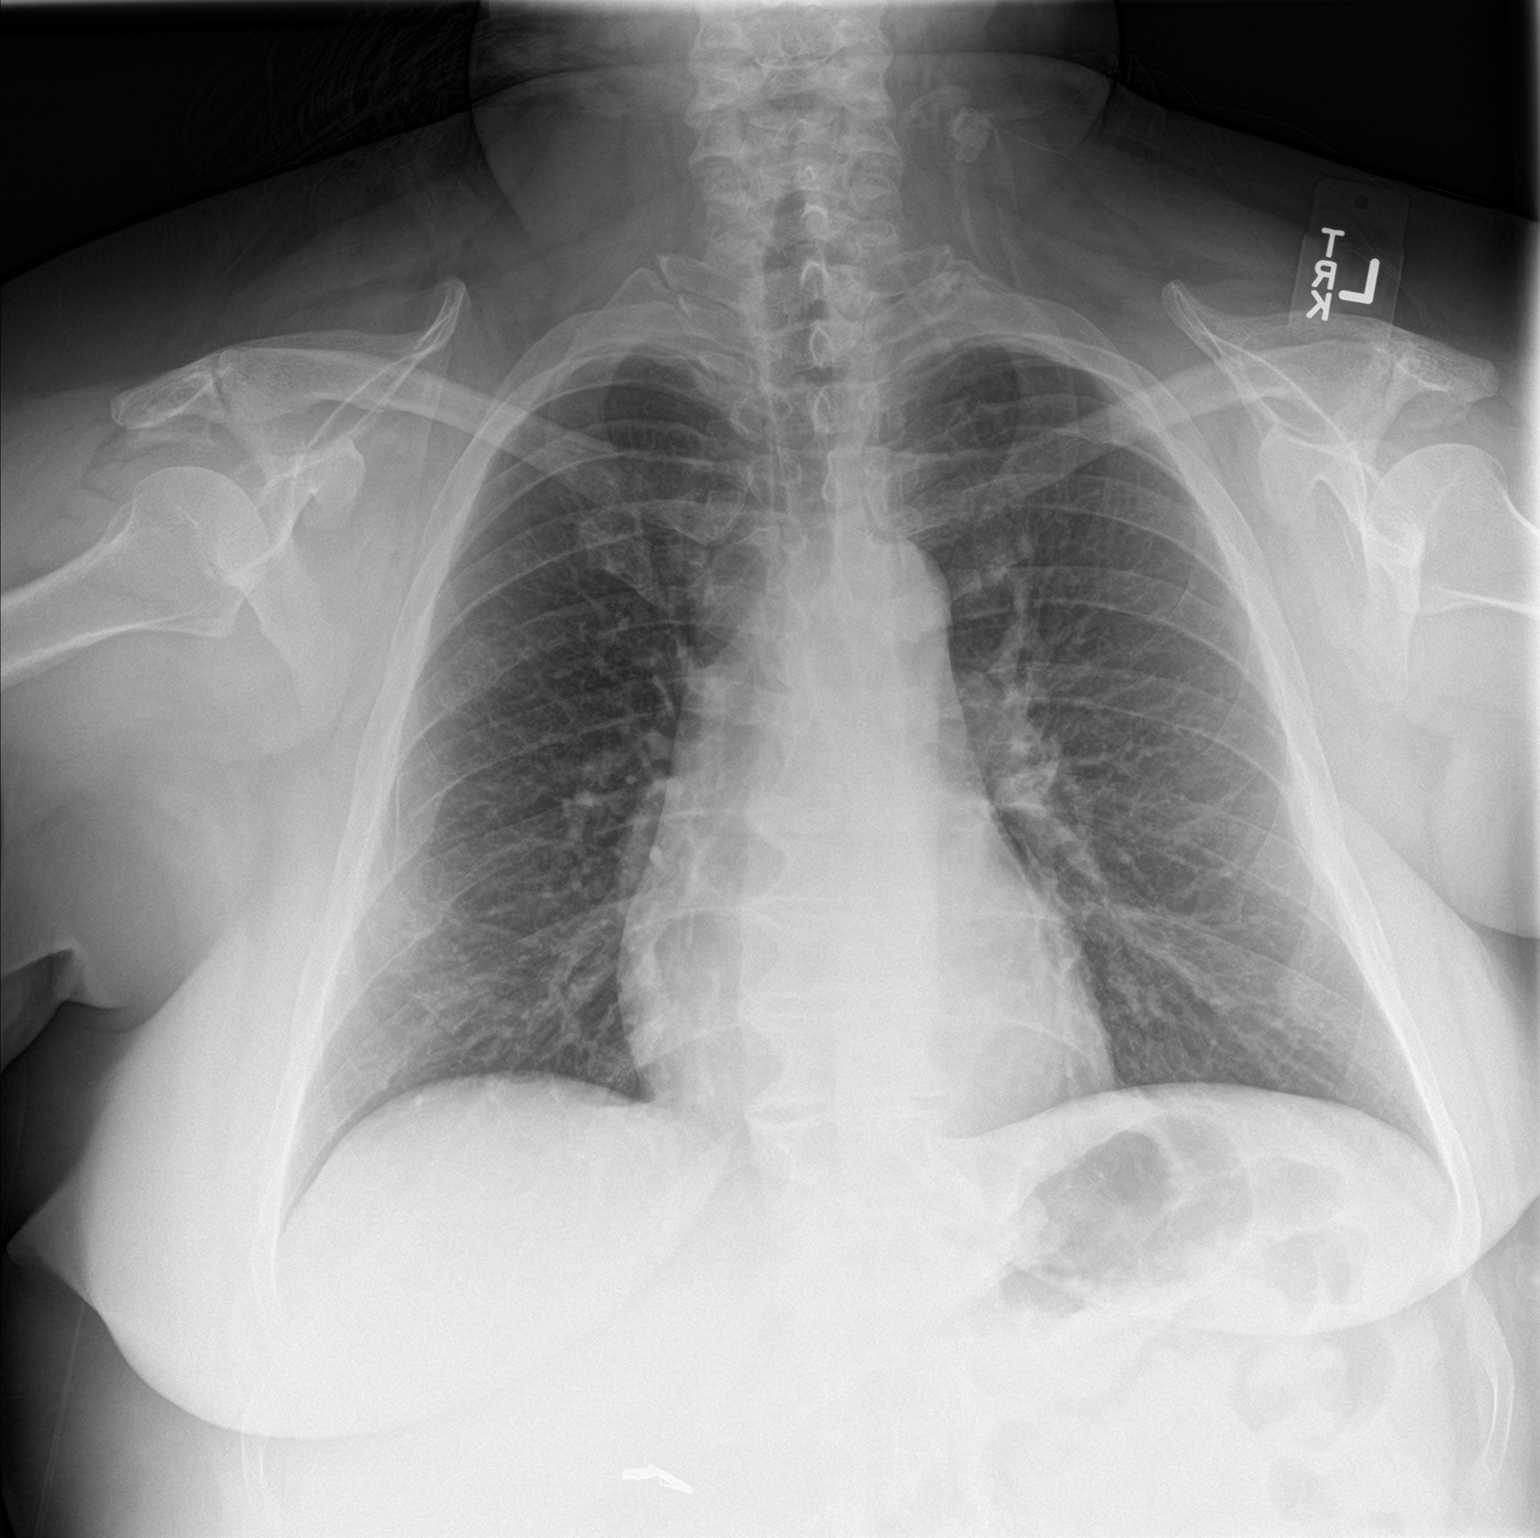

[chest lat]
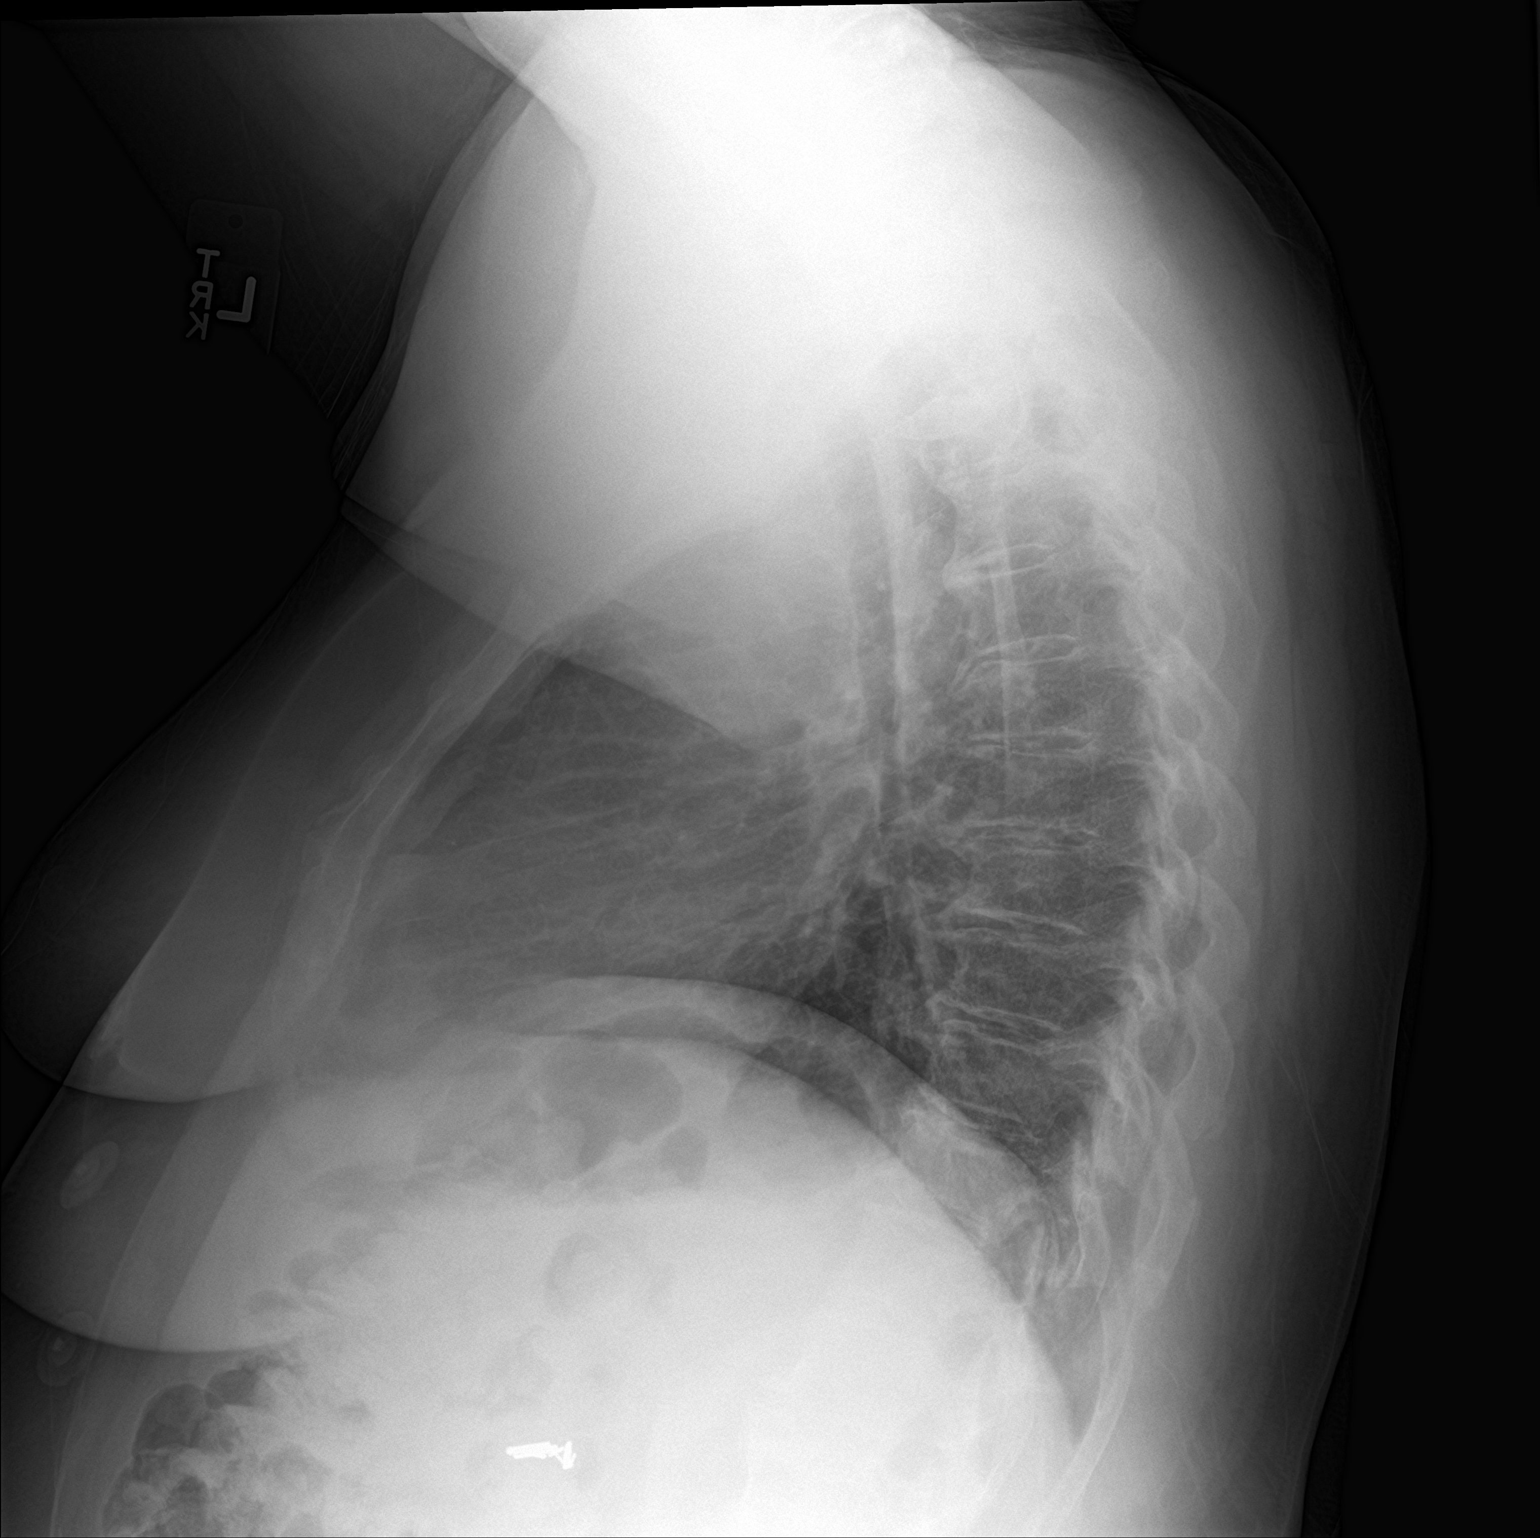

[2 of 2 positions shown; findings below may reference images not displayed]

FINDINGS: The lungs are clear. Heart size is normal. No pneumothorax or
pleural effusion. No focal bony abnormality. The patient is status
post cholecystectomy.
IMPRESSION: No acute disease.

## 2017-06-28 ENCOUNTER — Other Ambulatory Visit: Payer: Self-pay | Admitting: Orthopedic Surgery

## 2017-07-02 DIAGNOSIS — F314 Bipolar disorder, current episode depressed, severe, without psychotic features: Secondary | ICD-10-CM | POA: Diagnosis not present

## 2017-07-19 DIAGNOSIS — M25562 Pain in left knee: Secondary | ICD-10-CM | POA: Diagnosis not present

## 2017-07-19 DIAGNOSIS — Z780 Asymptomatic menopausal state: Secondary | ICD-10-CM | POA: Diagnosis not present

## 2017-07-19 DIAGNOSIS — M7122 Synovial cyst of popliteal space [Baker], left knee: Secondary | ICD-10-CM | POA: Diagnosis not present

## 2017-07-23 DIAGNOSIS — G8929 Other chronic pain: Secondary | ICD-10-CM | POA: Diagnosis not present

## 2017-07-23 DIAGNOSIS — M1712 Unilateral primary osteoarthritis, left knee: Secondary | ICD-10-CM | POA: Diagnosis not present

## 2017-07-25 DIAGNOSIS — G43909 Migraine, unspecified, not intractable, without status migrainosus: Secondary | ICD-10-CM | POA: Diagnosis not present

## 2017-07-25 DIAGNOSIS — I10 Essential (primary) hypertension: Secondary | ICD-10-CM | POA: Diagnosis not present

## 2017-08-07 NOTE — Pre-Procedure Instructions (Signed)
Cynthia Richardson  08/07/2017      [facility] - [place], [place] - [address] [place] [address] Phone: [phone] Fax: [phone]    Your procedure is scheduled on Monday, August 19, 2017  Report to [facility] Admitting Entrance "A" at 7:15 A.M.   Call this number if you have problems the morning of surgery:  [phone]   Remember:  Do not eat food or drink liquids after midnight.  Take these medicines the morning of surgery with A SIP OF WATER: ARIPiprazole (ABILIFY), DULoxetine (CYMBALTA), and Oxcarbazepine (TRILEPTAL). If needed HydrOXYzine (VISTARIL) for anxiety, and Acetaminophen (TYLENOL) for pain.  7 days before surgery (Oct. 22), stop taking all Aspirins, Vitamins, Fish oils, and Herbal medications. Also stop all NSAIDS i.e. Advil, Ibuprofen, Motrin, Aleve, Anaprox, Naproxen, BC and Goody Powders.   Do not wear jewelry, make-up or nail polish.  Do not wear lotions, powders, or perfumes, or deodorant.  Do not shave 48 hours prior to surgery.    Do not bring valuables to the hospital.  [facility] is not responsible for any belongings or valuables.  Contacts, dentures or bridgework may not be worn into surgery.  Leave your suitcase in the car.  After surgery it may be brought to your room.  For patients admitted to the hospital, discharge time will be determined by your treatment team.  Patients discharged the day of surgery will not be allowed to drive home.   Special instructions:   [facility]- Preparing For Surgery  Before surgery, you can play an important role. Because skin is not sterile, your skin needs to be as free of germs as possible. You can reduce the number of germs on your skin by washing with CHG (chlorahexidine gluconate) Soap before surgery.  CHG is an antiseptic cleaner which kills germs and bonds with the skin to continue killing germs even after washing.  Please do not use if you have an allergy  to CHG or antibacterial soaps. If your skin becomes reddened/irritated stop using the CHG.  Do not shave (including legs and underarms) for at least 48 hours prior to first CHG shower. It is OK to shave your face.  Please follow these instructions carefully.   1. Shower the NIGHT BEFORE SURGERY and the MORNING OF SURGERY with CHG.   2. If you chose to wash your hair, wash your hair first as usual with your normal shampoo.  3. After you shampoo, rinse your hair and body thoroughly to remove the shampoo.  4. Use CHG as you would any other liquid soap. You can apply CHG directly to the skin and wash gently with a scrungie or a clean washcloth.   5. Apply the CHG Soap to your body ONLY FROM THE NECK DOWN.  Do not use on open wounds or open sores. Avoid contact with your eyes, ears, mouth and genitals (private parts). Wash Face and genitals (private parts)  with your normal soap.  6. Wash thoroughly, paying special attention to the area where your surgery will be performed.  7. Thoroughly rinse your body with warm water from the neck down.  8. DO NOT shower/wash with your normal soap after using and rinsing off the CHG Soap.  9. Pat yourself dry with a CLEAN TOWEL.  10. Wear CLEAN PAJAMAS to bed the night before surgery, wear comfortable clothes the morning of surgery  11. Place CLEAN SHEETS on your bed the night  of your first shower and DO NOT SLEEP WITH PETS.  Day of Surgery: Do not apply any deodorants/lotions. Please wear clean clothes to the hospital/surgery center.    Please read over the following fact sheets that you were given. Pain Booklet, Coughing and Deep Breathing, MRSA Information and Surgical Site Infection Prevention

## 2017-08-08 ENCOUNTER — Encounter (HOSPITAL_COMMUNITY): Payer: Self-pay

## 2017-08-08 ENCOUNTER — Encounter (HOSPITAL_COMMUNITY)
Admission: RE | Admit: 2017-08-08 | Discharge: 2017-08-08 | Disposition: A | Discharge status: Home / Self Care | Payer: Medicare Other | Source: Ambulatory Visit | Attending: Orthopedic Surgery | Admitting: Orthopedic Surgery

## 2017-08-08 DIAGNOSIS — Z01818 Encounter for other preprocedural examination: Secondary | ICD-10-CM | POA: Diagnosis not present

## 2017-08-08 DIAGNOSIS — Z79899 Other long term (current) drug therapy: Secondary | ICD-10-CM | POA: Diagnosis not present

## 2017-08-08 DIAGNOSIS — Z7982 Long term (current) use of aspirin: Secondary | ICD-10-CM | POA: Insufficient documentation

## 2017-08-08 DIAGNOSIS — M1712 Unilateral primary osteoarthritis, left knee: Secondary | ICD-10-CM | POA: Diagnosis not present

## 2017-08-08 HISTORY — DX: Headache: R51

## 2017-08-08 HISTORY — DX: Headache, unspecified: R51.9

## 2017-08-08 LAB — CBC WITH DIFFERENTIAL/PLATELET
Basophils Absolute: 0 10*3/uL (ref 0.0–0.1)
Basophils Relative: 1 %
EOS ABS: 0.1 10*3/uL (ref 0.0–0.7)
EOS PCT: 1 %
HCT: 40.7 % (ref 36.0–46.0)
Hemoglobin: 13.4 g/dL (ref 12.0–15.0)
LYMPHS ABS: 2.8 10*3/uL (ref 0.7–4.0)
LYMPHS PCT: 34 %
MCH: 29.7 pg (ref 26.0–34.0)
MCHC: 32.9 g/dL (ref 30.0–36.0)
MCV: 90.2 fL (ref 78.0–100.0)
MONOS PCT: 8 %
Monocytes Absolute: 0.7 10*3/uL (ref 0.1–1.0)
Neutro Abs: 4.5 10*3/uL (ref 1.7–7.7)
Neutrophils Relative %: 56 %
PLATELETS: 258 10*3/uL (ref 150–400)
RBC: 4.51 MIL/uL (ref 3.87–5.11)
RDW: 12.8 % (ref 11.5–15.5)
WBC: 8.1 10*3/uL (ref 4.0–10.5)

## 2017-08-08 LAB — COMPREHENSIVE METABOLIC PANEL
ALK PHOS: 160 U/L — AB (ref 38–126)
ALT: 30 U/L (ref 14–54)
AST: 31 U/L (ref 15–41)
Albumin: 3.3 g/dL — ABNORMAL LOW (ref 3.5–5.0)
Anion gap: 7 (ref 5–15)
BUN: 13 mg/dL (ref 6–20)
CO2: 22 mmol/L (ref 22–32)
Calcium: 8.7 mg/dL — ABNORMAL LOW (ref 8.9–10.3)
Chloride: 106 mmol/L (ref 101–111)
Creatinine, Ser: 0.78 mg/dL (ref 0.44–1.00)
GLUCOSE: 104 mg/dL — AB (ref 65–99)
POTASSIUM: 4 mmol/L (ref 3.5–5.1)
SODIUM: 135 mmol/L (ref 135–145)
TOTAL PROTEIN: 7.7 g/dL (ref 6.5–8.1)
Total Bilirubin: 0.2 mg/dL — ABNORMAL LOW (ref 0.3–1.2)

## 2017-08-08 LAB — SURGICAL PCR SCREEN
MRSA, PCR: NEGATIVE
Staphylococcus aureus: NEGATIVE

## 2017-08-08 NOTE — Progress Notes (Addendum)
PCP is Marykay Lex, PA-C Denies seeing a Cardiologist.  Stress test from 12-03-15 Echo from 01-13-14 Card cath from 12-30-09  See Shonna Chock note from 05-01-17 Pt states she had an episode of resp arrest in 2011 and was dx with CHF.

## 2017-08-09 NOTE — Progress Notes (Signed)
Anesthesia Chart Review: Patient is a 51 year old female scheduled for left TKA on 08/19/17 by Dr. Dannielle Huh.  History includes never smoker, hypertension, CHF, non-ischemic cardiomyopathy (12/2009 in the setting of unintentional drug OD with acute respiratory/renal failure), bipolar disorder, depression, anxiety, hypertensive encephalopathy, seizures, chronic back pain, cholecystectomy, appendectomy, hysterectomy, right TKA 05/13/17. BMI is consistent with morbid obesity.  Hospitalized 12/2009 for unintentional drug overdose (acetaminophen, benzodiazepines, opiates) with fall (not sure how long she was down) and noted to be in acute renal failure (Cr 2.86) and acute respiratory failure with probable pulmonary edema requiring intubation. Troponin was initially negative, but developed tachycardia and ST changes on EKG with STAT 12/30/09 echo showing severely reduced LV function. Repeat echo (with limited imaging) on 01/02/17 showed "clear impression that LV function is significantly better than the recent study." Cardiac cath showed no angiographic CAD, so stress (Takotsubo type) cardiomyopathy suspected. Subsequent echo in 2015 showed normal LVEF and 2017 stress test was non-ischemic. She saw cardiologist Dr. Dietrich Pates during her 2011 admission. Previously she had seen Dr. Belva Crome on 03/29/08 (when he was with [facility]). His 2009 note indicates that she reported a previous history of NSTEMI in the setting of cocaine. Last cocaine use is documented as 2012.  PCP is Eunice Blase, PA-C with [facility].  Meds include Abilify, ASA (not currently taking), atenolol-chlorthalidone, Cymbalta, Norco, hydroxyzine, Trileptal, trazodone.  BP (!) 144/84   Pulse 63   Temp 36.8 C   Resp 20   Ht 5\' 5"  (1.651 m)   Wt 254 lb 9.6 oz (115.5 kg)   SpO2 95%   BMI 42.37 kg/m   EKG 08/30/16: SR with premature supraventricular complexes.  Nuclear stress test 12/03/15 [facility],  scanned under Media Tab, Correspondence 05/13/17): Impression: 1. No definitive scintigraphic evidence of prior infarction or pharmacologically induced ischemia. 2. Normal left ventricular wall motion. 3. Left ventricular ejection fraction 60%. 4. Low risk stress test findings.  Echo 01/13/14 [facility]; Care Everywhere): Interpretation Summary The left ventricle is normal in size. There is normal left ventricular wall thickness. The aortic valve is not well visualized, but is grossly normal. The left ventricular wall motion is normal. The left ventricular ejection fraction is normal (70-75%). The left atrium is mildly dilated. The left ventricular diastolic function is normal.  Cardiac cath 12/30/09: Impression:  No angiographic coronary disease, slow flow down the LAD suggesting microvascular dysfunction. LV systolic dysfunction was severely depressed by echo and there was moderately elevated left ventricular end-diastolic pressure. This appears to be a nonischemic cardiomyopathy. It is possible that this is a stress (Takotsubo type) cardiomyopathy. We will plan on starting the patient on milrinone drip at 0.25 mcg/kg to support her left ventricle. However should start when she has been out for 4 hours due to the possibility of an LV apical thrombus.  CXR 08/30/16: IMPRESSION: No acute disease.  Preoperative labs noted. Cr 0.78. AST/ALT 31/30. CBC WNL. Glucose 104.   She does have a history of non-ischemic stress inducted cardiomyopathy in 2011. No CAD at that time. Stress test was non-ischemic last year with normal LVEF. She tolerated similar surgery just over three months ago. If no acute changes then I anticipate that she can proceed as planned.  Velna Ochs [facility] Short Stay Center/Anesthesiology Phone [phone] 08/09/2017 6:32 PM

## 2017-08-16 MED ORDER — TRANEXAMIC ACID 1000 MG/10ML IV SOLN
1000.0000 mg | INTRAVENOUS | Status: AC
  Administered 2017-08-19: 1000 mg via INTRAVENOUS
  Filled 2017-08-16: qty 10

## 2017-08-16 MED ORDER — BUPIVACAINE LIPOSOME 1.3 % IJ SUSP
20.0000 mL | INTRAMUSCULAR | Status: AC
  Administered 2017-08-19: 20 mL
  Filled 2017-08-16: qty 20

## 2017-08-16 MED ORDER — GABAPENTIN 300 MG PO CAPS
300.0000 mg | ORAL_CAPSULE | ORAL | Status: AC
  Administered 2017-08-19: 300 mg via ORAL
  Filled 2017-08-16: qty 1

## 2017-08-16 MED ORDER — CEFAZOLIN SODIUM-DEXTROSE 2-4 GM/100ML-% IV SOLN
2.0000 g | INTRAVENOUS | Status: AC
  Administered 2017-08-19: 2 g via INTRAVENOUS
  Filled 2017-08-16: qty 100

## 2017-08-16 MED ORDER — ACETAMINOPHEN 500 MG PO TABS
1000.0000 mg | ORAL_TABLET | ORAL | Status: DC
  Filled 2017-08-16: qty 2

## 2017-08-16 MED ORDER — DEXAMETHASONE SODIUM PHOSPHATE 10 MG/ML IJ SOLN
8.0000 mg | INTRAMUSCULAR | Status: AC
  Administered 2017-08-19: 8 mg via INTRAVENOUS
  Filled 2017-08-16: qty 1

## 2017-08-18 NOTE — Anesthesia Preprocedure Evaluation (Addendum)
Anesthesia Evaluation  Patient identified by MRN, date of birth, ID band Patient awake    Reviewed: Allergy & Precautions, H&P , NPO status , Patient's Chart, lab work & pertinent test results, reviewed documented beta blocker date and time   History of Anesthesia Complications Negative for: history of anesthetic complications  Airway Mallampati: II  TM Distance: >3 FB Neck ROM: Full    Dental no notable dental hx. (+) Dental Advisory Given   Pulmonary neg pulmonary ROS,    Pulmonary exam normal breath sounds clear to auscultation       Cardiovascular hypertension, Pt. on medications and Pt. on home beta blockers (-) angina+ Past MI (h/o Takotsubo event 2011, fully recovered)  (-) CAD  Rhythm:Regular Rate:Normal  '17 ECHO: EF >55%   Neuro/Psych Seizures - (remote history), Well Controlled,  Anxiety Depression Bipolar Disorder    GI/Hepatic negative GI ROS, Neg liver ROS,   Endo/Other  Morbid obesity  Renal/GU negative Renal ROS     Musculoskeletal  (+) Arthritis ,   Abdominal (+) + obese,   Peds  Hematology negative hematology ROS (+)   Anesthesia Other Findings Hypertension    Arthritis   Bipolar disorder             Depression      Seizures     Hypertensive encephalopathy        Respiratory failure (HCC)  March 2011 Nonischemic cardiomyopathy ( CHF) stress inducted (Takotsubo type) 12/2009 in the setting of unintentional drug OD with acute respiratory and renal failure; No CAD by 12/2009 cath; EF recovered (> 55% '15, '17)          Reproductive/Obstetrics Post-menopausal, no sexual relations                            Anesthesia Physical  Anesthesia Plan  ASA: III  Anesthesia Plan: Spinal   Post-op Pain Management:  Regional for Post-op pain   Induction:   PONV Risk Score and Plan: 2 and Ondansetron, Dexamethasone and Treatment may vary due to age or medical  condition  Airway Management Planned: Natural Airway, Simple Face Mask and Nasal Cannula  Additional Equipment:   Intra-op Plan:   Post-operative Plan:   Informed Consent: I have reviewed the patients History and Physical, chart, labs and discussed the procedure including the risks, benefits and alternatives for the proposed anesthesia with the patient or authorized representative who has indicated his/her understanding and acceptance.   Dental advisory given  Plan Discussed with: CRNA and Surgeon  Anesthesia Plan Comments: (Plan routine monitors, SAB with adductor canal block for post op analgesia)       Anesthesia Quick Evaluation

## 2017-08-19 ENCOUNTER — Ambulatory Visit (HOSPITAL_COMMUNITY): Payer: Medicare Other | Admitting: Anesthesiology

## 2017-08-19 ENCOUNTER — Encounter (HOSPITAL_COMMUNITY)
Admission: RE | Disposition: A | Discharge status: Home / Self Care | Payer: Self-pay | Source: Ambulatory Visit | Attending: Orthopedic Surgery

## 2017-08-19 ENCOUNTER — Ambulatory Visit (HOSPITAL_COMMUNITY): Payer: Medicare Other | Admitting: Emergency Medicine

## 2017-08-19 ENCOUNTER — Inpatient Hospital Stay (HOSPITAL_COMMUNITY)
Admission: RE | Admit: 2017-08-19 | Discharge: 2017-08-22 | DRG: 470 | Disposition: A | Discharge status: Home / Self Care | Payer: Medicare Other | Source: Ambulatory Visit | Attending: Orthopedic Surgery | Admitting: Orthopedic Surgery

## 2017-08-19 ENCOUNTER — Encounter (HOSPITAL_COMMUNITY): Payer: Self-pay | Admitting: *Deleted

## 2017-08-19 DIAGNOSIS — I252 Old myocardial infarction: Secondary | ICD-10-CM

## 2017-08-19 DIAGNOSIS — Z96651 Presence of right artificial knee joint: Secondary | ICD-10-CM | POA: Diagnosis not present

## 2017-08-19 DIAGNOSIS — Z8249 Family history of ischemic heart disease and other diseases of the circulatory system: Secondary | ICD-10-CM | POA: Diagnosis not present

## 2017-08-19 DIAGNOSIS — I509 Heart failure, unspecified: Secondary | ICD-10-CM | POA: Diagnosis not present

## 2017-08-19 DIAGNOSIS — J9690 Respiratory failure, unspecified, unspecified whether with hypoxia or hypercapnia: Secondary | ICD-10-CM | POA: Diagnosis not present

## 2017-08-19 DIAGNOSIS — I110 Hypertensive heart disease with heart failure: Secondary | ICD-10-CM | POA: Diagnosis present

## 2017-08-19 DIAGNOSIS — Z90710 Acquired absence of both cervix and uterus: Secondary | ICD-10-CM

## 2017-08-19 DIAGNOSIS — M1712 Unilateral primary osteoarthritis, left knee: Secondary | ICD-10-CM | POA: Diagnosis not present

## 2017-08-19 DIAGNOSIS — Z23 Encounter for immunization: Secondary | ICD-10-CM

## 2017-08-19 DIAGNOSIS — F319 Bipolar disorder, unspecified: Secondary | ICD-10-CM | POA: Diagnosis not present

## 2017-08-19 DIAGNOSIS — I429 Cardiomyopathy, unspecified: Secondary | ICD-10-CM | POA: Diagnosis not present

## 2017-08-19 DIAGNOSIS — Z96659 Presence of unspecified artificial knee joint: Secondary | ICD-10-CM

## 2017-08-19 DIAGNOSIS — G8918 Other acute postprocedural pain: Secondary | ICD-10-CM | POA: Diagnosis not present

## 2017-08-19 HISTORY — PX: TOTAL KNEE ARTHROPLASTY: SHX125

## 2017-08-19 SURGERY — ARTHROPLASTY, KNEE, TOTAL
Anesthesia: Monitor Anesthesia Care | Site: Knee | Laterality: Left

## 2017-08-19 MED ORDER — ARIPIPRAZOLE 5 MG PO TABS
10.0000 mg | ORAL_TABLET | Freq: Every day | ORAL | Status: DC
Start: 2017-08-20 — End: 2017-08-22
  Administered 2017-08-19 – 2017-08-22 (×4): 10 mg via ORAL
  Filled 2017-08-19 (×4): qty 2

## 2017-08-19 MED ORDER — BUPIVACAINE-EPINEPHRINE (PF) 0.25% -1:200000 IJ SOLN
INTRAMUSCULAR | Status: DC | PRN
  Administered 2017-08-19: 30 mL

## 2017-08-19 MED ORDER — MIDAZOLAM HCL 2 MG/2ML IJ SOLN
INTRAMUSCULAR | Status: AC
  Filled 2017-08-19: qty 2

## 2017-08-19 MED ORDER — ALUM & MAG HYDROXIDE-SIMETH 200-200-20 MG/5ML PO SUSP
30.0000 mL | ORAL | Status: DC | PRN
Start: 2017-08-19 — End: 2017-08-22

## 2017-08-19 MED ORDER — GABAPENTIN 300 MG PO CAPS
300.0000 mg | ORAL_CAPSULE | Freq: Three times a day (TID) | ORAL | Status: DC
  Administered 2017-08-19 – 2017-08-22 (×9): 300 mg via ORAL
  Filled 2017-08-19 (×9): qty 1

## 2017-08-19 MED ORDER — CHLORTHALIDONE 25 MG PO TABS
25.0000 mg | ORAL_TABLET | Freq: Every day | ORAL | Status: DC
  Administered 2017-08-20 – 2017-08-22 (×3): 25 mg via ORAL
  Filled 2017-08-19 (×3): qty 1

## 2017-08-19 MED ORDER — ACETAMINOPHEN 325 MG PO TABS: 650.0000 mg | ORAL_TABLET | ORAL | Status: DC | PRN

## 2017-08-19 MED ORDER — CEFAZOLIN SODIUM-DEXTROSE 1-4 GM/50ML-% IV SOLN
1.0000 g | Freq: Four times a day (QID) | INTRAVENOUS | Status: AC
  Administered 2017-08-19 (×2): 1 g via INTRAVENOUS
  Filled 2017-08-19 (×2): qty 50

## 2017-08-19 MED ORDER — CELECOXIB 200 MG PO CAPS
200.0000 mg | ORAL_CAPSULE | Freq: Two times a day (BID) | ORAL | Status: DC
  Administered 2017-08-19 – 2017-08-22 (×7): 200 mg via ORAL
  Filled 2017-08-19 (×7): qty 1

## 2017-08-19 MED ORDER — HYDROXYZINE HCL 25 MG PO TABS: 25.0000 mg | ORAL_TABLET | Freq: Every day | ORAL | Status: DC | PRN

## 2017-08-19 MED ORDER — ONDANSETRON HCL 4 MG/2ML IJ SOLN
INTRAMUSCULAR | Status: DC | PRN
  Administered 2017-08-19: 4 mg via INTRAVENOUS

## 2017-08-19 MED ORDER — FENTANYL CITRATE (PF) 100 MCG/2ML IJ SOLN
INTRAMUSCULAR | Status: DC | PRN
  Administered 2017-08-19 (×3): 50 ug via INTRAVENOUS

## 2017-08-19 MED ORDER — FENTANYL CITRATE (PF) 100 MCG/2ML IJ SOLN
25.0000 ug | INTRAMUSCULAR | Status: DC | PRN
  Administered 2017-08-19 (×2): 50 ug via INTRAVENOUS

## 2017-08-19 MED ORDER — FENTANYL CITRATE (PF) 100 MCG/2ML IJ SOLN
25.0000 ug | INTRAMUSCULAR | Status: DC | PRN
  Administered 2017-08-19: 50 ug via INTRAVENOUS

## 2017-08-19 MED ORDER — HYDROCODONE-ACETAMINOPHEN 10-325 MG PO TABS
1.0000 | ORAL_TABLET | Freq: Four times a day (QID) | ORAL | Status: DC | PRN
  Administered 2017-08-19: 1 via ORAL
  Administered 2017-08-19 – 2017-08-22 (×5): 2 via ORAL
  Filled 2017-08-19 (×7): qty 2

## 2017-08-19 MED ORDER — HYDROMORPHONE HCL 1 MG/ML IJ SOLN
1.0000 mg | INTRAMUSCULAR | Status: DC | PRN
  Administered 2017-08-19 – 2017-08-22 (×31): 1 mg via INTRAVENOUS
  Filled 2017-08-19 (×32): qty 1

## 2017-08-19 MED ORDER — MENTHOL 3 MG MT LOZG: 1.0000 | LOZENGE | OROMUCOSAL | Status: DC | PRN

## 2017-08-19 MED ORDER — ONDANSETRON HCL 4 MG PO TABS: 4.0000 mg | ORAL_TABLET | Freq: Four times a day (QID) | ORAL | Status: DC | PRN

## 2017-08-19 MED ORDER — ATENOLOL-CHLORTHALIDONE 50-25 MG PO TABS: 1.0000 | ORAL_TABLET | Freq: Every day | ORAL | Status: DC

## 2017-08-19 MED ORDER — LACTATED RINGERS IV SOLN
INTRAVENOUS | Status: DC | PRN
  Administered 2017-08-19 (×2): via INTRAVENOUS

## 2017-08-19 MED ORDER — BUPIVACAINE-EPINEPHRINE (PF) 0.5% -1:200000 IJ SOLN
INTRAMUSCULAR | Status: AC
  Filled 2017-08-19: qty 30

## 2017-08-19 MED ORDER — TRAZODONE HCL 50 MG PO TABS
50.0000 mg | ORAL_TABLET | Freq: Every day | ORAL | Status: DC
  Administered 2017-08-20 – 2017-08-21 (×2): 50 mg via ORAL
  Filled 2017-08-19 (×2): qty 1

## 2017-08-19 MED ORDER — BUPIVACAINE-EPINEPHRINE (PF) 0.25% -1:200000 IJ SOLN
INTRAMUSCULAR | Status: AC
  Filled 2017-08-19: qty 30

## 2017-08-19 MED ORDER — METOCLOPRAMIDE HCL 5 MG PO TABS: 5.0000 mg | ORAL_TABLET | Freq: Three times a day (TID) | ORAL | Status: DC | PRN

## 2017-08-19 MED ORDER — DEXAMETHASONE SODIUM PHOSPHATE 10 MG/ML IJ SOLN
10.0000 mg | Freq: Once | INTRAMUSCULAR | Status: AC
  Administered 2017-08-20: 10 mg via INTRAVENOUS
  Filled 2017-08-19: qty 1

## 2017-08-19 MED ORDER — METHOCARBAMOL 500 MG PO TABS
500.0000 mg | ORAL_TABLET | Freq: Four times a day (QID) | ORAL | Status: DC | PRN
  Administered 2017-08-19 – 2017-08-22 (×6): 500 mg via ORAL
  Filled 2017-08-19 (×7): qty 1

## 2017-08-19 MED ORDER — MEPERIDINE HCL 25 MG/ML IJ SOLN: 6.2500 mg | INTRAMUSCULAR | Status: DC | PRN

## 2017-08-19 MED ORDER — PHENOL 1.4 % MT LIQD: 1.0000 | OROMUCOSAL | Status: DC | PRN

## 2017-08-19 MED ORDER — SODIUM CHLORIDE 0.9 % IR SOLN
Status: DC | PRN
  Administered 2017-08-19: 1000 mL

## 2017-08-19 MED ORDER — DOCUSATE SODIUM 100 MG PO CAPS
100.0000 mg | ORAL_CAPSULE | Freq: Two times a day (BID) | ORAL | Status: DC
  Administered 2017-08-19 – 2017-08-22 (×7): 100 mg via ORAL
  Filled 2017-08-19 (×7): qty 1

## 2017-08-19 MED ORDER — SODIUM CHLORIDE 0.9 % IJ SOLN
INTRAMUSCULAR | Status: DC | PRN
  Administered 2017-08-19: 20 mL

## 2017-08-19 MED ORDER — OXCARBAZEPINE 300 MG PO TABS
300.0000 mg | ORAL_TABLET | Freq: Two times a day (BID) | ORAL | Status: DC
  Administered 2017-08-19 – 2017-08-22 (×6): 300 mg via ORAL
  Filled 2017-08-19 (×6): qty 1

## 2017-08-19 MED ORDER — DEXTROSE 5 % IV SOLN
500.0000 mg | Freq: Four times a day (QID) | INTRAVENOUS | Status: DC | PRN
  Filled 2017-08-19: qty 5

## 2017-08-19 MED ORDER — PROPOFOL 10 MG/ML IV BOLUS
INTRAVENOUS | Status: AC
  Filled 2017-08-19: qty 20

## 2017-08-19 MED ORDER — TRANEXAMIC ACID 1000 MG/10ML IV SOLN
1000.0000 mg | Freq: Once | INTRAVENOUS | Status: AC
  Administered 2017-08-19: 1000 mg via INTRAVENOUS
  Filled 2017-08-19: qty 10

## 2017-08-19 MED ORDER — SENNOSIDES-DOCUSATE SODIUM 8.6-50 MG PO TABS: 1.0000 | ORAL_TABLET | Freq: Every evening | ORAL | Status: DC | PRN

## 2017-08-19 MED ORDER — MIDAZOLAM HCL 5 MG/5ML IJ SOLN
INTRAMUSCULAR | Status: DC | PRN
  Administered 2017-08-19 (×2): 1 mg via INTRAVENOUS

## 2017-08-19 MED ORDER — METOCLOPRAMIDE HCL 5 MG/ML IJ SOLN: 5.0000 mg | Freq: Three times a day (TID) | INTRAMUSCULAR | Status: DC | PRN

## 2017-08-19 MED ORDER — ACETAMINOPHEN 650 MG RE SUPP: 650.0000 mg | RECTAL | Status: DC | PRN

## 2017-08-19 MED ORDER — CHLORHEXIDINE GLUCONATE 4 % EX LIQD: 60.0000 mL | Freq: Once | CUTANEOUS | Status: DC

## 2017-08-19 MED ORDER — DULOXETINE HCL 30 MG PO CPEP
30.0000 mg | ORAL_CAPSULE | Freq: Two times a day (BID) | ORAL | Status: DC
  Administered 2017-08-19 – 2017-08-22 (×7): 30 mg via ORAL
  Filled 2017-08-19 (×7): qty 1

## 2017-08-19 MED ORDER — FENTANYL CITRATE (PF) 100 MCG/2ML IJ SOLN
INTRAMUSCULAR | Status: AC
  Filled 2017-08-19: qty 2

## 2017-08-19 MED ORDER — BISACODYL 5 MG PO TBEC: 5.0000 mg | DELAYED_RELEASE_TABLET | Freq: Every day | ORAL | Status: DC | PRN

## 2017-08-19 MED ORDER — FENTANYL CITRATE (PF) 250 MCG/5ML IJ SOLN
INTRAMUSCULAR | Status: AC
  Filled 2017-08-19: qty 5

## 2017-08-19 MED ORDER — ASPIRIN EC 325 MG PO TBEC
325.0000 mg | DELAYED_RELEASE_TABLET | Freq: Two times a day (BID) | ORAL | Status: DC
  Administered 2017-08-19 – 2017-08-22 (×7): 325 mg via ORAL
  Filled 2017-08-19 (×7): qty 1

## 2017-08-19 MED ORDER — ATENOLOL 50 MG PO TABS
50.0000 mg | ORAL_TABLET | Freq: Every day | ORAL | Status: DC
  Administered 2017-08-20 – 2017-08-22 (×3): 50 mg via ORAL
  Filled 2017-08-19 (×3): qty 1

## 2017-08-19 MED ORDER — FLEET ENEMA 7-19 GM/118ML RE ENEM: 1.0000 | ENEMA | Freq: Once | RECTAL | Status: DC | PRN

## 2017-08-19 MED ORDER — ZOLPIDEM TARTRATE 5 MG PO TABS
5.0000 mg | ORAL_TABLET | Freq: Every evening | ORAL | Status: DC | PRN
  Administered 2017-08-19: 5 mg via ORAL
  Filled 2017-08-19: qty 1

## 2017-08-19 MED ORDER — ONDANSETRON HCL 4 MG/2ML IJ SOLN: 4.0000 mg | Freq: Four times a day (QID) | INTRAMUSCULAR | Status: DC | PRN

## 2017-08-19 MED ORDER — PHENYLEPHRINE HCL 10 MG/ML IJ SOLN
INTRAMUSCULAR | Status: DC | PRN
  Administered 2017-08-19: 80 ug via INTRAVENOUS

## 2017-08-19 MED ORDER — ONDANSETRON HCL 4 MG/2ML IJ SOLN: 4.0000 mg | Freq: Once | INTRAMUSCULAR | Status: DC | PRN

## 2017-08-19 MED ORDER — TRAMADOL HCL 50 MG PO TABS
50.0000 mg | ORAL_TABLET | Freq: Four times a day (QID) | ORAL | Status: DC
  Administered 2017-08-19 – 2017-08-22 (×12): 100 mg via ORAL
  Filled 2017-08-19 (×12): qty 2

## 2017-08-19 MED ORDER — PROPOFOL 500 MG/50ML IV EMUL
INTRAVENOUS | Status: DC | PRN
  Administered 2017-08-19: 50 ug/kg/min via INTRAVENOUS

## 2017-08-19 MED ORDER — DIPHENHYDRAMINE HCL 12.5 MG/5ML PO ELIX: 12.5000 mg | ORAL_SOLUTION | ORAL | Status: DC | PRN

## 2017-08-19 SURGICAL SUPPLY — 63 items
BANDAGE ACE 6X5 VEL STRL LF (GAUZE/BANDAGES/DRESSINGS) ×3 IMPLANT
BANDAGE ESMARK 6X9 LF (GAUZE/BANDAGES/DRESSINGS) ×1 IMPLANT
BLADE SAGITTAL 13X1.27X60 (BLADE) ×2 IMPLANT
BLADE SAGITTAL 13X1.27X60MM (BLADE) ×1
BLADE SAW SGTL 83.5X18.5 (BLADE) ×3 IMPLANT
BLADE SURG 10 STRL SS (BLADE) ×3 IMPLANT
BNDG ESMARK 6X9 LF (GAUZE/BANDAGES/DRESSINGS) ×3
BOWL SMART MIX CTS (DISPOSABLE) ×3 IMPLANT
CAPT KNEE TOTAL 3 ×3 IMPLANT
CEMENT BONE SIMPLEX SPEEDSET (Cement) ×6 IMPLANT
CLOSURE STERI-STRIP 1/2X4 (GAUZE/BANDAGES/DRESSINGS) ×1
CLOSURE WOUND 1/2 X4 (GAUZE/BANDAGES/DRESSINGS) ×1
CLSR STERI-STRIP ANTIMIC 1/2X4 (GAUZE/BANDAGES/DRESSINGS) ×2 IMPLANT
COVER SURGICAL LIGHT HANDLE (MISCELLANEOUS) ×3 IMPLANT
CUFF TOURNIQUET SINGLE 34IN LL (TOURNIQUET CUFF) ×3 IMPLANT
DRAPE EXTREMITY T 121X128X90 (DRAPE) ×3 IMPLANT
DRAPE HALF SHEET 40X57 (DRAPES) ×3 IMPLANT
DRAPE INCISE IOBAN 66X45 STRL (DRAPES) ×6 IMPLANT
DRAPE U-SHAPE 47X51 STRL (DRAPES) ×3 IMPLANT
DRSG AQUACEL AG ADV 3.5X10 (GAUZE/BANDAGES/DRESSINGS) ×3 IMPLANT
DURAPREP 26ML APPLICATOR (WOUND CARE) ×6 IMPLANT
ELECT REM PT RETURN 9FT ADLT (ELECTROSURGICAL) ×3
ELECTRODE REM PT RTRN 9FT ADLT (ELECTROSURGICAL) ×1 IMPLANT
FILTER STRAW FLUID ASPIR (MISCELLANEOUS) IMPLANT
GLOVE BIOGEL M 7.0 STRL (GLOVE) IMPLANT
GLOVE BIOGEL PI IND STRL 7.5 (GLOVE) IMPLANT
GLOVE BIOGEL PI IND STRL 8.5 (GLOVE) ×1 IMPLANT
GLOVE BIOGEL PI INDICATOR 7.5 (GLOVE)
GLOVE BIOGEL PI INDICATOR 8.5 (GLOVE) ×2
GLOVE SURG ORTHO 8.0 STRL STRW (GLOVE) ×6 IMPLANT
GOWN STRL REUS W/ TWL LRG LVL3 (GOWN DISPOSABLE) ×1 IMPLANT
GOWN STRL REUS W/ TWL XL LVL3 (GOWN DISPOSABLE) ×2 IMPLANT
GOWN STRL REUS W/TWL 2XL LVL3 (GOWN DISPOSABLE) ×3 IMPLANT
GOWN STRL REUS W/TWL LRG LVL3 (GOWN DISPOSABLE) ×2
GOWN STRL REUS W/TWL XL LVL3 (GOWN DISPOSABLE) ×4
HANDPIECE INTERPULSE COAX TIP (DISPOSABLE) ×2
HOOD PEEL AWAY FACE SHEILD DIS (HOOD) ×9 IMPLANT
KIT BASIN OR (CUSTOM PROCEDURE TRAY) ×3 IMPLANT
KIT ROOM TURNOVER OR (KITS) ×3 IMPLANT
KNEE CAPITATED TOTAL 3 ×1 IMPLANT
MANIFOLD NEPTUNE II (INSTRUMENTS) ×3 IMPLANT
NEEDLE 18GX1X1/2 (RX/OR ONLY) (NEEDLE) IMPLANT
NEEDLE 22X1 1/2 (OR ONLY) (NEEDLE) ×6 IMPLANT
NS IRRIG 1000ML POUR BTL (IV SOLUTION) ×3 IMPLANT
PACK TOTAL JOINT (CUSTOM PROCEDURE TRAY) ×3 IMPLANT
PAD ARMBOARD 7.5X6 YLW CONV (MISCELLANEOUS) ×6 IMPLANT
SET HNDPC FAN SPRY TIP SCT (DISPOSABLE) ×1 IMPLANT
STRIP CLOSURE SKIN 1/2X4 (GAUZE/BANDAGES/DRESSINGS) ×2 IMPLANT
SUCTION FRAZIER HANDLE 10FR (MISCELLANEOUS)
SUCTION TUBE FRAZIER 10FR DISP (MISCELLANEOUS) IMPLANT
SUT MNCRL AB 3-0 PS2 18 (SUTURE) ×3 IMPLANT
SUT VIC AB 0 CTB1 27 (SUTURE) ×6 IMPLANT
SUT VIC AB 1 CT1 27 (SUTURE) ×8
SUT VIC AB 1 CT1 27XBRD ANBCTR (SUTURE) ×4 IMPLANT
SUT VIC AB 2-0 CT1 27 (SUTURE) ×4
SUT VIC AB 2-0 CT1 TAPERPNT 27 (SUTURE) ×2 IMPLANT
SYR 20CC LL (SYRINGE) ×6 IMPLANT
SYR TB 1ML LUER SLIP (SYRINGE) IMPLANT
TOWEL OR 17X24 6PK STRL BLUE (TOWEL DISPOSABLE) ×3 IMPLANT
TOWEL OR 17X26 10 PK STRL BLUE (TOWEL DISPOSABLE) ×3 IMPLANT
TRAY CATH 16FR W/PLASTIC CATH (SET/KITS/TRAYS/PACK) IMPLANT
WRAP KNEE MAXI GEL POST OP (GAUZE/BANDAGES/DRESSINGS) ×3 IMPLANT
YANKAUER SUCT BULB TIP NO VENT (SUCTIONS) ×3 IMPLANT

## 2017-08-19 NOTE — Transfer of Care (Signed)
Immediate Anesthesia Transfer of Care Note  Patient: Cynthia Richardson  Procedure(s) Performed: TOTAL KNEE ARTHROPLASTY (Left Knee)  Patient Location: PACU  Anesthesia Type:MAC, Regional and Spinal  Level of Consciousness: awake, alert , oriented and sedated  Airway & Oxygen Therapy: Patient Spontanous Breathing and Patient connected to nasal cannula oxygen  Post-op Assessment: Report given to RN, Post -op Vital signs reviewed and stable and Patient moving all extremities  Post vital signs: Reviewed and stable  Last Vitals:  Vitals:   08/19/17 0653 08/19/17 1112  BP: (!) 171/95 (!) 84/52  Pulse: 91 81  Resp: 20 (!) 8  Temp: 36.9 C 36.4 C  SpO2: 96% 100%    Last Pain:  Vitals:   08/19/17 0749  PainSc: 8          Complications: No apparent anesthesia complications

## 2017-08-19 NOTE — Anesthesia Postprocedure Evaluation (Signed)
Anesthesia Post Note  Patient: Cynthia Richardson  Procedure(s) Performed: TOTAL KNEE ARTHROPLASTY (Left Knee)     Patient location during evaluation: PACU Anesthesia Type: Regional and Spinal Level of consciousness: awake, awake and alert and oriented Pain management: pain level controlled Vital Signs Assessment: post-procedure vital signs reviewed and stable Respiratory status: spontaneous breathing, nonlabored ventilation and respiratory function stable Cardiovascular status: blood pressure returned to baseline Postop Assessment: spinal receding    Last Vitals:  Vitals:   08/19/17 1445 08/19/17 1455  BP:  139/79  Pulse: (!) 104 (!) 101  Resp: 18 16  Temp: 36.7 C 36.7 C  SpO2: 97% 97%    Last Pain:  Vitals:   08/19/17 1457  TempSrc:   PainSc: 2                  Ahsan Esterline COKER

## 2017-08-19 NOTE — Progress Notes (Signed)
Orthopedic Tech Progress Note Patient Details:  Cynthia Richardson 1966-09-21 [id number]  CPM Left Knee CPM Left Knee: On Left Knee Flexion (Degrees): 90 Left Knee Extension (Degrees): 0 Additional Comments: foot roll   [name] 08/19/2017, 1:16 PM

## 2017-08-19 NOTE — Anesthesia Procedure Notes (Signed)
Anesthesia Regional Block: Adductor canal block   Pre-Anesthetic Checklist: ,, timeout performed, Correct Patient, Correct Site, Correct Laterality, Correct Procedure, Correct Position, site marked, Risks and benefits discussed,  Surgical consent,  Pre-op evaluation,  At surgeon's request and post-op pain management  Laterality: Left  Prep: chloraprep       Needles:  Injection technique: Single-shot  Needle Type: Echogenic Stimulator Needle     Needle Length: 9cm  Needle Gauge: 22     Additional Needles:   Procedures:,,,, ultrasound used (permanent image in chart),,,,  Narrative:  Start time: 08/19/2017 10:50 AM End time: 08/19/2017 11:00 AM Injection made incrementally with aspirations every 5 mL.  Performed by: Personally  Anesthesiologist: Tirsa Gail  Additional Notes: 20 cc 0.75% Naropin injected easily

## 2017-08-19 NOTE — H&P (Signed)
TOTAL KNEE ADMISSION H&P  Patient is being admitted for left total knee arthroplasty.  Subjective:  Chief Complaint:left knee pain.  HPI: Cynthia Richardson, 51 y.o. female, has a history of pain and functional disability in the left knee due to arthritis and has failed non-surgical conservative treatments for greater than 12 weeks to includeNSAID's and/or analgesics, supervised PT with diminished ADL's post treatment and activity modification.  Onset of symptoms was gradual, starting 2 years ago with gradually worsening course since that time. The patient noted no past surgery on the left knee(s).  Patient currently rates pain in the left knee(s) at 8 out of 10 with activity. Patient has night pain, worsening of pain with activity and weight bearing, pain that interferes with activities of daily living and joint swelling.  Patient has evidence of periarticular osteophytes and joint space narrowing by imaging studies. This patient has had total right knee replacement 2018. There is no active infection.  Patient Active Problem List   Diagnosis Date Noted  . S/P total knee replacement 05/13/2017  . CEREBRAL EDEMA 01/19/2010  . CARDIOMYOPATHY 01/19/2010  . PULMONARY EDEMA 01/19/2010  . RESPIRATORY FAILURE 01/19/2010  . RENAL FAILURE 01/19/2010   Past Medical History:  Diagnosis Date  . Anxiety   . Arthritis   . Bipolar disorder (HCC)   . Bulging lumbar disc   . CHF (congestive heart failure) (HCC)    2011  . Chronic back pain   . Degenerative disc disease   . Depression   . Headache   . History of kidney stones   . Hypertension   . Hypertensive encephalopathy   . Nonischemic cardiomyopathy (HCC)    stress inducted (Takotsubo type) 12/2009 in the setting of unintentional drug OD with acute respiratory and renal failure; No CAD by 12/2009 cath; EF recovered (> 55% '15, '17)  . Osteoarthritis of knees, bilateral   . Pneumonia   . Respiratory failure [facility](HCC)    March 2011  . Seizures (HCC)      Past Surgical History:  Procedure Laterality Date  . ABDOMINAL HYSTERECTOMY    . APPENDECTOMY    . CHOLECYSTECTOMY    . JOINT REPLACEMENT    . TONSILLECTOMY    . TOTAL KNEE ARTHROPLASTY Right 05/13/2017   Procedure: RIGHT TOTAL KNEE ARTHROPLASTY;  Surgeon: Dannielle HuhLucey, Steve, MD;  Location: [facility] OR;  Service: Orthopedics;  Laterality: Right;    Current Facility-Administered Medications  Medication Dose Route Frequency Provider Last Rate Last Dose  . acetaminophen (TYLENOL) tablet 1,000 mg  1,000 mg Oral To Adele BarthelSS-Surg Lucey, Steve, MD      . bupivacaine liposome (EXPAREL) 1.3 % injection 266 mg  20 mL Infiltration To OR Dannielle HuhLucey, Steve, MD      . ceFAZolin (ANCEF) IVPB 2g/100 mL premix  2 g Intravenous To Adele BarthelSS-Surg Lucey, Steve, MD      . chlorhexidine (HIBICLENS) 4 % liquid 4 application  60 mL Topical Once Guy Sandiferobbins, Colby Alan, [place]      . chlorhexidine (HIBICLENS) 4 % liquid 4 application  60 mL Topical Once Guy Sandiferobbins, Colby Alan, [place]      . dexamethasone (DECADRON) injection 8 mg  8 mg Intravenous To Adele BarthelSS-Surg Lucey, Steve, MD      . fentaNYL (SUBLIMAZE) 100 MCG/2ML injection           . midazolam (VERSED) 2 MG/2ML injection           . tranexamic acid (CYKLOKAPRON) 1,000 mg in sodium chloride 0.9 % 100 mL IVPB  1,000 mg Intravenous To OR Dannielle Huh, MD       No Known Allergies  Social History  Substance Use Topics  . Smoking status: Never Smoker  . Smokeless tobacco: Never Used  . Alcohol use No    Family History  Problem Relation Age of Onset  . Emphysema Mother   . Hypertension Mother   . Liver disease Father      ROS  Objective:  Physical Exam  Vital signs in last 24 hours: Temp:  [98.4 F (36.9 C)] 98.4 F (36.9 C) (10/29 0653) Pulse Rate:  [91] 91 (10/29 0653) Resp:  [20] 20 (10/29 0653) BP: (171)/(95) 171/95 (10/29 0653) SpO2:  [96 %] 96 % (10/29 0653) Weight:  [254 lb (115.2 kg)] 254 lb (115.2 kg) (10/29 0653)  Labs:   Estimated body mass index is 42.27 kg/m as  calculated from the following:   Height as of 08/08/17: 5\' 5"  (1.651 m).   Weight as of this encounter: 254 lb (115.2 kg).   Imaging Review Plain radiographs demonstrate moderate degenerative joint disease of the left knee(s). The overall alignment ismild varus. The bone quality appears to be adequate for age and reported activity level.  Assessment/Plan:  End stage arthritis, left knee   The patient history, physical examination, clinical judgment of the provider and imaging studies are consistent with end stage degenerative joint disease of the left knee(s) and total knee arthroplasty is deemed medically necessary. The treatment options including medical management, injection therapy arthroscopy and arthroplasty were discussed at length. The risks and benefits of total knee arthroplasty were presented and reviewed. The risks due to aseptic loosening, infection, stiffness, patella tracking problems, thromboembolic complications and other imponderables were discussed. The patient acknowledged the explanation, agreed to proceed with the plan and consent was signed. Patient is being admitted for inpatient treatment for surgery, pain control, PT, OT, prophylactic antibiotics, VTE prophylaxis, progressive ambulation and ADL's and discharge planning. The patient is planning to be discharged home with home health services

## 2017-08-19 NOTE — Anesthesia Procedure Notes (Signed)
Spinal  Patient location during procedure: OR Staffing Anesthesiologist: Cristela Blue Spinal Block Patient position: sitting Prep: DuraPrep Patient monitoring: heart rate, blood pressure and continuous pulse ox Approach: right paramedian Location: L3-4 Injection technique: single-shot Needle Needle type: Sprotte  Needle gauge: 24 G Needle length: 9 cm Assessment Sensory level: T4 Additional Notes Spinal Dosage in OR  .75% Bupivicaine ml       1.8 LLD x 3 min

## 2017-08-19 NOTE — Anesthesia Procedure Notes (Signed)
Procedure Name: MAC Date/Time: 08/19/2017 9:00 AM Performed by: Scheryl Darter Pre-anesthesia Checklist: Patient identified, Emergency Drugs available, Suction available, Patient being monitored and Timeout performed Patient Re-evaluated:Patient Re-evaluated prior to induction Oxygen Delivery Method: Simple face mask Placement Confirmation: positive ETCO2

## 2017-08-19 NOTE — Evaluation (Signed)
Physical Therapy Evaluation Patient Details Name: Cynthia Richardson MRN: [id number] DOB: 06/05/1966 Today's Date: 08/19/2017   History of Present Illness  Pt is a 51 y/o female s/p elective L TKA. PMH includes anxiety, bipolar disorder, CHF, HTN, chronic back pain, seizures, nonischemic cardiomyopathy, and R TKA.   Clinical Impression  Pt is s/p surgery above with deficits below. PTA, pt was independent with mobility. Upon eval, pt very limited by pain and only able to tolerate stand pivot to chair. Required min A for mobility this session. Reports she will have friend staying with her at d/c and has all necessary DME. Pt reports she will be receiving HHPT at d/c. Feel pt will progress well once pain is controlled. Will continue to follow to increase independence and safety with functional mobility.     Follow Up Recommendations DC plan and follow up therapy as arranged by surgeon;Supervision for mobility/OOB    Equipment Recommendations  None recommended by PT    Recommendations for Other Services       Precautions / Restrictions Precautions Precautions: Knee Precaution Booklet Issued: Yes (comment) Precaution Comments: Reviewed supine ther ex. Pt with extremely limited tolerance.  Restrictions Weight Bearing Restrictions: Yes LLE Weight Bearing: Weight bearing as tolerated      Mobility  Bed Mobility Overal bed mobility: Needs Assistance Bed Mobility: Supine to Sit     Supine to sit: Min assist     General bed mobility comments: Min A for LLE management. Increased time required secondary to pain.   Transfers Overall transfer level: Needs assistance Equipment used: Rolling walker (2 wheeled) Transfers: Sit to/from [employer]GI CorporationStand;Stand Pivot Transfers Sit to Stand: Min assist Stand pivot transfers: Min assist       General transfer comment: Min A for lift assist and steadying. Verbal cues for hand placement. Min A for transfer to chair during stand pivot. Verbal cues for  sequencing with RW.   Ambulation/Gait             General Gait Details: Able to take a few steps towards chair during stand pivot, however, very limited  by pain.   Stairs            Wheelchair Mobility    Modified Rankin (Stroke Patients Only)       Balance Overall balance assessment: Needs assistance Sitting-balance support: No upper extremity supported;Feet supported Sitting balance-Leahy Scale: Fair     Standing balance support: Bilateral upper extremity supported;During functional activity Standing balance-Leahy Scale: Poor Standing balance comment: Reliant on UE support                              Pertinent Vitals/Pain Pain Assessment: 0-10 Pain Score: 9  Pain Location: L knee  Pain Descriptors / Indicators: Aching;Operative site guarding Pain Intervention(s): Limited activity within patient's tolerance;Monitored during session;Repositioned    Home Living Family/patient expects to be discharged to:: Private residence Living Arrangements: Non-relatives/Friends Available Help at Discharge: Friend(s);Available 24 hours/day Type of Home: Mobile home Home Access: Stairs to enter Entrance Stairs-Rails: Right;Left;Can reach both Entrance Stairs-Number of Steps: 4 Home Layout: One level Home Equipment: Cane - single point;Walker - 2 wheels;Bedside commode      Prior Function Level of Independence: Independent               Hand Dominance   Dominant Hand: Right    Extremity/Trunk Assessment   Upper Extremity Assessment Upper Extremity Assessment: Defer to OT evaluation  Lower Extremity Assessment Lower Extremity Assessment: LLE deficits/detail LLE Deficits / Details: Sensory in tact. Deficits consistent with post op pain and weakness. Very limited ther ex tolerance     Cervical / Trunk Assessment Cervical / Trunk Assessment: Normal  Communication   Communication: No difficulties  Cognition Arousal/Alertness:  Awake/alert Behavior During Therapy: WFL for tasks assessed/performed Overall Cognitive Status: Within Functional Limits for tasks assessed                                        General Comments      Exercises Total Joint Exercises Ankle Circles/Pumps: AROM;Both;20 reps Quad Sets: AROM;Left;10 reps Gluteal Sets: AROM;Both;5 reps   Assessment/Plan    PT Assessment Patient needs continued PT services  PT Problem List Decreased strength;Decreased activity tolerance;Decreased range of motion;Decreased balance;Decreased mobility;Decreased knowledge of use of DME;Pain       PT Treatment Interventions DME instruction;Gait training;Functional mobility training;Stair training;Therapeutic activities;Therapeutic exercise;Balance training;Neuromuscular re-education;Patient/family education    PT Goals (Current goals can be found in the Care Plan section)  Acute Rehab PT Goals Patient Stated Goal: to decrease pain  PT Goal Formulation: With patient Time For Goal Achievement: 08/26/17 Potential to Achieve Goals: Good    Frequency 7X/week   Barriers to discharge        Co-evaluation               AM-PAC PT "6 Clicks" Daily Activity  Outcome Measure Difficulty turning over in bed (including adjusting bedclothes, sheets and blankets)?: A Little Difficulty moving from lying on back to sitting on the side of the bed? : Unable Difficulty sitting down on and standing up from a chair with arms (e.g., wheelchair, bedside commode, etc,.)?: Unable Help needed moving to and from a bed to chair (including a wheelchair)?: A Little Help needed walking in hospital room?: A Little Help needed climbing 3-5 steps with a railing? : A Lot 6 Click Score: 13    End of Session Equipment Utilized During Treatment: Gait belt Activity Tolerance: Patient limited by pain Patient left: in chair;with call bell/phone within reach Nurse Communication: Mobility status PT Visit Diagnosis:  Other abnormalities of gait and mobility (R26.89);Pain Pain - Right/Left: Left Pain - part of body: Knee    Time: 1751-1810 PT Time Calculation (min) (ACUTE ONLY): 19 min   Charges:   PT Evaluation $PT Eval Low Complexity: 1 Low     PT G Codes:   PT G-Codes **NOT FOR INPATIENT CLASS** Functional Assessment Tool Used: AM-PAC 6 Clicks Basic Mobility;Clinical judgement Functional Limitation: Mobility: Walking and moving around Mobility: Walking and Moving Around Current Status (B1517): At least 40 percent but less than 60 percent impaired, limited or restricted Mobility: Walking and Moving Around Goal Status [phone]): At least 1 percent but less than 20 percent impaired, limited or restricted    Gladys Damme, PT, DPT  Acute Rehabilitation Services  Pager: [phone]   Lehman Prom 08/19/2017, 6:16 PM

## 2017-08-20 ENCOUNTER — Encounter (HOSPITAL_COMMUNITY): Payer: Self-pay | Admitting: Orthopedic Surgery

## 2017-08-20 DIAGNOSIS — Z8249 Family history of ischemic heart disease and other diseases of the circulatory system: Secondary | ICD-10-CM | POA: Diagnosis not present

## 2017-08-20 DIAGNOSIS — Z90710 Acquired absence of both cervix and uterus: Secondary | ICD-10-CM | POA: Diagnosis not present

## 2017-08-20 DIAGNOSIS — I110 Hypertensive heart disease with heart failure: Secondary | ICD-10-CM | POA: Diagnosis not present

## 2017-08-20 DIAGNOSIS — Z23 Encounter for immunization: Secondary | ICD-10-CM | POA: Diagnosis not present

## 2017-08-20 DIAGNOSIS — F319 Bipolar disorder, unspecified: Secondary | ICD-10-CM | POA: Diagnosis not present

## 2017-08-20 DIAGNOSIS — I252 Old myocardial infarction: Secondary | ICD-10-CM | POA: Diagnosis not present

## 2017-08-20 DIAGNOSIS — M1712 Unilateral primary osteoarthritis, left knee: Secondary | ICD-10-CM | POA: Diagnosis not present

## 2017-08-20 DIAGNOSIS — Z96651 Presence of right artificial knee joint: Secondary | ICD-10-CM | POA: Diagnosis not present

## 2017-08-20 DIAGNOSIS — I509 Heart failure, unspecified: Secondary | ICD-10-CM | POA: Diagnosis not present

## 2017-08-20 LAB — BASIC METABOLIC PANEL
ANION GAP: 5 (ref 5–15)
BUN: 9 mg/dL (ref 6–20)
CALCIUM: 8.7 mg/dL — AB (ref 8.9–10.3)
CO2: 27 mmol/L (ref 22–32)
CREATININE: 0.94 mg/dL (ref 0.44–1.00)
Chloride: 106 mmol/L (ref 101–111)
GFR calc Af Amer: >60 mL/min (ref >60)
Glucose, Bld: 123 mg/dL — ABNORMAL HIGH (ref 65–99)
Potassium: 4.8 mmol/L (ref 3.5–5.1)
SODIUM: 138 mmol/L (ref 135–145)

## 2017-08-20 LAB — CBC
HEMATOCRIT: 35.5 % — AB (ref 36.0–46.0)
Hemoglobin: 11.4 g/dL — ABNORMAL LOW (ref 12.0–15.0)
MCH: 29.3 pg (ref 26.0–34.0)
MCHC: 32.1 g/dL (ref 30.0–36.0)
MCV: 91.3 fL (ref 78.0–100.0)
Platelets: 243 10*3/uL (ref 150–400)
RBC: 3.89 MIL/uL (ref 3.87–5.11)
RDW: 12.8 % (ref 11.5–15.5)
WBC: 15.3 10*3/uL — AB (ref 4.0–10.5)

## 2017-08-20 MED ORDER — ASPIRIN 325 MG PO TBEC: 325.0000 mg | DELAYED_RELEASE_TABLET | Freq: Two times a day (BID) | ORAL | 0 refills | Status: DC

## 2017-08-20 MED ORDER — TRAMADOL HCL 50 MG PO TABS: 50.0000 mg | ORAL_TABLET | Freq: Four times a day (QID) | ORAL | 0 refills | Status: DC

## 2017-08-20 MED ORDER — HYDROCODONE-ACETAMINOPHEN 10-325 MG PO TABS: 1.0000 | ORAL_TABLET | ORAL | 0 refills | Status: DC | PRN

## 2017-08-20 MED ORDER — METHOCARBAMOL 500 MG PO TABS: 500.0000 mg | ORAL_TABLET | Freq: Four times a day (QID) | ORAL | 0 refills | Status: DC | PRN

## 2017-08-20 NOTE — Progress Notes (Signed)
Orthopedic Tech Progress Note Patient Details:  Cynthia Richardson 21-Dec-1965 [id number]  CPM Left Knee CPM Left Knee: On Left Knee Flexion (Degrees): 60 Left Knee Extension (Degrees): 0 Additional Comments: foot roll   [name] 08/20/2017, 2:05 PM

## 2017-08-20 NOTE — Evaluation (Signed)
Occupational Therapy Evaluation Patient Details Name: Cynthia Richardson MRN: [id number] DOB: 01-12-1966 Today's Date: 08/20/2017    History of Present Illness Pt is a 51 y/o female s/p elective L TKA. PMH includes anxiety, bipolar disorder, CHF, HTN, chronic back pain, seizures, nonischemic cardiomyopathy, and R TKA.    Clinical Impression   PTA, pt was independent with ADL and functional mobility. She currently requires mod assist for LB ADL, min guard assist for toilet transfers, and min guard assist for standing grooming tasks. She was limited by pain this session and unable to progress with tub transfers although attempted. Pt would benefit from continued OT services while admitted to improve independence with ADL and functional mobility prior to returning home with 24 hour assistance.     Follow Up Recommendations  No OT follow up;Supervision/Assistance - 24 hour    Equipment Recommendations  None recommended by OT    Recommendations for Other Services       Precautions / Restrictions Precautions Precautions: Knee Precaution Booklet Issued: No Precaution Comments: Reviewed knee precautions during ADL.  Restrictions Weight Bearing Restrictions: Yes LLE Weight Bearing: Weight bearing as tolerated      Mobility Bed Mobility Overal bed mobility: Needs Assistance Bed Mobility: Supine to Sit     Supine to sit: Supervision     General bed mobility comments: OOB in chair on arrival  Transfers Overall transfer level: Needs assistance Equipment used: Rolling walker (2 wheeled) Transfers: Sit to/from Stand Sit to Stand: Min guard Stand pivot transfers: Min guard       General transfer comment: Min guard assist to rise to standing.     Balance Overall balance assessment: Needs assistance Sitting-balance support: No upper extremity supported;Feet supported Sitting balance-Leahy Scale: Fair     Standing balance support: Bilateral upper extremity supported;During  functional activity Standing balance-Leahy Scale: Poor Standing balance comment: Reliant on UE support from RW                           ADL either performed or assessed with clinical judgement   ADL Overall ADL's : Needs assistance/impaired Eating/Feeding: Set up;Sitting   Grooming: Min guard;Standing   Upper Body Bathing: Set up;Sitting   Lower Body Bathing: Sit to/from stand;Moderate assistance   Upper Body Dressing : Set up;Sitting   Lower Body Dressing: Sit to/from stand;Moderate assistance   Toilet Transfer: Min guard;Ambulation;RW   Toileting- Architect and Hygiene: Min guard;Sit to/from stand       Functional mobility during ADLs: Min guard;Rolling walker General ADL Comments: Attempted tub transfer but unable to progress due to pain.      Vision Patient Visual Report: No change from baseline Vision Assessment?: No apparent visual deficits     Perception     Praxis      Pertinent Vitals/Pain Pain Assessment: 0-10 Pain Score: 10-Worst pain ever Pain Location: L knee  Pain Descriptors / Indicators: Aching;Operative site guarding Pain Intervention(s): Monitored during session;Repositioned;Ice applied;Patient requesting pain meds-RN notified     Hand Dominance Right   Extremity/Trunk Assessment Upper Extremity Assessment Upper Extremity Assessment: Overall WFL for tasks assessed   Lower Extremity Assessment Lower Extremity Assessment: LLE deficits/detail LLE Deficits / Details: Decreased strength and ROM as expected post-operatively.    Cervical / Trunk Assessment Cervical / Trunk Assessment: Normal   Communication Communication Communication: No difficulties   Cognition Arousal/Alertness: Awake/alert Behavior During Therapy: WFL for tasks assessed/performed Overall Cognitive Status: Within Functional Limits for tasks  assessed                                     General Comments       Exercises Total  Joint Exercises Ankle Circles/Pumps: AROM;Both;20 reps;10 reps Quad Sets: AROM;Left;10 reps Short Arc Quad: AROM;10 reps;Supine Hip ABduction/ADduction: AROM;Left;10 reps;Supine   Shoulder Instructions      Home Living Family/patient expects to be discharged to:: Private residence Living Arrangements: Non-relatives/Friends Available Help at Discharge: Friend(s);Available 24 hours/day Type of Home: Mobile home Home Access: Stairs to enter Entrance Stairs-Number of Steps: 4 Entrance Stairs-Rails: Right;Left;Can reach both Home Layout: One level     Bathroom Shower/Tub: Chief Strategy Officer: Standard     Home Equipment: Cane - single point;Walker - 2 wheels;Bedside commode          Prior Functioning/Environment Level of Independence: Independent                 OT Problem List: Impaired balance (sitting and/or standing);Impaired vision/perception;Decreased safety awareness;Decreased knowledge of use of DME or AE;Decreased knowledge of precautions;Pain      OT Treatment/Interventions: Self-care/ADL training;Therapeutic exercise;Energy conservation;DME and/or AE instruction;Therapeutic activities;Patient/family education;Balance training    OT Goals(Current goals can be found in the care plan section) Acute Rehab OT Goals Patient Stated Goal: to decrease pain  OT Goal Formulation: With patient Time For Goal Achievement: 09/03/17 Potential to Achieve Goals: Good ADL Goals Pt Will Perform Grooming: standing;with modified independence Pt Will Perform Lower Body Bathing: with supervision;sit to/from stand Pt Will Perform Lower Body Dressing: with supervision;sit to/from stand Pt Will Transfer to Toilet: with modified independence;ambulating;bedside commode Pt Will Perform Toileting - Clothing Manipulation and hygiene: with modified independence;sit to/from stand Pt Will Perform Tub/Shower Transfer: Tub transfer;with min guard assist;ambulating;3 in 1;rolling  walker  OT Frequency: Min 2X/week   Barriers to D/C:            Co-evaluation              AM-PAC PT "6 Clicks" Daily Activity     Outcome Measure Help from another person eating meals?: None Help from another person taking care of personal grooming?: A Little Help from another person toileting, which includes using toliet, bedpan, or urinal?: A Little Help from another person bathing (including washing, rinsing, drying)?: A Little Help from another person to put on and taking off regular upper body clothing?: None Help from another person to put on and taking off regular lower body clothing?: A Little 6 Click Score: 20   End of Session Equipment Utilized During Treatment: Gait belt;Rolling walker Nurse Communication: Mobility status  Activity Tolerance: Patient tolerated treatment well Patient left: in chair;with call bell/phone within reach  OT Visit Diagnosis: Other abnormalities of gait and mobility (R26.89);Pain Pain - Right/Left: Left Pain - part of body: Knee                Time: 1102-1117 OT Time Calculation (min): 24 min Charges:  OT General Charges $OT Visit: 1 Visit OT Evaluation $OT Eval Moderate Complexity: 1 Mod OT Treatments $Self Care/Home Management : 8-22 mins G-Codes: OT G-codes **NOT FOR INPATIENT CLASS** Functional Assessment Tool Used: Clinical judgement Functional Limitation: Self care Self Care Current Status (B5670): At least 20 percent but less than 40 percent impaired, limited or restricted Self Care Goal Status (L4103): At least 1 percent but less than 20 percent impaired, limited or  restricted   Doristine Sectionharity A Cristiana Yochim, MS OTR/L  Pager: [phone]   Lorene Klimas A Suriyah Vergara 08/20/2017, 2:01 PM

## 2017-08-20 NOTE — Progress Notes (Signed)
SPORTS MEDICINE AND JOINT REPLACEMENT  Georgena Spurling, MD    Laurier Nancy, PA-C [address] [place], [place], [place]  [address]                             [phone]   PROGRESS NOTE  Subjective:  negative for Chest Pain  negative for Shortness of Breath  negative for Nausea/Vomiting   negative for Calf Pain  negative for Bowel Movement   Tolerating Diet: yes         Patient reports pain as 4 on 0-10 scale.    Objective: Vital signs in last 24 hours:   Patient Vitals for the past 24 hrs:  BP Temp Temp src Pulse Resp SpO2  08/20/17 0441 114/73 98.1 F (36.7 C) Oral 83 16 93 %  08/20/17 0018 (!) 149/87 98.2 F (36.8 C) Oral 99 16 93 %  08/19/17 2050 120/73 98.4 F (36.9 C) Oral (!) 111 16 92 %  08/19/17 1455 139/79 98 F (36.7 C) Oral (!) 101 16 97 %  08/19/17 1445 - 98.1 F (36.7 C) - (!) 104 18 97 %  08/19/17 1430 - - - 99 (!) 25 96 %  08/19/17 1420 - 98.2 F (36.8 C) - - - -  08/19/17 1415 - - - 99 17 97 %  08/19/17 1410 123/76 - - 96 16 100 %  08/19/17 1400 - - - 89 13 97 %  08/19/17 1355 138/86 - - 83 14 97 %  08/19/17 1345 - - - 94 (!) 23 95 %  08/19/17 1330 - 98.1 F (36.7 C) - 84 (!) 21 97 %  08/19/17 1325 120/75 - - 81 11 96 %  08/19/17 1315 - - - 85 15 100 %  08/19/17 1310 105/73 - - 82 16 100 %  08/19/17 1300 - - - 83 15 100 %  08/19/17 1259 - - - 82 12 100 %  08/19/17 1255 114/86 - - 83 15 99 %  08/19/17 1245 - - - 85 15 98 %  08/19/17 1240 (!) 99/57 - - 68 10 99 %  08/19/17 1230 - - - 73 10 98 %  08/19/17 1225 (!) 100/51 - - 72 (!) 8 99 %  08/19/17 1215 - - - 73 (!) 9 99 %  08/19/17 1210 (!) 107/59 - - 75 (!) 9 99 %  08/19/17 1200 - - - 69 (!) 7 100 %  08/19/17 1155 108/64 - - 72 (!) 9 100 %  08/19/17 1145 - - - 72 (!) 8 100 %  08/19/17 1140 (!) 99/59 - - 72 (!) 8 100 %  08/19/17 1133 102/63 - - 73 (!) 9 98 %  08/19/17 1130 - - - 79 12 97 %  08/19/17 1125 (!) 84/61 - - 76 (!) 8 100 %  08/19/17 1118 (!) 99/59 - - 77 (!) 7 100 %  08/19/17  1115 - - - 79 12 100 %  08/19/17 1112 (!) 84/52 97.6 F (36.4 C) - 81 (!) 8 100 %    @flow {1959:LAST@   Intake/Output from previous day:   10/29 0701 - 10/30 0700 In: 2030 [P.O.:360; I.V.:1400] Out: 1975 [Urine:1950]   Intake/Output this shift:   No intake/output data recorded.   Intake/Output      10/29 0701 - 10/30 0700 10/30 0701 - 10/31 0700   P.O. 360    I.V. (mL/kg) 1400 (12.2)    IV  Piggyback 270    Total Intake(mL/kg) 2030 (17.6)    Urine (mL/kg/hr) 1950 (0.7)    Blood 25    Total Output 1975     Net +55             LABORATORY DATA:  Recent Labs  08/20/17 0548  WBC 15.3*  HGB 11.4*  HCT 35.5*  PLT 243   No results for input(s): NA, K, CL, CO2, BUN, CREATININE, GLUCOSE, CALCIUM in the last 168 hours. Lab Results  Component Value Date   INR 1.08 12/30/2009    Examination:  General appearance: alert, cooperative and no distress Extremities: extremities normal, atraumatic, no cyanosis or edema  Wound Exam: clean, dry, intact   Drainage:  None: wound tissue dry  Motor Exam: Quadriceps and Hamstrings Intact  Sensory Exam: Superficial Peroneal, Deep Peroneal and Tibial normal   Assessment:    1 Day Post-Op  Procedure(s) (LRB): TOTAL KNEE ARTHROPLASTY (Left)  ADDITIONAL DIAGNOSIS:  Active Problems:   S/P total knee replacement     Plan: Physical Therapy as ordered Weight Bearing as Tolerated (WBAT)  DVT Prophylaxis:  Aspirin  DISCHARGE PLAN: Home  DISCHARGE NEEDS: HHPT   Patient is doing well, still limited by pain. Will follow pt recommendations to determine if D/C if best for today vs tomorrow         Guy SandiferColby Alan Lizandra Zakrzewski 08/20/2017, 7:04 AM

## 2017-08-20 NOTE — Progress Notes (Signed)
Physical Therapy Treatment Patient Details Name: Cynthia Richardson MRN: [id number] DOB: 01/16/1966 Today's Date: 08/20/2017    History of Present Illness Pt is a 51 y/o female s/p elective L TKA. PMH includes anxiety, bipolar disorder, CHF, HTN, chronic back pain, seizures, nonischemic cardiomyopathy, and R TKA.     PT Comments    Pt motivated to get out bed for PT this AM.  Pt completed single leg stance on LLE emphasized push through L heel; slight knee buckling; guarded by SPTA. Pt reported symptoms of dizziness/lightheadeness towards end of gait activity, decreased after rest break.  Stated that she decided with her doctor to stay another day. Educated importance for proper LLE positioning, total knee precautions. Continue with gait training and LE strengthening next session.   Follow Up Recommendations  DC plan and follow up therapy as arranged by surgeon;Supervision for mobility/OOB     Equipment Recommendations  None recommended by PT    Recommendations for Other Services       Precautions / Restrictions Precautions Precautions: Knee Precaution Booklet Issued: No Precaution Comments: Reviewed knee precautions Restrictions Weight Bearing Restrictions: Yes LLE Weight Bearing: Weight bearing as tolerated    Mobility  Bed Mobility               General bed mobility comments: supervision for safety  Transfers Overall transfer level: Needs assistance Equipment used: Rolling walker (2 wheeled) Transfers: Sit to/from Stand Sit to Stand: Min guard Stand pivot transfers: Min guard       General transfer comment: Min guard assist for proper hand placement  Ambulation/Gait Ambulation/Gait assistance: Min guard   Assistive device: Rolling walker (2 wheeled) Gait Pattern/deviations: Step-to pattern;Decreased step length - right;Decreased stance time - left;Antalgic;Trunk flexed     General Gait Details: VC needed for proper gait sequencing and posture during gait  training. rest breaks x2.   Stairs            Wheelchair Mobility    Modified Rankin (Stroke Patients Only)       Balance Overall balance assessment: Needs assistance Sitting-balance support: No upper extremity supported;Feet supported Sitting balance-Leahy Scale: Fair     Standing balance support: Bilateral upper extremity supported;During functional activity Standing balance-Leahy Scale: Poor Standing balance comment: Reliant on UE support from RW                            Cognition Arousal/Alertness: Awake/alert Behavior During Therapy: WFL for tasks assessed/performed Overall Cognitive Status: Within Functional Limits for tasks assessed                                        Exercises Total Joint Exercises Ankle Circles/Pumps: AROM;Both;20 reps;10 reps Quad Sets: AROM;Left;10 reps Gluteal Sets: AROM;Both;5 reps Short Arc Quad: AROM;10 reps;Supine Hip ABduction/ADduction: AROM;Left;10 reps;Supine    General Comments        Pertinent Vitals/Pain Pain Assessment: 0-10 Pain Score: 6  Pain Location: L knee  Pain Descriptors / Indicators: Aching;Operative site guarding Pain Intervention(s): Monitored during session;Repositioned;Ice applied;Premedicated before session    Home Living Family/patient expects to be discharged to:: Private residence Living Arrangements: Non-relatives/Friends Available Help at Discharge: Friend(s);Available 24 hours/day Type of Home: Mobile home Home Access: Stairs to enter Entrance Stairs-Rails: Right;Left;Can reach both Home Layout: One level Home Equipment: Cane - single point;Walker - 2 wheels;Bedside commode  Prior Function Level of Independence: Independent          PT Goals (current goals can now be found in the care plan section) Acute Rehab PT Goals Patient Stated Goal: to decrease pain     Frequency    7X/week      PT Plan Current plan remains appropriate     Co-evaluation              AM-PAC PT "6 Clicks" Daily Activity  Outcome Measure  Difficulty turning over in bed (including adjusting bedclothes, sheets and blankets)?: A Little Difficulty moving from lying on back to sitting on the side of the bed? : A Lot Difficulty sitting down on and standing up from a chair with arms (e.g., wheelchair, bedside commode, etc,.)?: Unable Help needed moving to and from a bed to chair (including a wheelchair)?: A Little Help needed walking in hospital room?: A Little Help needed climbing 3-5 steps with a railing? : A Lot 6 Click Score: 14    End of Session Equipment Utilized During Treatment: Gait belt Activity Tolerance: Patient limited by pain Patient left: in chair;with call bell/phone within reach Nurse Communication: Mobility status PT Visit Diagnosis: Other abnormalities of gait and mobility (R26.89);Pain Pain - Right/Left: Left Pain - part of body: Knee     Time:  -     Charges:                       G Codes:       Forde Radon, SPTA    Forde Radon 08/20/2017, 3:33 PM

## 2017-08-20 NOTE — Care Management Note (Signed)
Case Management Note  Patient Details  Name: Cynthia Richardson MRN: [id number] Date of Birth: 07-19-1966  Subjective/Objective:               Pt presented for Knee replacement.  Pt at home with friend who can provide care 24/7.  Pt has RW and 3N1.  CPM will be delivered by [employer].  Pt aware of arrangements made by orthopedic office for [facility] to provide PT.     Action/Plan: No further CM needs identified at this time.  Expected Discharge Date:                  Expected Discharge Plan:  Home w Home Health Services  In-House Referral:  NA  Discharge planning Services  CM Consult  Post Acute Care Choice:  Durable Medical Equipment, Home Health Choice offered to:  Patient  DME Arranged:  Continuous passive motion machine DME Agency:  [employer]  HH Arranged:  PT HH Agency:  [facility] ([facility]  Status of Service:  Completed, signed off  If discussed at [employer], dates discussed:    Additional Comments:  Verdene Lennert, RN 08/20/2017, 12:12 PM

## 2017-08-20 NOTE — Care Management Obs Status (Signed)
MEDICARE OBSERVATION STATUS NOTIFICATION   Patient Details  Name: Cynthia Richardson MRN: [id number] Date of Birth: 09/26/1966   Medicare Observation Status Notification Given:  Yes    Verdene Lennert, RN 08/20/2017, 12:11 PM

## 2017-08-20 NOTE — Progress Notes (Signed)
Physical Therapy Treatment Patient Details Name: Cynthia Richardson MRN: [id number] DOB: 1966/10/20 Today's Date: 08/20/2017    History of Present Illness Pt is a 51 y/o female s/p elective L TKA. PMH includes anxiety, bipolar disorder, CHF, HTN, chronic back pain, seizures, nonischemic cardiomyopathy, and R TKA.     PT Comments    Pt in much more pain this afternoon but willing to ambulate to restroom. Min guard for safety. Pt reports a lot of pain during session but was able to push through all LE strengthening exercises. Pt showed improvements from this morning despite pain. Work on Museum/gallery curator and continue with LE strengthening next session. I anticipate she will be able to discharge home tomorrow. Would like 2 PT sessions.   Follow Up Recommendations  DC plan and follow up therapy as arranged by surgeon;Supervision for mobility/OOB     Equipment Recommendations  None recommended by PT    Recommendations for Other Services       Precautions / Restrictions Precautions Precautions: Knee Precaution Booklet Issued: No Precaution Comments: Reviewed knee precautions Restrictions Weight Bearing Restrictions: Yes LLE Weight Bearing: Weight bearing as tolerated    Mobility  Bed Mobility Overal bed mobility: Needs Assistance Bed Mobility: Supine to Sit     Supine to sit: Supervision     General bed mobility comments: supervision for safety  Transfers Overall transfer level: Needs assistance Equipment used: Rolling walker (2 wheeled) Transfers: Sit to/from Stand Sit to Stand: Min guard Stand pivot transfers: Min guard       General transfer comment: Min guard assist for proper hand placement  Ambulation/Gait Ambulation/Gait assistance: Min guard Ambulation Distance (Feet): 20 Feet Assistive device: Rolling walker (2 wheeled) Gait Pattern/deviations: Step-to pattern;Decreased step length - right;Decreased stance time - left;Antalgic;Trunk flexed     General Gait  Details: min guard for safety minimal knee buckling but pt was able to support herself on the walker safely    Stairs            Wheelchair Mobility    Modified Rankin (Stroke Patients Only)       Balance Overall balance assessment: Needs assistance Sitting-balance support: No upper extremity supported;Feet supported Sitting balance-Leahy Scale: Fair     Standing balance support: Bilateral upper extremity supported;During functional activity Standing balance-Leahy Scale: Poor Standing balance comment: Reliant on UE support from RW                            Cognition Arousal/Alertness: Awake/alert Behavior During Therapy: WFL for tasks assessed/performed Overall Cognitive Status: Within Functional Limits for tasks assessed                                        Exercises Total Joint Exercises Ankle Circles/Pumps: AROM;Both;20 reps;10 reps Quad Sets: AROM;Left;10 reps Gluteal Sets: AROM;Both;5 reps Towel Squeeze: AROM;Both;10 reps;Supine Short Arc Quad: AROM;10 reps;Supine Heel Slides: AROM;Left;10 reps;Supine Hip ABduction/ADduction: AROM;Left;10 reps;Supine Straight Leg Raises: AAROM;5 reps;Left;Supine Long Arc Quad: AROM;Left;10 reps;Seated Knee Flexion: AAROM;Left;Seated Goniometric ROM: 86    General Comments        Pertinent Vitals/Pain Pain Assessment: 0-10 Pain Score: 10-Worst pain ever Pain Location: L knee  Pain Descriptors / Indicators: Aching;Operative site guarding Pain Intervention(s): Monitored during session;RN gave pain meds during session;Repositioned;Ice applied    Home Living Family/patient expects to be discharged to:: Private residence Living Arrangements:  Non-relatives/Friends Available Help at Discharge: Friend(s);Available 24 hours/day Type of Home: Mobile home Home Access: Stairs to enter Entrance Stairs-Rails: Right;Left;Can reach both Home Layout: One level Home Equipment: Cane - single  point;Walker - 2 wheels;Bedside commode      Prior Function Level of Independence: Independent          PT Goals (current goals can now be found in the care plan section) Acute Rehab PT Goals Patient Stated Goal: to decrease pain  PT Goal Formulation: With patient Potential to Achieve Goals: Good Progress towards PT goals: Progressing toward goals    Frequency    7X/week      PT Plan Current plan remains appropriate    Co-evaluation              AM-PAC PT "6 Clicks" Daily Activity  Outcome Measure  Difficulty turning over in bed (including adjusting bedclothes, sheets and blankets)?: A Little Difficulty moving from lying on back to sitting on the side of the bed? : A Lot Difficulty sitting down on and standing up from a chair with arms (e.g., wheelchair, bedside commode, etc,.)?: Unable Help needed moving to and from a bed to chair (including a wheelchair)?: A Little Help needed walking in hospital room?: A Little Help needed climbing 3-5 steps with a railing? : A Lot 6 Click Score: 14    End of Session Equipment Utilized During Treatment: Gait belt Activity Tolerance: Patient tolerated treatment well Patient left: in chair;with call bell/phone within reach Nurse Communication: Mobility status PT Visit Diagnosis: Other abnormalities of gait and mobility (R26.89);Pain Pain - Right/Left: Left Pain - part of body: Knee     Time: [id number] PT Time Calculation (min) (ACUTE ONLY): 16 min  Charges:  $Therapeutic Exercise: 8-22 mins                    G Codes:      Forde RadonSybil Paymon Rosensteel, SPTA   Forde RadonSybil Swati Granberry 08/20/2017, 4:34 PM

## 2017-08-20 NOTE — Op Note (Signed)
TOTAL KNEE REPLACEMENT OPERATIVE NOTE:  08/19/2017  8:14 AM  PATIENT:  Cynthia Richardson  51 y.o. female  PRE-OPERATIVE DIAGNOSIS:  primary osteoarthritis left knee  POST-OPERATIVE DIAGNOSIS:  primary osteoarthritis left knee  PROCEDURE:  Procedure(s): TOTAL KNEE ARTHROPLASTY  SURGEON:  Surgeon(s): Dannielle Huh, MD  PHYSICIAN ASSISTANT: Skip Mayer, [facility]  ANESTHESIA:   spinal  DRAINS: Hemovac  SPECIMEN: None  COUNTS:  Correct  TOURNIQUET:   Total Tourniquet Time Documented: Thigh (Left) - 46 minutes Total: Thigh (Left) - 46 minutes   DICTATION:  Indication for procedure:    The patient is a 51 y.o. female who has failed conservative treatment for primary osteoarthritis left knee.  Informed consent was obtained prior to anesthesia. The risks versus benefits of the operation were explain and in a way the patient can, and did, understand.   On the implant demand matching protocol, this patient scored 7.  Therefore, this patient was not receive a polyethylene insert with vitamin E which is a high demand implant.  Description of procedure:     The patient was taken to the operating room and placed under anesthesia.  The patient was positioned in the usual fashion taking care that all body parts were adequately padded and/or protected.  I foley catheter was not placed.  A tourniquet was applied and the leg prepped and draped in the usual sterile fashion.  The extremity was exsanguinated with the esmarch and tourniquet inflated to 350 mmHg.  Pre-operative range of motion was normal.  The knee was in 4 degree of mild varus.  A midline incision approximately 6-7 inches long was made with a #10 blade.  A new blade was used to make a parapatellar arthrotomy going 2-3 cm into the quadriceps tendon, over the patella, and alongside the medial aspect of the patellar tendon.  A synovectomy was then performed with the #10 blade and forceps. I then elevated the deep MCL off the medial tibial  metaphysis subperiosteally around to the semimembranosus attachment.    I everted the patella and used calipers to measure patellar thickness.  I used the reamer to ream down to appropriate thickness to recreate the native thickness.  I then removed excess bone with the rongeur and sagittal saw.  I used the appropriately sized template and drilled the three lug holes.  I then put the trial in place and measured the thickness with the calipers to ensure recreation of the native thickness.  The trial was then removed and the patella subluxed and the knee brought into flexion.  A homan retractor was place to retract and protect the patella and lateral structures.  A Z-retractor was place medially to protect the medial structures.  The extra-medullary alignment system was used to make cut the tibial articular surface perpendicular to the anamotic axis of the tibia and in 3 degrees of posterior slope.  The cut surface and alignment jig was removed.  I then used the intramedullary alignment guide to make a 3 valgus cut on the distal femur.  I then marked out the epicondylar axis on the distal femur.  The posterior condylar axis measured 3 degrees.  I then used the anterior referencing sizer and measured the femur to be a size 4.  The 4-In-1 cutting block was screwed into place in external rotation matching the posterior condylar angle, making our cuts perpendicular to the epicondylar axis.  Anterior, posterior and chamfer cuts were made with the sagittal saw.  The cutting block and cut pieces were  removed.  A lamina spreader was placed in 90 degrees of flexion.  The ACL, PCL, menisci, and posterior condylar osteophytes were removed.  A 12 mm spacer blocked was found to offer good flexion and extension gap balance after mild in degree releasing.   The scoop retractor was then placed and the femoral finishing block was pinned in place.  The small sagittal saw was used as well as the lug drill to finish the femur.   The block and cut surfaces were removed and the medullary canal hole filled with autograft bone from the cut pieces.  The tibia was delivered forward in deep flexion and external rotation.  A size D tray was selected and pinned into place centered on the medial 1/3 of the tibial tubercle.  The reamer and keel was used to prepare the tibia through the tray.    I then trialed with the size 4 femur, size D tibia, a 12 mm insert and the 32 patella.  I had excellent flexion/extension gap balance, excellent patella tracking.  Flexion was full and beyond 120 degrees; extension was zero.  These components were chosen and the staff opened them to me on the back table while the knee was lavaged copiously and the cement mixed.  The soft tissue was infiltrated with 60cc of exparel 1.3% through a 21 gauge needle.  I cemented in the components and removed all excess cement.  The polyethylene tibial component was snapped into place and the knee placed in extension while cement was hardening.  The capsule was infilltrated with 30cc of .25% Marcaine with epinephrine.  A hemovac was place in the joint exiting superolaterally.  A pain pump was place superomedially superficial to the arthrotomy.  Once the cement was hard, the tourniquet was let down.  Hemostasis was obtained.  The arthrotomy was closed with figure-8 #1 vicryl sutures.  The deep soft tissues were closed with #0 vicryls and the subcuticular layer closed with a running #2-0 vicryl.  The skin was reapproximated and closed with skin staples.  The wound was dressed with xeroform, 4 x4's, 2 ABD sponges, a single layer of webril and a TED stocking.   The patient was then awakened, extubated, and taken to the recovery room in stable condition.  BLOOD LOSS:  300cc DRAINS: 1 hemovac, 1 pain catheter COMPLICATIONS:  None.  PLAN OF CARE: Admit to inpatient   PATIENT DISPOSITION:  PACU - hemodynamically stable.   Delay start of Pharmacological VTE agent (>24hrs)  due to surgical blood loss or risk of bleeding:  not applicable  Please fax a copy of this op note to my office at [phone] (please only include page 1 and 2 of the Case Information op note)

## 2017-08-21 LAB — CBC
HCT: 34.7 % — ABNORMAL LOW (ref 36.0–46.0)
HEMOGLOBIN: 11.5 g/dL — AB (ref 12.0–15.0)
MCH: 30.1 pg (ref 26.0–34.0)
MCHC: 33.1 g/dL (ref 30.0–36.0)
MCV: 90.8 fL (ref 78.0–100.0)
Platelets: 244 10*3/uL (ref 150–400)
RBC: 3.82 MIL/uL — AB (ref 3.87–5.11)
RDW: 13.2 % (ref 11.5–15.5)
WBC: 14.5 10*3/uL — ABNORMAL HIGH (ref 4.0–10.5)

## 2017-08-21 NOTE — Progress Notes (Signed)
Physical Therapy Treatment Patient Details Name: Cynthia Richardson MRN: [id number] DOB: 02/27/1966 Today's Date: 08/21/2017    History of Present Illness Pt is a 51 y/o female s/p elective L TKA. PMH includes anxiety, bipolar disorder, CHF, HTN, chronic back pain, seizures, nonischemic cardiomyopathy, and R TKA.     PT Comments    Patient is making progress toward mobility goals and able to safely negotiate stairs with supervision for safety. Pt required min guard for ambulation with RW and tolerated increased gait distance. Review HEP next session. Current plan remains appropriate.    Follow Up Recommendations  DC plan and follow up therapy as arranged by surgeon;Supervision for mobility/OOB     Equipment Recommendations  None recommended by PT    Recommendations for Other Services       Precautions / Restrictions Precautions Precautions: Knee Precaution Booklet Issued: No Precaution Comments: Reviewed knee precautions Restrictions Weight Bearing Restrictions: Yes LLE Weight Bearing: Weight bearing as tolerated    Mobility  Bed Mobility               General bed mobility comments: pt OOB in chair upon arrival  Transfers Overall transfer level: Modified independent Equipment used: Rolling walker (2 wheeled) Transfers: Sit to/from Stand           General transfer comment: increased effort to stand with safe hand placement and use of RW upon standing   Ambulation/Gait Ambulation/Gait assistance: Min guard Ambulation Distance (Feet): 200 Feet Assistive device: Rolling walker (2 wheeled) Gait Pattern/deviations: Decreased step length - right;Decreased stance time - left;Antalgic;Step-through pattern;Step-to pattern;Decreased stride length Gait velocity: decreased   General Gait Details: slow, steady gait; cues for posture and increased L knee flexion during swing phase   Stairs Stairs: Yes   Stair Management: Two rails;Step to pattern;Forwards Number of  Stairs: 2 General stair comments: cues for sequencing and technique; supervision for safety  Wheelchair Mobility    Modified Rankin (Stroke Patients Only)       Balance Overall balance assessment: Needs assistance Sitting-balance support: No upper extremity supported;Feet supported Sitting balance-Leahy Scale: Good       Standing balance-Leahy Scale: Fair                              Cognition Arousal/Alertness: Awake/alert Behavior During Therapy: WFL for tasks assessed/performed Overall Cognitive Status: Within Functional Limits for tasks assessed                                        Exercises      General Comments        Pertinent Vitals/Pain Pain Assessment: Faces Faces Pain Scale: Hurts even more Pain Location: L knee  Pain Descriptors / Indicators: Aching;Operative site guarding;Sore Pain Intervention(s): Monitored during session;Premedicated before session;Repositioned;Ice applied    Home Living                      Prior Function            PT Goals (current goals can now be found in the care plan section) Acute Rehab PT Goals Patient Stated Goal: to decrease pain  PT Goal Formulation: With patient Time For Goal Achievement: 08/26/17 Potential to Achieve Goals: Good Progress towards PT goals: Progressing toward goals    Frequency    7X/week  PT Plan Current plan remains appropriate    Co-evaluation              AM-PAC PT "6 Clicks" Daily Activity  Outcome Measure  Difficulty turning over in bed (including adjusting bedclothes, sheets and blankets)?: A Little Difficulty moving from lying on back to sitting on the side of the bed? : A Lot Difficulty sitting down on and standing up from a chair with arms (e.g., wheelchair, bedside commode, etc,.)?: A Lot Help needed moving to and from a bed to chair (including a wheelchair)?: None Help needed walking in hospital room?: A Little Help needed  climbing 3-5 steps with a railing? : A Little 6 Click Score: 17    End of Session Equipment Utilized During Treatment: Gait belt Activity Tolerance: Patient tolerated treatment well Patient left: in chair;with call bell/phone within reach Nurse Communication: Mobility status PT Visit Diagnosis: Other abnormalities of gait and mobility (R26.89);Pain Pain - Right/Left: Left Pain - part of body: Knee     Time: 9767-3419 PT Time Calculation (min) (ACUTE ONLY): 13 min  Charges:  $Gait Training: 8-22 mins                    G Codes:       Erline Levine, PTA Pager: [phone]     Carolynne Edouard 08/21/2017, 2:38 PM

## 2017-08-21 NOTE — Progress Notes (Signed)
Occupational Therapy Treatment Patient Details Name: Cynthia Richardson MRN: [id number] DOB: 1966/02/18 Today's Date: 08/21/2017    History of present illness Pt is a 51 y/o female s/p elective L TKA. PMH includes anxiety, bipolar disorder, CHF, HTN, chronic back pain, seizures, nonischemic cardiomyopathy, and R TKA.    OT comments  Pt on her way to the bathroom with RW upon OTs arrival. Supervised ambulation. Pt able to perform toileting and standing grooming modified independently. Reviewed compensatory strategies for LB bathing and dressing, safe footwear, tub transfer technique using 3 in 1 and transporting items safely with walker. Pt is hopeful to go home today.   Follow Up Recommendations  No OT follow up;Supervision/Assistance - 24 hour    Equipment Recommendations  None recommended by OT    Recommendations for Other Services      Precautions / Restrictions Precautions Precautions: Knee Precaution Booklet Issued: No Restrictions Weight Bearing Restrictions: Yes LLE Weight Bearing: Weight bearing as tolerated       Mobility Bed Mobility               General bed mobility comments: pt OOB upon arrival  Transfers Overall transfer level: Modified independent Equipment used: Rolling walker (2 wheeled)             General transfer comment: from 3 in 1    Balance     Sitting balance-Leahy Scale: Good       Standing balance-Leahy Scale: Fair Standing balance comment: able to release walker in static standing during ADL                           ADL either performed or assessed with clinical judgement   ADL Overall ADL's : Needs assistance/impaired     Grooming: Modified independent;Standing         Lower Body Bathing Details (indicate cue type and reason): educated in benefits of using long handled bath brush or sponge Upper Body Dressing : Set up;Standing   Lower Body Dressing: Minimal assistance;Sit to/from stand Lower Body Dressing  Details (indicate cue type and reason): educated to dress L LE first and in safe footwear Toilet Transfer: Supervision/safety;RW;Ambulation   Toileting- Clothing Manipulation and Hygiene: Modified independent;Sit to/from Nurse, children's Details (indicate cue type and reason): pt aware that 3 in 1 can be used as shower seat and how to set up and transfer from previous R TKA Functional mobility during ADLs: Supervision/safety;Rolling walker General ADL Comments: Instructed in how to transport items safely with RW.     Vision       Perception     Praxis      Cognition Arousal/Alertness: Awake/alert Behavior During Therapy: WFL for tasks assessed/performed Overall Cognitive Status: Within Functional Limits for tasks assessed                                          Exercises     Shoulder Instructions       General Comments      Pertinent Vitals/ Pain       Pain Assessment: 0-10 Pain Score: 6  Pain Location: L knee  Pain Descriptors / Indicators: Aching;Operative site guarding Pain Intervention(s): Monitored during session;Repositioned;Ice applied  Home Living  Prior Functioning/Environment              Frequency  Min 2X/week        Progress Toward Goals  OT Goals(current goals can now be found in the care plan section)  Progress towards OT goals: Progressing toward goals  Acute Rehab OT Goals Patient Stated Goal: to decrease pain  OT Goal Formulation: With patient Time For Goal Achievement: 09/03/17 Potential to Achieve Goals: Good  Plan Discharge plan remains appropriate    Co-evaluation                 AM-PAC PT "6 Clicks" Daily Activity     Outcome Measure   Help from another person eating meals?: None Help from another person taking care of personal grooming?: None Help from another person toileting, which includes using toliet, bedpan, or  urinal?: A Little Help from another person bathing (including washing, rinsing, drying)?: A Little Help from another person to put on and taking off regular upper body clothing?: None Help from another person to put on and taking off regular lower body clothing?: A Little 6 Click Score: 21    End of Session Equipment Utilized During Treatment: Gait belt;Rolling walker CPM Left Knee CPM Left Knee: Off  OT Visit Diagnosis: Other abnormalities of gait and mobility (R26.89);Pain Pain - Right/Left: Left Pain - part of body: Knee   Activity Tolerance Patient tolerated treatment well   Patient Left in chair;with call bell/phone within reach (in bone foam)   Nurse Communication          Time: 9604-5409: 0900-0918 OT Time Calculation (min): 18 min  Charges: OT General Charges $OT Visit: 1 Visit OT Treatments $Self Care/Home Management : 8-22 mins  08/21/2017 Martie RoundJulie Kylan Veach, OTR/L Pager: [phone]   Iran PlanasMayberry, [place] BailiffJulie Lynn 08/21/2017, 9:24 AM

## 2017-08-21 NOTE — Progress Notes (Signed)
Orthopedic Tech Progress Note Patient Details:  Cynthia Richardson October 21, 1966 [id number]  Patient ID: Cynthia Richardson, female   DOB: August 03, 1966, 51 y.o.   MRN: [id number]  Pt was in bone foam and didn't want to come out. Pt will call when ready for cpm.  Trinna Post 08/21/2017, 7:01 AM

## 2017-08-21 NOTE — Progress Notes (Signed)
Physical Therapy Treatment Patient Details Name: Cynthia Richardson MRN: [id number] DOB: 1966-03-07 Today's Date: 08/21/2017    History of Present Illness Pt is a 51 y/o female s/p elective L TKA. PMH includes anxiety, bipolar disorder, CHF, HTN, chronic back pain, seizures, nonischemic cardiomyopathy, and R TKA.     PT Comments    Patient tolerated HEP well and able to complete with min vc and assistance. Pt continues to progress well and is eager to d/c home. Current plan remains appropriate.   Follow Up Recommendations  DC plan and follow up therapy as arranged by surgeon;Supervision for mobility/OOB     Equipment Recommendations  None recommended by PT    Recommendations for Other Services       Precautions / Restrictions Precautions Precautions: Knee Precaution Booklet Issued: No Precaution Comments: Reviewed knee precautions Restrictions Weight Bearing Restrictions: Yes LLE Weight Bearing: Weight bearing as tolerated    Mobility  Bed Mobility Overal bed mobility: Modified Independent Bed Mobility: Supine to Sit;Sit to Supine           General bed mobility comments: increased time and effort  Transfers Overall transfer level: Modified independent Equipment used: Rolling walker (2 wheeled) Transfers: Sit to/from Stand           General transfer comment: increased effort to stand with safe hand placement and use of RW upon standing   Ambulation/Gait Ambulation/Gait assistance: Min guard Ambulation Distance (Feet): 200 Feet Assistive device: Rolling walker (2 wheeled) Gait Pattern/deviations: Decreased step length - right;Decreased stance time - left;Antalgic;Step-through pattern;Step-to pattern;Decreased stride length Gait velocity: decreased   General Gait Details: slow, steady gait; cues for posture and increased L knee flexion during swing phase   Stairs Stairs: Yes   Stair Management: Two rails;Step to pattern;Forwards Number of Stairs: 2 General  stair comments: cues for sequencing and technique; supervision for safety  Wheelchair Mobility    Modified Rankin (Stroke Patients Only)       Balance Overall balance assessment: Needs assistance Sitting-balance support: No upper extremity supported;Feet supported Sitting balance-Leahy Scale: Good       Standing balance-Leahy Scale: Fair                              Cognition Arousal/Alertness: Awake/alert Behavior During Therapy: WFL for tasks assessed/performed Overall Cognitive Status: Within Functional Limits for tasks assessed                                        Exercises Total Joint Exercises Ankle Circles/Pumps: AROM;Both;20 reps Quad Sets: AROM;Left;10 reps Short Arc Quad: AROM;10 reps;Supine;Left Heel Slides: AROM;Left;10 reps;Supine Hip ABduction/ADduction: AROM;Left;10 reps;Supine Straight Leg Raises: Left;Supine;10 reps;AROM Long Arc Quad: AROM;Left;10 reps;Seated Knee Flexion: Left;Seated;AROM;Other (comment);10 reps (10 second holds) Goniometric ROM: 90 degrees flexion    General Comments        Pertinent Vitals/Pain Pain Assessment: Faces Faces Pain Scale: Hurts little more Pain Location: posterior L knee Pain Descriptors / Indicators: Sore;Grimacing;Guarding Pain Intervention(s): Monitored during session;Premedicated before session;Repositioned    Home Living                      Prior Function            PT Goals (current goals can now be found in the care plan section) Acute Rehab PT Goals Patient Stated Goal: to decrease  pain  PT Goal Formulation: With patient Time For Goal Achievement: 08/26/17 Potential to Achieve Goals: Good Progress towards PT goals: Progressing toward goals    Frequency    7X/week      PT Plan Current plan remains appropriate    Co-evaluation              AM-PAC PT "6 Clicks" Daily Activity  Outcome Measure  Difficulty turning over in bed (including  adjusting bedclothes, sheets and blankets)?: A Little Difficulty moving from lying on back to sitting on the side of the bed? : A Lot Difficulty sitting down on and standing up from a chair with arms (e.g., wheelchair, bedside commode, etc,.)?: A Lot Help needed moving to and from a bed to chair (including a wheelchair)?: None Help needed walking in hospital room?: None Help needed climbing 3-5 steps with a railing? : A Little 6 Click Score: 18    End of Session Equipment Utilized During Treatment: Gait belt Activity Tolerance: Patient tolerated treatment well Patient left: with call bell/phone within reach;in bed Nurse Communication: Mobility status PT Visit Diagnosis: Other abnormalities of gait and mobility (R26.89);Pain Pain - Right/Left: Left Pain - part of body: Knee     Time: [id number] PT Time Calculation (min) (ACUTE ONLY): 18 min  Charges:   $Therapeutic Exercise: 8-22 mins                    G Codes:       Erline LevineKellyn Paola Flynt, PTA Pager: [phone]     Carolynne EdouardKellyn R Teriah Muela 08/21/2017, 4:09 PM

## 2017-08-22 LAB — CBC
HCT: 34.3 % — ABNORMAL LOW (ref 36.0–46.0)
Hemoglobin: 11.1 g/dL — ABNORMAL LOW (ref 12.0–15.0)
MCH: 29.8 pg (ref 26.0–34.0)
MCHC: 32.4 g/dL (ref 30.0–36.0)
MCV: 92 fL (ref 78.0–100.0)
PLATELETS: 225 10*3/uL (ref 150–400)
RBC: 3.73 MIL/uL — AB (ref 3.87–5.11)
RDW: 13.3 % (ref 11.5–15.5)
WBC: 11.3 10*3/uL — AB (ref 4.0–10.5)

## 2017-08-22 MED ORDER — INFLUENZA VAC SPLIT QUAD 0.5 ML IM SUSY
0.5000 mL | PREFILLED_SYRINGE | INTRAMUSCULAR | Status: AC
  Administered 2017-08-22: 0.5 mL via INTRAMUSCULAR

## 2017-08-22 NOTE — Progress Notes (Signed)
Initial Nutrition Assessment  DOCUMENTATION CODES:   Morbid obesity  INTERVENTION:   -Ensure Enlive BID; each supplement provides 350 kcals and 20 grams of protein  NUTRITION DIAGNOSIS:   Increased nutrient needs related to post-op healing (R TKA) as evidenced by estimated needs.  GOAL:   Patient will meet greater than or equal to 90% of their needs  MONITOR:   PO intake, Supplement acceptance, Weight trends, Labs  REASON FOR ASSESSMENT:   Malnutrition Screening Tool   ASSESSMENT:   Pt is a 51 year old female who underwent a R TKA on 10/29. PMH of L TKA on 05/13/17, CHF, HTN, seizures, nonischemic cardiomyopathy, anxiety, and bipolar disorder.  Pt states that her appetite has been decreased for the past few months due to knee pain. Pt eats 2-3 meals per day and has been trying to make healthier choices- chicken salad, sandwiches, soups, cereal. Pt does not drink nutritional supplements. Per chart pt has been eating 100% of her meals since 10/29.   Pt states that she intentionally lost weight in the last 6 months so she could get her TKA surgery. Pt is unsure of amount. Reports that her UBW is 253 lbs. Per weight records in chart, pt weighed 264 lbs on 11/1, 254 lbs on 10/18, and 255 lbs on 7/23. There are no other weight records in the chart.   Medications- Colace  Labs- Glucose 123 (H), Ca 8.7 (L)  NUTRITION - FOCUSED PHYSICAL EXAM:    Most Recent Value  Orbital Region  No depletion  Upper Arm Region  No depletion  Thoracic and Lumbar Region  No depletion  Buccal Region  No depletion  Temple Region  No depletion  Clavicle Bone Region  No depletion  Clavicle and Acromion Bone Region  No depletion  Scapular Bone Region  No depletion  Dorsal Hand  No depletion  Patellar Region  No depletion  Anterior Thigh Region  No depletion  Posterior Calf Region  No depletion  Edema (RD Assessment)  Mild [LLE, non-pitting]  Hair  Reviewed  Eyes  Reviewed  Mouth  Reviewed   Skin  Reviewed  Nails  Reviewed      Diet Order:  Diet regular Room service appropriate? Yes; Fluid consistency: Thin Diet - low sodium heart healthy  EDUCATION NEEDS:   No education needs have been identified at this time  Skin:  Skin Assessment: Reviewed RN Assessment (surgical incision on L knee)  Last BM:  08/18/17  Height:   Ht Readings from Last 1 Encounters:  08/22/17 5\' 5"  (1.651 m)    Weight:   Wt Readings from Last 1 Encounters:  08/22/17 264 lb 8.8 oz (120 kg)    Ideal Body Weight:  56.8 kg  BMI:  Body mass index is 44.02 kg/m.  Estimated Nutritional Needs:   Kcal:  2100-2300 kcals  Protein:  105-115 grams protein  Fluid:  >/= 2 L   Wynetta Emery [facility] Dietetic Intern Pager: [phone] 08/22/2017 10:35 AM

## 2017-08-22 NOTE — Progress Notes (Signed)
Physical Therapy Treatment Patient Details Name: Cynthia Richardson MRN: [id number] DOB: 04/04/1966 Today's Date: 08/22/2017    History of Present Illness Pt is a 51 y/o female s/p elective L TKA. PMH includes anxiety, bipolar disorder, CHF, HTN, chronic back pain, seizures, nonischemic cardiomyopathy, and R TKA.     PT Comments    Patient is making good progress with PT.  From a mobility standpoint anticipate patient will be ready for DC home when medically ready. Continue to progress as tolerated with anticipated d/c home with HHPT.    Follow Up Recommendations  DC plan and follow up therapy as arranged by surgeon;Supervision for mobility/OOB     Equipment Recommendations  None recommended by PT    Recommendations for Other Services       Precautions / Restrictions Precautions Precautions: Knee Restrictions Weight Bearing Restrictions: Yes LLE Weight Bearing: Weight bearing as tolerated    Mobility  Bed Mobility Overal bed mobility: Independent                Transfers Overall transfer level: Modified independent Equipment used: Rolling walker (2 wheeled) Transfers: Sit to/from Stand           General transfer comment: increased effort to stand with safe hand placement and use of RW upon standing   Ambulation/Gait Ambulation/Gait assistance: Supervision Ambulation Distance (Feet): 220 Feet Assistive device: Rolling walker (2 wheeled) Gait Pattern/deviations: Decreased step length - right;Decreased stance time - left;Antalgic;Step-through pattern;Step-to pattern;Decreased stride length Gait velocity: decreased   General Gait Details: cues for L heel strike/toe off and posture; supervision for safety   Stairs            Wheelchair Mobility    Modified Rankin (Stroke Patients Only)       Balance Overall balance assessment: Needs assistance Sitting-balance support: No upper extremity supported;Feet supported Sitting balance-Leahy Scale: Good     Standing balance support: During functional activity;Single extremity supported Standing balance-Leahy Scale: Fair                              Cognition Arousal/Alertness: Awake/alert Behavior During Therapy: WFL for tasks assessed/performed Overall Cognitive Status: Within Functional Limits for tasks assessed                                        Exercises Total Joint Exercises Quad Sets: AROM;Left;10 reps Heel Slides: AROM;Left;10 reps Hip ABduction/ADduction: AROM;Left;10 reps Straight Leg Raises: Left;10 reps;AROM Goniometric ROM: 90 degrees flexion    General Comments        Pertinent Vitals/Pain Pain Assessment: 0-10 Pain Score: 7  Pain Location: posterior L knee Pain Descriptors / Indicators: Sore;Grimacing;Guarding Pain Intervention(s): Limited activity within patient's tolerance;Monitored during session;Premedicated before session;Repositioned    Home Living Family/patient expects to be discharged to:: Private residence                    Prior Function            PT Goals (current goals can now be found in the care plan section) Acute Rehab PT Goals PT Goal Formulation: With patient Time For Goal Achievement: 08/26/17 Potential to Achieve Goals: Good Progress towards PT goals: Progressing toward goals    Frequency    7X/week      PT Plan Current plan remains appropriate    Co-evaluation  AM-PAC PT "6 Clicks" Daily Activity  Outcome Measure  Difficulty turning over in bed (including adjusting bedclothes, sheets and blankets)?: None Difficulty moving from lying on back to sitting on the side of the bed? : A Little Difficulty sitting down on and standing up from a chair with arms (e.g., wheelchair, bedside commode, etc,.)?: A Little Help needed moving to and from a bed to chair (including a wheelchair)?: None Help needed walking in hospital room?: None Help needed climbing 3-5 steps with a  railing? : A Little 6 Click Score: 21    End of Session Equipment Utilized During Treatment: Gait belt Activity Tolerance: Patient tolerated treatment well Patient left: with call bell/phone within reach;in bed Nurse Communication: Mobility status PT Visit Diagnosis: Other abnormalities of gait and mobility (R26.89);Pain Pain - Right/Left: Left Pain - part of body: Knee     Time: [id number] PT Time Calculation (min) (ACUTE ONLY): 23 min  Charges:  $Gait Training: 8-22 mins $Therapeutic Exercise: 8-22 mins                    G Codes:       Erline LevineKellyn Baron Parmelee, PTA Pager: [phone]) [phone]     Carolynne EdouardKellyn R Narya Beavin 08/22/2017, 9:01 AM

## 2017-08-22 NOTE — Discharge Summary (Signed)
SPORTS MEDICINE & JOINT REPLACEMENT   Cynthia Spurling, MD   Cynthia Nancy, PA-C [address] [place], [place], [place]  [address]                             [phone]  PATIENT ID: Cynthia Richardson        MRN:  [id number]          DOB/AGE: 53-Jul-1967 / 51 y.o.    DISCHARGE SUMMARY  ADMISSION DATE:    08/19/2017 DISCHARGE DATE:   08/22/2017   ADMISSION DIAGNOSIS: primary osteoarthritis left knee    DISCHARGE DIAGNOSIS:  primary osteoarthritis left knee    ADDITIONAL DIAGNOSIS: Active Problems:   S/P total knee replacement  Past Medical History:  Diagnosis Date  . Anxiety   . Arthritis   . Bipolar disorder (HCC)   . Bulging lumbar disc   . CHF (congestive heart failure) (HCC)    2011  . Chronic back pain   . Degenerative disc disease   . Depression   . Headache   . History of kidney stones   . Hypertension   . Hypertensive encephalopathy   . Nonischemic cardiomyopathy (HCC)    stress inducted (Takotsubo type) 12/2009 in the setting of unintentional drug OD with acute respiratory and renal failure; No CAD by 12/2009 cath; EF recovered (> 55% '15, '17)  . Osteoarthritis of knees, bilateral   . Pneumonia   . Respiratory failure [facility])    March 2011  . Seizures (HCC)     PROCEDURE: Procedure(s): TOTAL KNEE ARTHROPLASTY on 08/19/2017  CONSULTS:    HISTORY:  See H&P in chart  HOSPITAL COURSE:  Cynthia Richardson is a 51 y.o. admitted on 08/19/2017 and found to have a diagnosis of primary osteoarthritis left knee.  After appropriate laboratory studies were obtained  they were taken to the operating room on 08/19/2017 and underwent Procedure(s): TOTAL KNEE ARTHROPLASTY.   They were given perioperative antibiotics:  Anti-infectives    Start     Dose/Rate Route Frequency Ordered Stop   08/19/17 1500  ceFAZolin (ANCEF) IVPB 1 g/50 mL premix     1 g 100 mL/hr over 30 Minutes Intravenous Every 6 hours 08/19/17 1452 08/19/17 2040   08/19/17 0900  ceFAZolin (ANCEF) IVPB 2g/100 mL  premix     2 g 200 mL/hr over 30 Minutes Intravenous To ShortStay Surgical 08/16/17 1412 08/19/17 0913    .  Patient given tranexamic acid IV or topical and exparel intra-operatively.  Tolerated the procedure well.    POD# 1: Vital signs were stable.  Patient denied Chest pain, shortness of breath, or calf pain.  Patient was started on Lovenox 30 mg subcutaneously twice daily at 8am.  Consults to PT, OT, and care management were made.  The patient was weight bearing as tolerated.  CPM was placed on the operative leg 0-90 degrees for 6-8 hours a day. When out of the CPM, patient was placed in the foam block to achieve full extension. Incentive spirometry was taught.  Dressing was changed.       POD #2, Continued  PT for ambulation and exercise program.  IV saline locked.  O2 discontinued.    The remainder of the hospital course was dedicated to ambulation and strengthening.   The patient was discharged on 3 Days Post-Op in  Good condition.  Blood products given:none  DIAGNOSTIC STUDIES: Recent vital signs: Patient Vitals for the past 24 hrs:  BP Temp Temp src Pulse Resp SpO2 Height Weight  08/22/17 0544 120/72 98 F (36.7 C) Oral 84 15 96 % 5\' 5"  (1.651 m) 120 kg (264 lb 8.8 oz)  08/21/17 2101 (!) 150/78 98.2 F (36.8 C) Oral 80 15 97 % - -  08/21/17 1000 (!) 169/88 - - 95 - - - -       Recent laboratory studies:  Recent Labs  08/20/17 0548 08/21/17 0544 08/22/17 0507  WBC 15.3* 14.5* 11.3*  HGB 11.4* 11.5* 11.1*  HCT 35.5* 34.7* 34.3*  PLT 243 244 225    Recent Labs  08/20/17 0548  NA 138  K 4.8  CL 106  CO2 27  BUN 9  CREATININE 0.94  GLUCOSE 123*  CALCIUM 8.7*   Lab Results  Component Value Date   INR 1.08 12/30/2009     Recent Radiographic Studies :  No results found.  DISCHARGE INSTRUCTIONS: Discharge Instructions    CPM    Complete by:  As directed    Continuous passive motion machine (CPM):      Use the CPM from 0 to 90 for 4-6 hours per  day.      You may increase by 10 per day.  You may break it up into 2 or 3 sessions per day.      Use CPM for 2 weeks or until you are told to stop.   Call MD / Call 911    Complete by:  As directed    If you experience chest pain or shortness of breath, CALL 911 and be transported to the hospital emergency room.  If you develope a fever above 101 F, pus (white drainage) or increased drainage or redness at the wound, or calf pain, call your surgeon's office.   Constipation Prevention    Complete by:  As directed    Drink plenty of fluids.  Prune juice may be helpful.  You may use a stool softener, such as Colace (over the counter) 100 mg twice a day.  Use MiraLax (over the counter) for constipation as needed.   Diet - low sodium heart healthy    Complete by:  As directed    Discharge instructions    Complete by:  As directed    INSTRUCTIONS AFTER JOINT REPLACEMENT   Remove items at home which could result in a fall. This includes throw rugs or furniture in walking pathways ICE to the affected joint every three hours while awake for 30 minutes at a time, for at least the first 3-5 days, and then as needed for pain and swelling.  Continue to use ice for pain and swelling. You may notice swelling that will progress down to the foot and ankle.  This is normal after surgery.  Elevate your leg when you are not up walking on it.   Continue to use the breathing machine you got in the hospital (incentive spirometer) which will help keep your temperature down.  It is common for your temperature to cycle up and down following surgery, especially at night when you are not up moving around and exerting yourself.  The breathing machine keeps your lungs expanded and your temperature down.   DIET:  As you were doing prior to hospitalization, we recommend a well-balanced diet.  DRESSING / WOUND CARE / SHOWERING  Keep the surgical dressing until follow up.  The dressing is water proof, so you can shower  without any extra covering.  IF THE DRESSING FALLS OFF or  the wound gets wet inside, change the dressing with sterile gauze.  Please use good hand washing techniques before changing the dressing.  Do not use any lotions or creams on the incision until instructed by your surgeon.    ACTIVITY  Increase activity slowly as tolerated, but follow the weight bearing instructions below.   No driving for 6 weeks or until further direction given by your physician.  You cannot drive while taking narcotics.  No lifting or carrying greater than 10 lbs. until further directed by your surgeon. Avoid periods of inactivity such as sitting longer than an hour when not asleep. This helps prevent blood clots.  You may return to work once you are authorized by your doctor.     WEIGHT BEARING   Weight bearing as tolerated with assist device (walker, cane, etc) as directed, use it as long as suggested by your surgeon or therapist, typically at least 4-6 weeks.   EXERCISES  Results after joint replacement surgery are often greatly improved when you follow the exercise, range of motion and muscle strengthening exercises prescribed by your doctor. Safety measures are also important to protect the joint from further injury. Any time any of these exercises cause you to have increased pain or swelling, decrease what you are doing until you are comfortable again and then slowly increase them. If you have problems or questions, call your caregiver or physical therapist for advice.   Rehabilitation is important following a joint replacement. After just a few days of immobilization, the muscles of the leg can become weakened and shrink (atrophy).  These exercises are designed to build up the tone and strength of the thigh and leg muscles and to improve motion. Often times heat used for twenty to thirty minutes before working out will loosen up your tissues and help with improving the range of motion but do not use heat for the  first two weeks following surgery (sometimes heat can increase post-operative swelling).   These exercises can be done on a training (exercise) mat, on the floor, on a table or on a bed. Use whatever works the best and is most comfortable for you.    Use music or television while you are exercising so that the exercises are a pleasant break in your day. This will make your life better with the exercises acting as a break in your routine that you can look forward to.   Perform all exercises about fifteen times, three times per day or as directed.  You should exercise both the operative leg and the other leg as well.   Exercises include:   Quad Sets - Tighten up the muscle on the front of the thigh (Quad) and hold for 5-10 seconds.   Straight Leg Raises - With your knee straight (if you were given a brace, keep it on), lift the leg to 60 degrees, hold for 3 seconds, and slowly lower the leg.  Perform this exercise against resistance later as your leg gets stronger.  Leg Slides: Lying on your back, slowly slide your foot toward your buttocks, bending your knee up off the floor (only go as far as is comfortable). Then slowly slide your foot back down until your leg is flat on the floor again.  Angel Wings: Lying on your back spread your legs to the side as far apart as you can without causing discomfort.  Hamstring Strength:  Lying on your back, push your heel against the floor with your leg straight by tightening  up the muscles of your buttocks.  Repeat, but this time bend your knee to a comfortable angle, and push your heel against the floor.  You may put a pillow under the heel to make it more comfortable if necessary.   A rehabilitation program following joint replacement surgery can speed recovery and prevent re-injury in the future due to weakened muscles. Contact your doctor or a physical therapist for more information on knee rehabilitation.    CONSTIPATION  Constipation is defined medically as  fewer than three stools per week and severe constipation as less than one stool per week.  Even if you have a regular bowel pattern at home, your normal regimen is likely to be disrupted due to multiple reasons following surgery.  Combination of anesthesia, postoperative narcotics, change in appetite and fluid intake all can affect your bowels.   YOU MUST use at least one of the following options; they are listed in order of increasing strength to get the job done.  They are all available over the counter, and you may need to use some, POSSIBLY even all of these options:    Drink plenty of fluids (prune juice may be helpful) and high fiber foods Colace 100 mg by mouth twice a day  Senokot for constipation as directed and as needed Dulcolax (bisacodyl), take with full glass of water  Miralax (polyethylene glycol) once or twice a day as needed.  If you have tried all these things and are unable to have a bowel movement in the first 3-4 days after surgery call either your surgeon or your primary doctor.    If you experience loose stools or diarrhea, hold the medications until you stool forms back up.  If your symptoms do not get better within 1 week or if they get worse, check with your doctor.  If you experience "the worst abdominal pain ever" or develop nausea or vomiting, please contact the office immediately for further recommendations for treatment.   ITCHING:  If you experience itching with your medications, try taking only a single pain pill, or even half a pain pill at a time.  You can also use Benadryl over the counter for itching or also to help with sleep.   TED HOSE STOCKINGS:  Use stockings on both legs until for at least 2 weeks or as directed by physician office. They may be removed at night for sleeping.  MEDICATIONS:  See your medication summary on the "After Visit Summary" that nursing will review with you.  You may have some home medications which will be placed on hold until you  complete the course of blood thinner medication.  It is important for you to complete the blood thinner medication as prescribed.  PRECAUTIONS:  If you experience chest pain or shortness of breath - call 911 immediately for transfer to the hospital emergency department.   If you develop a fever greater that 101 F, purulent drainage from wound, increased redness or drainage from wound, foul odor from the wound/dressing, or calf pain - CONTACT YOUR SURGEON.                                                   FOLLOW-UP APPOINTMENTS:  If you do not already have a post-op appointment, please call the office for an appointment to be seen by your surgeon.  Guidelines  for how soon to be seen are listed in your "After Visit Summary", but are typically between 1-4 weeks after surgery.  OTHER INSTRUCTIONS:   Knee Replacement:  Do not place pillow under knee, focus on keeping the knee straight while resting. CPM instructions: 0-90 degrees, 2 hours in the morning, 2 hours in the afternoon, and 2 hours in the evening. Place foam block, curve side up under heel at all times except when in CPM or when walking.  DO NOT modify, tear, cut, or change the foam block in any way.  MAKE SURE YOU:  Understand these instructions.  Get help right away if you are not doing well or get worse.    Thank you for letting us be a part of your medical care team.  It is a privilege we respect greatly.  We hope these instructions will help you stay on track for a fast and full recovery!   Increase activity slowly as tolerated    Complete by:  As directed       DISCHARGE MEDICATIONS:   Allergies as of 08/22/2017   No Known Allergies     Medication List    STOP taking these medications   HYDROcodone-acetaminophen 5-325 MG tablet Commonly known as:  NORCO/VICODIN Replaced by:  HYDROcodone-acetaminophen 10-325 MG tablet   morphine 15 MG tablet Commonly known as:  MSIR     TAKE these medications   acetaminophen 500 MG  tablet Commonly known as:  TYLENOL Take 1,000 mg by mouth every 6 (six) hours as needed for moderate pain or headache.   ARIPiprazole 10 MG tablet Commonly known as:  ABILIFY Take 10 mg by mouth daily.   aspirin 325 MG EC tablet Take 1 tablet (325 mg total) by mouth 2 (two) times daily.   atenolol-chlorthalidone 50-25 MG tablet Commonly known as:  TENORETIC Take 1 tablet by mouth daily.   DULoxetine 30 MG capsule Commonly known as:  CYMBALTA Take 30 mg by mouth 2 (two) times daily.   HYDROcodone-acetaminophen 10-325 MG tablet Commonly known as:  NORCO Take 1 tablet by mouth every 4 (four) hours as needed for moderate pain ((score 4 to 6)). Replaces:  HYDROcodone-acetaminophen 5-325 MG tablet   hydrOXYzine 25 MG capsule Commonly known as:  VISTARIL Take 25 mg by mouth daily as needed for anxiety.   methocarbamol 500 MG tablet Commonly known as:  ROBAXIN Take 1-2 tablets (500-1,000 mg total) by mouth every 6 (six) hours as needed for muscle spasms.   Oxcarbazepine 300 MG tablet Commonly known as:  TRILEPTAL Take 300 mg by mouth 2 (two) times daily.   traMADol 50 MG tablet Commonly known as:  ULTRAM Take 1-2 tablets (50-100 mg total) by mouth every 6 (six) hours.   traZODone 50 MG tablet Commonly known as:  DESYREL Take 50 mg by mouth at bedtime.            Durable Medical Equipment        Start     Ordered   08/19/17 1453  DME Walker rolling  Once    Question:  Patient needs a walker to treat with the following condition  Answer:  S/P total knee replacement   08/19/17 1452   08/19/17 1453  DME 3 n 1  Once     08/19/17 1452   08/19/17 1453  DME Bedside commode  Once    Question:  Patient needs a bedside commode to treat with the following condition  Answer:  S/P total knee replacement  08/19/17 1452      FOLLOW UP VISIT:   Follow-up Information    [facility] Follow up.   Specialty:  Home Health Services Why:  Physical therapist will contact you  within 1-2 days of discharge to arrange therapy. Contact information: [address] [place] [address] [place] [id number] [phone]           DISPOSITION: HOME VS. SNF  CONDITION:  Good   Guy SandiferColby Alan Robbins 08/22/2017, 8:24 AM

## 2017-08-24 DIAGNOSIS — F319 Bipolar disorder, unspecified: Secondary | ICD-10-CM | POA: Diagnosis not present

## 2017-08-24 DIAGNOSIS — M5137 Other intervertebral disc degeneration, lumbosacral region: Secondary | ICD-10-CM | POA: Diagnosis not present

## 2017-08-24 DIAGNOSIS — Z471 Aftercare following joint replacement surgery: Secondary | ICD-10-CM | POA: Diagnosis not present

## 2017-08-24 DIAGNOSIS — G8929 Other chronic pain: Secondary | ICD-10-CM | POA: Diagnosis not present

## 2017-08-24 DIAGNOSIS — I509 Heart failure, unspecified: Secondary | ICD-10-CM | POA: Diagnosis not present

## 2017-08-24 DIAGNOSIS — I119 Hypertensive heart disease without heart failure: Secondary | ICD-10-CM | POA: Diagnosis not present

## 2017-08-26 ENCOUNTER — Encounter (HOSPITAL_COMMUNITY): Payer: Self-pay | Admitting: Orthopedic Surgery

## 2017-08-30 DIAGNOSIS — M7652 Patellar tendinitis, left knee: Secondary | ICD-10-CM | POA: Diagnosis not present

## 2017-08-30 DIAGNOSIS — M25462 Effusion, left knee: Secondary | ICD-10-CM | POA: Diagnosis not present

## 2017-08-30 DIAGNOSIS — Z471 Aftercare following joint replacement surgery: Secondary | ICD-10-CM | POA: Diagnosis not present

## 2017-08-30 DIAGNOSIS — Z96652 Presence of left artificial knee joint: Secondary | ICD-10-CM | POA: Diagnosis not present

## 2017-09-17 DIAGNOSIS — I10 Essential (primary) hypertension: Secondary | ICD-10-CM | POA: Diagnosis not present

## 2017-09-17 DIAGNOSIS — M5136 Other intervertebral disc degeneration, lumbar region: Secondary | ICD-10-CM | POA: Diagnosis not present

## 2017-09-17 DIAGNOSIS — R51 Headache: Secondary | ICD-10-CM | POA: Diagnosis not present

## 2017-09-17 DIAGNOSIS — R002 Palpitations: Secondary | ICD-10-CM | POA: Diagnosis not present

## 2017-09-17 DIAGNOSIS — R0602 Shortness of breath: Secondary | ICD-10-CM | POA: Diagnosis not present

## 2017-09-19 DIAGNOSIS — R51 Headache: Secondary | ICD-10-CM | POA: Diagnosis not present

## 2017-10-17 DIAGNOSIS — G8929 Other chronic pain: Secondary | ICD-10-CM | POA: Diagnosis not present

## 2017-10-17 DIAGNOSIS — M25551 Pain in right hip: Secondary | ICD-10-CM | POA: Diagnosis not present

## 2017-10-21 DIAGNOSIS — M5136 Other intervertebral disc degeneration, lumbar region: Secondary | ICD-10-CM | POA: Diagnosis not present

## 2017-10-21 DIAGNOSIS — Z79899 Other long term (current) drug therapy: Secondary | ICD-10-CM | POA: Diagnosis not present

## 2017-10-30 DIAGNOSIS — Z79899 Other long term (current) drug therapy: Secondary | ICD-10-CM | POA: Diagnosis not present

## 2017-10-30 DIAGNOSIS — M5136 Other intervertebral disc degeneration, lumbar region: Secondary | ICD-10-CM | POA: Diagnosis not present

## 2017-11-13 DIAGNOSIS — Z79899 Other long term (current) drug therapy: Secondary | ICD-10-CM | POA: Diagnosis not present

## 2017-11-13 DIAGNOSIS — Z87898 Personal history of other specified conditions: Secondary | ICD-10-CM | POA: Diagnosis not present

## 2017-11-13 DIAGNOSIS — M5136 Other intervertebral disc degeneration, lumbar region: Secondary | ICD-10-CM | POA: Diagnosis not present

## 2017-11-15 ENCOUNTER — Other Ambulatory Visit: Payer: Self-pay | Admitting: Nurse Practitioner

## 2017-11-15 DIAGNOSIS — M5136 Other intervertebral disc degeneration, lumbar region: Secondary | ICD-10-CM

## 2017-11-23 ENCOUNTER — Ambulatory Visit
Admission: RE | Admit: 2017-11-23 | Discharge: 2017-11-23 | Disposition: A | Discharge status: Home / Self Care | Payer: Medicare Other | Source: Ambulatory Visit | Attending: Nurse Practitioner | Admitting: Nurse Practitioner

## 2017-11-23 DIAGNOSIS — M48061 Spinal stenosis, lumbar region without neurogenic claudication: Secondary | ICD-10-CM | POA: Diagnosis not present

## 2017-11-23 DIAGNOSIS — M5136 Other intervertebral disc degeneration, lumbar region: Secondary | ICD-10-CM

## 2017-11-27 DIAGNOSIS — M5136 Other intervertebral disc degeneration, lumbar region: Secondary | ICD-10-CM | POA: Diagnosis not present

## 2017-11-27 DIAGNOSIS — Z79899 Other long term (current) drug therapy: Secondary | ICD-10-CM | POA: Diagnosis not present

## 2017-12-06 DIAGNOSIS — M4316 Spondylolisthesis, lumbar region: Secondary | ICD-10-CM | POA: Diagnosis not present

## 2017-12-06 DIAGNOSIS — G8929 Other chronic pain: Secondary | ICD-10-CM | POA: Diagnosis not present

## 2017-12-06 DIAGNOSIS — Z6841 Body Mass Index (BMI) 40.0 and over, adult: Secondary | ICD-10-CM | POA: Diagnosis not present

## 2017-12-06 DIAGNOSIS — M5441 Lumbago with sciatica, right side: Secondary | ICD-10-CM | POA: Diagnosis not present

## 2017-12-06 DIAGNOSIS — R030 Elevated blood-pressure reading, without diagnosis of hypertension: Secondary | ICD-10-CM | POA: Diagnosis not present

## 2017-12-06 DIAGNOSIS — M48062 Spinal stenosis, lumbar region with neurogenic claudication: Secondary | ICD-10-CM | POA: Diagnosis not present

## 2017-12-06 DIAGNOSIS — M5416 Radiculopathy, lumbar region: Secondary | ICD-10-CM | POA: Diagnosis not present

## 2017-12-09 DIAGNOSIS — M48062 Spinal stenosis, lumbar region with neurogenic claudication: Secondary | ICD-10-CM | POA: Diagnosis not present

## 2017-12-09 DIAGNOSIS — M5416 Radiculopathy, lumbar region: Secondary | ICD-10-CM | POA: Diagnosis not present

## 2017-12-09 DIAGNOSIS — R030 Elevated blood-pressure reading, without diagnosis of hypertension: Secondary | ICD-10-CM | POA: Diagnosis not present

## 2017-12-09 DIAGNOSIS — G8929 Other chronic pain: Secondary | ICD-10-CM | POA: Diagnosis not present

## 2017-12-09 DIAGNOSIS — M5441 Lumbago with sciatica, right side: Secondary | ICD-10-CM | POA: Diagnosis not present

## 2017-12-09 DIAGNOSIS — M4316 Spondylolisthesis, lumbar region: Secondary | ICD-10-CM | POA: Diagnosis not present

## 2017-12-09 DIAGNOSIS — Z6841 Body Mass Index (BMI) 40.0 and over, adult: Secondary | ICD-10-CM | POA: Diagnosis not present

## 2017-12-11 DIAGNOSIS — M545 Low back pain: Secondary | ICD-10-CM | POA: Diagnosis not present

## 2017-12-11 DIAGNOSIS — Z79899 Other long term (current) drug therapy: Secondary | ICD-10-CM | POA: Diagnosis not present

## 2017-12-11 DIAGNOSIS — M5136 Other intervertebral disc degeneration, lumbar region: Secondary | ICD-10-CM | POA: Diagnosis not present

## 2017-12-11 DIAGNOSIS — G8929 Other chronic pain: Secondary | ICD-10-CM | POA: Diagnosis not present

## 2017-12-13 ENCOUNTER — Other Ambulatory Visit: Payer: Self-pay | Admitting: Neurosurgery

## 2017-12-13 DIAGNOSIS — M4316 Spondylolisthesis, lumbar region: Secondary | ICD-10-CM

## 2017-12-17 DIAGNOSIS — F314 Bipolar disorder, current episode depressed, severe, without psychotic features: Secondary | ICD-10-CM | POA: Diagnosis not present

## 2017-12-17 DIAGNOSIS — F4312 Post-traumatic stress disorder, chronic: Secondary | ICD-10-CM | POA: Diagnosis not present

## 2017-12-17 DIAGNOSIS — F411 Generalized anxiety disorder: Secondary | ICD-10-CM | POA: Diagnosis not present

## 2017-12-25 DIAGNOSIS — M545 Low back pain: Secondary | ICD-10-CM | POA: Diagnosis not present

## 2017-12-25 DIAGNOSIS — G8929 Other chronic pain: Secondary | ICD-10-CM | POA: Diagnosis not present

## 2017-12-25 DIAGNOSIS — M5136 Other intervertebral disc degeneration, lumbar region: Secondary | ICD-10-CM | POA: Diagnosis not present

## 2018-01-01 ENCOUNTER — Other Ambulatory Visit: Payer: Medicare Other

## 2018-01-01 ENCOUNTER — Inpatient Hospital Stay
Admission: RE | Admit: 2018-01-01 | Discharge: 2018-01-01 | Disposition: A | Discharge status: Home / Self Care | Payer: Medicare Other | Source: Ambulatory Visit | Attending: Neurosurgery | Admitting: Neurosurgery

## 2018-01-01 NOTE — Discharge Instructions (Signed)
Myelogram Discharge Instructions  1. Go home and rest quietly for the next 24 hours.  It is important to lie flat for the next 24 hours.  Get up only to go to the restroom.  You may lie in the bed or on a couch on your back, your stomach, your left side or your right side.  You may have one pillow under your head.  You may have pillows between your knees while you are on your side or under your knees while you are on your back.  2. DO NOT drive today.  Recline the seat as far back as it will go, while still wearing your seat belt, on the way home.  3. You may get up to go to the bathroom as needed.  You may sit up for 10 minutes to eat.  You may resume your normal diet and medications unless otherwise indicated.  Drink lots of extra fluids today and tomorrow.  4. The incidence of headache, nausea, or vomiting is about 5% (one in 20 patients).  If you develop a headache, lie flat and drink plenty of fluids until the headache goes away.  Caffeinated beverages may be helpful.  If you develop severe nausea and vomiting or a headache that does not go away with flat bed rest, call [phone].  5. You may resume normal activities after your 24 hours of bed rest is over; however, do not exert yourself strongly or do any heavy lifting tomorrow. If when you get up you have a headache when standing, go back to bed and force fluids for another 24 hours.  6. Call your physician for a follow-up appointment.  The results of your myelogram will be sent directly to your physician by the following day.  7. If you have any questions or if complications develop after you arrive home, please call [phone].  Discharge instructions have been explained to the patient.  The patient, or the person responsible for the patient, fully understands these instructions.     MAY RESUME BC'S AND GOODY'S TODAY.  MAY RESUME TRAZODONE, ABILIFY AND CYMBALTA ON January 02, 2018.

## 2018-01-13 ENCOUNTER — Other Ambulatory Visit: Payer: Medicare Other

## 2018-01-13 ENCOUNTER — Inpatient Hospital Stay
Admission: RE | Admit: 2018-01-13 | Discharge: 2018-01-13 | Disposition: A | Discharge status: Home / Self Care | Payer: Medicare Other | Source: Ambulatory Visit | Attending: Neurosurgery | Admitting: Neurosurgery

## 2018-01-13 DIAGNOSIS — K648 Other hemorrhoids: Secondary | ICD-10-CM | POA: Diagnosis not present

## 2018-01-13 DIAGNOSIS — Y999 Unspecified external cause status: Secondary | ICD-10-CM | POA: Diagnosis not present

## 2018-01-13 DIAGNOSIS — F1120 Opioid dependence, uncomplicated: Secondary | ICD-10-CM | POA: Insufficient documentation

## 2018-01-13 DIAGNOSIS — M5416 Radiculopathy, lumbar region: Secondary | ICD-10-CM | POA: Diagnosis not present

## 2018-01-13 DIAGNOSIS — I10 Essential (primary) hypertension: Secondary | ICD-10-CM | POA: Insufficient documentation

## 2018-01-13 DIAGNOSIS — N390 Urinary tract infection, site not specified: Secondary | ICD-10-CM | POA: Diagnosis not present

## 2018-01-13 DIAGNOSIS — X58XXXA Exposure to other specified factors, initial encounter: Secondary | ICD-10-CM | POA: Diagnosis not present

## 2018-01-13 DIAGNOSIS — K921 Melena: Secondary | ICD-10-CM | POA: Diagnosis not present

## 2018-01-13 DIAGNOSIS — R200 Anesthesia of skin: Secondary | ICD-10-CM | POA: Diagnosis not present

## 2018-01-13 DIAGNOSIS — S39012A Strain of muscle, fascia and tendon of lower back, initial encounter: Secondary | ICD-10-CM | POA: Diagnosis not present

## 2018-01-13 HISTORY — DX: Opioid dependence, uncomplicated: F11.20

## 2018-01-13 HISTORY — DX: Essential (primary) hypertension: I10

## 2018-01-13 NOTE — Discharge Instructions (Signed)
Myelogram Discharge Instructions  1. Go home and rest quietly for the next 24 hours.  It is important to lie flat for the next 24 hours.  Get up only to go to the restroom.  You may lie in the bed or on a couch on your back, your stomach, your left side or your right side.  You may have one pillow under your head.  You may have pillows between your knees while you are on your side or under your knees while you are on your back.  2. DO NOT drive today.  Recline the seat as far back as it will go, while still wearing your seat belt, on the way home.  3. You may get up to go to the bathroom as needed.  You may sit up for 10 minutes to eat.  You may resume your normal diet and medications unless otherwise indicated.  Drink lots of extra fluids today and tomorrow.  4. The incidence of headache, nausea, or vomiting is about 5% (one in 20 patients).  If you develop a headache, lie flat and drink plenty of fluids until the headache goes away.  Caffeinated beverages may be helpful.  If you develop severe nausea and vomiting or a headache that does not go away with flat bed rest, call [phone].  5. You may resume normal activities after your 24 hours of bed rest is over; however, do not exert yourself strongly or do any heavy lifting tomorrow. If when you get up you have a headache when standing, go back to bed and force fluids for another 24 hours.  6. Call your physician for a follow-up appointment.  The results of your myelogram will be sent directly to your physician by the following day.  7. If you have any questions or if complications develop after you arrive home, please call [phone].  Discharge instructions have been explained to the patient.  The patient, or the person responsible for the patient, fully understands these instructions.  YOU MAY RESTART YOUR TRAZODONE, CYMBALTA AND ABILIFY TOMORROW 01/14/2018 AT 1:00PM.

## 2018-01-21 ENCOUNTER — Inpatient Hospital Stay
Admission: RE | Admit: 2018-01-21 | Discharge: 2018-01-21 | Disposition: A | Discharge status: Home / Self Care | Payer: Medicare Other | Source: Ambulatory Visit | Attending: Neurosurgery | Admitting: Neurosurgery

## 2018-01-21 ENCOUNTER — Other Ambulatory Visit: Payer: Medicare Other

## 2018-01-21 NOTE — Discharge Instructions (Signed)

## 2018-01-23 DIAGNOSIS — Z471 Aftercare following joint replacement surgery: Secondary | ICD-10-CM | POA: Diagnosis not present

## 2018-01-23 DIAGNOSIS — R55 Syncope and collapse: Secondary | ICD-10-CM | POA: Diagnosis not present

## 2018-01-23 DIAGNOSIS — Z96651 Presence of right artificial knee joint: Secondary | ICD-10-CM | POA: Diagnosis not present

## 2018-01-23 DIAGNOSIS — S8391XA Sprain of unspecified site of right knee, initial encounter: Secondary | ICD-10-CM | POA: Diagnosis not present

## 2018-01-23 DIAGNOSIS — S060X1A Concussion with loss of consciousness of 30 minutes or less, initial encounter: Secondary | ICD-10-CM | POA: Diagnosis not present

## 2018-01-30 ENCOUNTER — Other Ambulatory Visit: Payer: Medicare Other

## 2018-01-30 ENCOUNTER — Inpatient Hospital Stay
Admission: RE | Admit: 2018-01-30 | Discharge: 2018-01-30 | Disposition: A | Discharge status: Home / Self Care | Payer: Medicare Other | Source: Ambulatory Visit | Attending: Neurosurgery | Admitting: Neurosurgery

## 2018-01-30 DIAGNOSIS — R0789 Other chest pain: Secondary | ICD-10-CM | POA: Diagnosis not present

## 2018-01-30 DIAGNOSIS — M25572 Pain in left ankle and joints of left foot: Secondary | ICD-10-CM | POA: Diagnosis not present

## 2018-01-30 DIAGNOSIS — M7989 Other specified soft tissue disorders: Secondary | ICD-10-CM | POA: Diagnosis not present

## 2018-01-30 DIAGNOSIS — I160 Hypertensive urgency: Secondary | ICD-10-CM | POA: Diagnosis not present

## 2018-01-30 DIAGNOSIS — R079 Chest pain, unspecified: Secondary | ICD-10-CM | POA: Diagnosis not present

## 2018-01-30 DIAGNOSIS — J9811 Atelectasis: Secondary | ICD-10-CM | POA: Diagnosis not present

## 2018-01-30 DIAGNOSIS — S8012XA Contusion of left lower leg, initial encounter: Secondary | ICD-10-CM | POA: Diagnosis not present

## 2018-01-30 NOTE — Discharge Instructions (Signed)
Myelogram Discharge Instructions  1. Go home and rest quietly for the next 24 hours.  It is important to lie flat for the next 24 hours.  Get up only to go to the restroom.  You may lie in the bed or on a couch on your back, your stomach, your left side or your right side.  You may have one pillow under your head.  You may have pillows between your knees while you are on your side or under your knees while you are on your back.  2. DO NOT drive today.  Recline the seat as far back as it will go, while still wearing your seat belt, on the way home.  3. You may get up to go to the bathroom as needed.  You may sit up for 10 minutes to eat.  You may resume your normal diet and medications unless otherwise indicated.  Drink lots of extra fluids today and tomorrow.  4. The incidence of headache, nausea, or vomiting is about 5% (one in 20 patients).  If you develop a headache, lie flat and drink plenty of fluids until the headache goes away.  Caffeinated beverages may be helpful.  If you develop severe nausea and vomiting or a headache that does not go away with flat bed rest, call [phone].  5. You may resume normal activities after your 24 hours of bed rest is over; however, do not exert yourself strongly or do any heavy lifting tomorrow. If when you get up you have a headache when standing, go back to bed and force fluids for another 24 hours.  6. Call your physician for a follow-up appointment.  The results of your myelogram will be sent directly to your physician by the following day.  7. If you have any questions or if complications develop after you arrive home, please call [phone].  Discharge instructions have been explained to the patient.  The patient, or the person responsible for the patient, fully understands these instructions.  YOU MAY RESTART YOUR TRAZODONE, CYMBALTA AND ABILIFY TOMORROW 01/31/2018 AT 1:00PM.

## 2018-02-05 DIAGNOSIS — R0602 Shortness of breath: Secondary | ICD-10-CM | POA: Diagnosis not present

## 2018-02-05 DIAGNOSIS — Z79899 Other long term (current) drug therapy: Secondary | ICD-10-CM | POA: Diagnosis not present

## 2018-02-05 DIAGNOSIS — R600 Localized edema: Secondary | ICD-10-CM | POA: Diagnosis not present

## 2018-02-05 DIAGNOSIS — E876 Hypokalemia: Secondary | ICD-10-CM | POA: Diagnosis not present

## 2018-02-05 DIAGNOSIS — F319 Bipolar disorder, unspecified: Secondary | ICD-10-CM | POA: Diagnosis not present

## 2018-02-05 DIAGNOSIS — I169 Hypertensive crisis, unspecified: Secondary | ICD-10-CM | POA: Diagnosis not present

## 2018-02-05 DIAGNOSIS — R5381 Other malaise: Secondary | ICD-10-CM | POA: Diagnosis not present

## 2018-02-05 DIAGNOSIS — E041 Nontoxic single thyroid nodule: Secondary | ICD-10-CM | POA: Diagnosis not present

## 2018-02-05 DIAGNOSIS — E669 Obesity, unspecified: Secondary | ICD-10-CM | POA: Diagnosis not present

## 2018-02-05 DIAGNOSIS — E7800 Pure hypercholesterolemia, unspecified: Secondary | ICD-10-CM | POA: Diagnosis not present

## 2018-02-05 DIAGNOSIS — I160 Hypertensive urgency: Secondary | ICD-10-CM | POA: Diagnosis not present

## 2018-02-05 DIAGNOSIS — R0789 Other chest pain: Secondary | ICD-10-CM | POA: Diagnosis not present

## 2018-02-05 DIAGNOSIS — R609 Edema, unspecified: Secondary | ICD-10-CM | POA: Diagnosis not present

## 2018-02-05 DIAGNOSIS — R079 Chest pain, unspecified: Secondary | ICD-10-CM | POA: Diagnosis not present

## 2018-02-05 DIAGNOSIS — I252 Old myocardial infarction: Secondary | ICD-10-CM | POA: Diagnosis not present

## 2018-02-06 DIAGNOSIS — R079 Chest pain, unspecified: Secondary | ICD-10-CM | POA: Diagnosis not present

## 2018-02-06 DIAGNOSIS — I160 Hypertensive urgency: Secondary | ICD-10-CM | POA: Diagnosis not present

## 2018-02-06 DIAGNOSIS — I10 Essential (primary) hypertension: Secondary | ICD-10-CM | POA: Diagnosis not present

## 2018-02-06 DIAGNOSIS — I517 Cardiomegaly: Secondary | ICD-10-CM | POA: Diagnosis not present

## 2018-02-14 ENCOUNTER — Ambulatory Visit
Admission: RE | Admit: 2018-02-14 | Discharge: 2018-02-14 | Disposition: A | Discharge status: Home / Self Care | Payer: Medicare Other | Source: Ambulatory Visit | Attending: Neurosurgery | Admitting: Neurosurgery

## 2018-02-14 DIAGNOSIS — M4807 Spinal stenosis, lumbosacral region: Secondary | ICD-10-CM | POA: Diagnosis not present

## 2018-02-14 DIAGNOSIS — M4316 Spondylolisthesis, lumbar region: Secondary | ICD-10-CM

## 2018-02-14 MED ORDER — IOPAMIDOL (ISOVUE-M 200) INJECTION 41%
15.0000 mL | Freq: Once | INTRAMUSCULAR | Status: AC
  Administered 2018-02-14: 15 mL via INTRATHECAL

## 2018-02-14 MED ORDER — DIAZEPAM 5 MG PO TABS
10.0000 mg | ORAL_TABLET | Freq: Once | ORAL | Status: AC
  Administered 2018-02-14: 10 mg via ORAL

## 2018-02-14 MED ORDER — ONDANSETRON HCL 4 MG/2ML IJ SOLN
4.0000 mg | Freq: Once | INTRAMUSCULAR | Status: AC
  Administered 2018-02-14: 4 mg via INTRAMUSCULAR

## 2018-02-14 MED ORDER — MEPERIDINE HCL 100 MG/ML IJ SOLN
75.0000 mg | Freq: Once | INTRAMUSCULAR | Status: AC
  Administered 2018-02-14: 75 mg via INTRAMUSCULAR

## 2018-02-14 NOTE — Discharge Instructions (Signed)
Myelogram Discharge Instructions  1. Go home and rest quietly for the next 24 hours.  It is important to lie flat for the next 24 hours.  Get up only to go to the restroom.  You may lie in the bed or on a couch on your back, your stomach, your left side or your right side.  You may have one pillow under your head.  You may have pillows between your knees while you are on your side or under your knees while you are on your back.  2. DO NOT drive today.  Recline the seat as far back as it will go, while still wearing your seat belt, on the way home.  3. You may get up to go to the bathroom as needed.  You may sit up for 10 minutes to eat.  You may resume your normal diet and medications unless otherwise indicated.  Drink lots of extra fluids today and tomorrow.  4. The incidence of headache, nausea, or vomiting is about 5% (one in 20 patients).  If you develop a headache, lie flat and drink plenty of fluids until the headache goes away.  Caffeinated beverages may be helpful.  If you develop severe nausea and vomiting or a headache that does not go away with flat bed rest, call [phone].  5. You may resume normal activities after your 24 hours of bed rest is over; however, do not exert yourself strongly or do any heavy lifting tomorrow. If when you get up you have a headache when standing, go back to bed and force fluids for another 24 hours.  6. Call your physician for a follow-up appointment.  The results of your myelogram will be sent directly to your physician by the following day.  7. If you have any questions or if complications develop after you arrive home, please call [phone].  Discharge instructions have been explained to the patient.  The patient, or the person responsible for the patient, fully understands these instructions.  YOU MAY RESTART YOUR TRAZODONE, CYMBALTA AND ABILIFY TOMORROW 02/15/2018 AT 1:00PM.

## 2018-02-18 DIAGNOSIS — F314 Bipolar disorder, current episode depressed, severe, without psychotic features: Secondary | ICD-10-CM | POA: Diagnosis not present

## 2018-02-18 DIAGNOSIS — F411 Generalized anxiety disorder: Secondary | ICD-10-CM | POA: Diagnosis not present

## 2018-02-18 DIAGNOSIS — F4312 Post-traumatic stress disorder, chronic: Secondary | ICD-10-CM | POA: Diagnosis not present

## 2018-02-19 DIAGNOSIS — F411 Generalized anxiety disorder: Secondary | ICD-10-CM | POA: Diagnosis not present

## 2018-02-19 DIAGNOSIS — F331 Major depressive disorder, recurrent, moderate: Secondary | ICD-10-CM | POA: Diagnosis not present

## 2018-03-10 DIAGNOSIS — I10 Essential (primary) hypertension: Secondary | ICD-10-CM | POA: Diagnosis not present

## 2018-03-10 DIAGNOSIS — M9983 Other biomechanical lesions of lumbar region: Secondary | ICD-10-CM | POA: Diagnosis not present

## 2018-03-10 DIAGNOSIS — M4316 Spondylolisthesis, lumbar region: Secondary | ICD-10-CM | POA: Diagnosis not present

## 2018-03-10 DIAGNOSIS — Z6841 Body Mass Index (BMI) 40.0 and over, adult: Secondary | ICD-10-CM | POA: Diagnosis not present

## 2018-03-13 ENCOUNTER — Other Ambulatory Visit: Payer: Self-pay | Admitting: Neurosurgery

## 2018-04-01 ENCOUNTER — Other Ambulatory Visit: Payer: Self-pay | Admitting: Neurosurgery

## 2018-04-07 NOTE — Pre-Procedure Instructions (Signed)
DAROTHY CHAPPELLE  04/07/2018      [facility] - [place], [place] - [address]  [place] [address] Phone: [phone] Fax: [phone]    Your procedure is scheduled on 04/16/18.  Report to [facility] Admitting at 530 A.M.  Call this number if you have problems the morning of surgery:  [phone]   Remember:  Do not eat or drink after midnight.      Take these medicines the morning of surgery with A SIP OF WATER -----tylenol,norvasc,abilify,cymbalta,trileptal    Do not wear jewelry, make-up or nail polish.  Do not wear lotions, powders, or perfumes, or deodorant.  Do not shave 48 hours prior to surgery.  Men may shave face and neck.  Do not bring valuables to the hospital.  [facility] is not responsible for any belongings or valuables.  Contacts, dentures or bridgework may not be worn into surgery.  Leave your suitcase in the car.  After surgery it may be brought to your room.  For patients admitted to the hospital, discharge time will be determined by your treatment team.  Patients discharged the day of surgery will not be allowed to drive home.   Name and phone number of your driver:   Do not take any aspirin,anti-inflammatories,vitamins,or herbal supplements 5-7 days prior to surgery. Special instructions:  [place] - Preparing for Surgery  Before surgery, you can play an important role.  Because skin is not sterile, your skin needs to be as free of germs as possible.  You can reduce the number of germs on you skin by washing with CHG (chlorahexidine gluconate) soap before surgery.  CHG is an antiseptic cleaner which kills germs and bonds with the skin to continue killing germs even after washing.  Oral Hygiene is also important in reducing the risk of infection.  Remember to brush your teeth with your regular toothpaste the morning of surgery.  Please DO NOT use if you have an allergy to CHG or antibacterial soaps.  If your  skin becomes reddened/irritated stop using the CHG and inform your nurse when you arrive at Short Stay.  Do not shave (including legs and underarms) for at least 48 hours prior to the first CHG shower.  You may shave your face.  Please follow these instructions carefully:   1.  Shower with CHG Soap the night before surgery and the morning of Surgery.  2.  If you choose to wash your hair, wash your hair first as usual with your normal shampoo.  3.  After you shampoo, rinse your hair and body thoroughly to remove the shampoo. 4.  Use CHG as you would any other liquid soap.  You can apply chg directly to the skin and wash gently with a      scrungie or washcloth.           5.  Apply the CHG Soap to your body ONLY FROM THE NECK DOWN.   Do not use on open wounds or open sores. Avoid contact with your eyes, ears, mouth and genitals (private parts).  Wash genitals (private parts) with your normal soap.  6.  Wash thoroughly, paying special attention to the area where your surgery will be performed.  7.  Thoroughly rinse your body with warm water from the neck down.  8.  DO NOT shower/wash with your normal soap after using and rinsing off the CHG Soap.  9.  Pat yourself dry  with a clean towel.            10.  Wear clean pajamas.            11.  Place clean sheets on your bed the night of your first shower and do not sleep with pets.  Day of Surgery  Do not apply any lotions/deoderants the morning of surgery.   Please wear clean clothes to the hospital/surgery center. Remember to brush your teeth with toothpaste.    Please read over the following fact sheets that you were given. MRSA Information

## 2018-04-08 ENCOUNTER — Encounter (HOSPITAL_COMMUNITY): Payer: Self-pay

## 2018-04-08 ENCOUNTER — Encounter (HOSPITAL_COMMUNITY)
Admission: RE | Admit: 2018-04-08 | Discharge: 2018-04-08 | Disposition: A | Discharge status: Home / Self Care | Payer: Medicare Other | Source: Ambulatory Visit | Attending: Neurosurgery | Admitting: Neurosurgery

## 2018-04-08 DIAGNOSIS — I10 Essential (primary) hypertension: Secondary | ICD-10-CM | POA: Insufficient documentation

## 2018-04-08 DIAGNOSIS — Z01812 Encounter for preprocedural laboratory examination: Secondary | ICD-10-CM | POA: Diagnosis not present

## 2018-04-08 DIAGNOSIS — Z01810 Encounter for preprocedural cardiovascular examination: Secondary | ICD-10-CM | POA: Insufficient documentation

## 2018-04-08 HISTORY — DX: Dyspnea, unspecified: R06.00

## 2018-04-08 LAB — BASIC METABOLIC PANEL
Anion gap: 7 (ref 5–15)
BUN: 8 mg/dL (ref 6–20)
CALCIUM: 9.1 mg/dL (ref 8.9–10.3)
CO2: 26 mmol/L (ref 22–32)
CREATININE: 0.79 mg/dL (ref 0.44–1.00)
Chloride: 104 mmol/L (ref 101–111)
GFR calc Af Amer: >60 mL/min (ref >60)
GLUCOSE: 100 mg/dL — AB (ref 65–99)
Potassium: 4 mmol/L (ref 3.5–5.1)
SODIUM: 137 mmol/L (ref 135–145)

## 2018-04-08 LAB — CBC
HCT: 40.8 % (ref 36.0–46.0)
Hemoglobin: 13.4 g/dL (ref 12.0–15.0)
MCH: 29.5 pg (ref 26.0–34.0)
MCHC: 32.8 g/dL (ref 30.0–36.0)
MCV: 89.7 fL (ref 78.0–100.0)
PLATELETS: 304 10*3/uL (ref 150–400)
RBC: 4.55 MIL/uL (ref 3.87–5.11)
RDW: 12.2 % (ref 11.5–15.5)
WBC: 9.6 10*3/uL (ref 4.0–10.5)

## 2018-04-08 LAB — SURGICAL PCR SCREEN
MRSA, PCR: NEGATIVE
Staphylococcus aureus: NEGATIVE

## 2018-04-08 LAB — TYPE AND SCREEN
ABO/RH(D): O POS
Antibody Screen: NEGATIVE

## 2018-04-08 LAB — ABO/RH: ABO/RH(D): O POS

## 2018-04-08 MED ORDER — CHLORHEXIDINE GLUCONATE CLOTH 2 % EX PADS: 6.0000 | MEDICATED_PAD | Freq: Once | CUTANEOUS | Status: DC

## 2018-04-09 DIAGNOSIS — H6122 Impacted cerumen, left ear: Secondary | ICD-10-CM | POA: Diagnosis not present

## 2018-04-09 DIAGNOSIS — H9202 Otalgia, left ear: Secondary | ICD-10-CM | POA: Diagnosis not present

## 2018-04-15 MED ORDER — DEXTROSE 5 % IV SOLN
3.0000 g | INTRAVENOUS | Status: AC
  Administered 2018-04-16 (×2): 3 g via INTRAVENOUS
  Filled 2018-04-15: qty 3

## 2018-04-16 ENCOUNTER — Encounter (HOSPITAL_COMMUNITY)
Admission: RE | Disposition: A | Discharge status: Home Health | Payer: Self-pay | Source: Home / Self Care | Attending: Neurosurgery

## 2018-04-16 ENCOUNTER — Inpatient Hospital Stay (HOSPITAL_COMMUNITY): Payer: Medicare Other

## 2018-04-16 ENCOUNTER — Inpatient Hospital Stay (HOSPITAL_COMMUNITY): Payer: Medicare Other | Admitting: Certified Registered"

## 2018-04-16 ENCOUNTER — Encounter (HOSPITAL_COMMUNITY): Payer: Self-pay

## 2018-04-16 ENCOUNTER — Inpatient Hospital Stay (HOSPITAL_COMMUNITY)
Admission: RE | Admit: 2018-04-16 | Discharge: 2018-04-18 | DRG: 454 | Disposition: A | Discharge status: Home Health | Payer: Medicare Other | Attending: Neurosurgery | Admitting: Neurosurgery

## 2018-04-16 DIAGNOSIS — M4316 Spondylolisthesis, lumbar region: Secondary | ICD-10-CM | POA: Diagnosis present

## 2018-04-16 DIAGNOSIS — Z7982 Long term (current) use of aspirin: Secondary | ICD-10-CM

## 2018-04-16 DIAGNOSIS — M5117 Intervertebral disc disorders with radiculopathy, lumbosacral region: Secondary | ICD-10-CM | POA: Diagnosis not present

## 2018-04-16 DIAGNOSIS — M4326 Fusion of spine, lumbar region: Secondary | ICD-10-CM | POA: Diagnosis not present

## 2018-04-16 DIAGNOSIS — M5116 Intervertebral disc disorders with radiculopathy, lumbar region: Secondary | ICD-10-CM | POA: Diagnosis present

## 2018-04-16 DIAGNOSIS — Z96653 Presence of artificial knee joint, bilateral: Secondary | ICD-10-CM | POA: Diagnosis present

## 2018-04-16 DIAGNOSIS — M4727 Other spondylosis with radiculopathy, lumbosacral region: Secondary | ICD-10-CM | POA: Diagnosis present

## 2018-04-16 DIAGNOSIS — M48062 Spinal stenosis, lumbar region with neurogenic claudication: Secondary | ICD-10-CM | POA: Diagnosis not present

## 2018-04-16 DIAGNOSIS — M48061 Spinal stenosis, lumbar region without neurogenic claudication: Secondary | ICD-10-CM | POA: Diagnosis not present

## 2018-04-16 DIAGNOSIS — Z419 Encounter for procedure for purposes other than remedying health state, unspecified: Secondary | ICD-10-CM

## 2018-04-16 DIAGNOSIS — Z6841 Body Mass Index (BMI) 40.0 and over, adult: Secondary | ICD-10-CM | POA: Diagnosis not present

## 2018-04-16 DIAGNOSIS — I428 Other cardiomyopathies: Secondary | ICD-10-CM | POA: Diagnosis present

## 2018-04-16 DIAGNOSIS — I509 Heart failure, unspecified: Secondary | ICD-10-CM | POA: Diagnosis present

## 2018-04-16 DIAGNOSIS — F329 Major depressive disorder, single episode, unspecified: Secondary | ICD-10-CM | POA: Diagnosis not present

## 2018-04-16 DIAGNOSIS — I110 Hypertensive heart disease with heart failure: Secondary | ICD-10-CM | POA: Diagnosis present

## 2018-04-16 DIAGNOSIS — M4726 Other spondylosis with radiculopathy, lumbar region: Secondary | ICD-10-CM | POA: Diagnosis not present

## 2018-04-16 DIAGNOSIS — Z79899 Other long term (current) drug therapy: Secondary | ICD-10-CM

## 2018-04-16 DIAGNOSIS — M549 Dorsalgia, unspecified: Secondary | ICD-10-CM | POA: Diagnosis not present

## 2018-04-16 HISTORY — DX: Spondylolisthesis, lumbar region: M43.16

## 2018-04-16 SURGERY — POSTERIOR LUMBAR FUSION 2 LEVEL
Anesthesia: General | Site: Spine Lumbar

## 2018-04-16 MED ORDER — MENTHOL 3 MG MT LOZG: 1.0000 | LOZENGE | OROMUCOSAL | Status: DC | PRN

## 2018-04-16 MED ORDER — FENTANYL CITRATE (PF) 250 MCG/5ML IJ SOLN
INTRAMUSCULAR | Status: AC
  Filled 2018-04-16: qty 5

## 2018-04-16 MED ORDER — BUPIVACAINE-EPINEPHRINE (PF) 0.5% -1:200000 IJ SOLN
INTRAMUSCULAR | Status: DC | PRN
  Administered 2018-04-16: 10 mL

## 2018-04-16 MED ORDER — PROPOFOL 10 MG/ML IV BOLUS
INTRAVENOUS | Status: AC
  Filled 2018-04-16: qty 20

## 2018-04-16 MED ORDER — ATENOLOL-CHLORTHALIDONE 100-25 MG PO TABS: 1.0000 | ORAL_TABLET | Freq: Every day | ORAL | Status: DC

## 2018-04-16 MED ORDER — CHLORTHALIDONE 25 MG PO TABS
25.0000 mg | ORAL_TABLET | Freq: Every day | ORAL | Status: DC
  Administered 2018-04-17: 25 mg via ORAL
  Filled 2018-04-16 (×2): qty 1

## 2018-04-16 MED ORDER — THROMBIN 5000 UNITS EX SOLR
CUTANEOUS | Status: AC
  Filled 2018-04-16: qty 5000

## 2018-04-16 MED ORDER — LACTATED RINGERS IV SOLN
INTRAVENOUS | Status: DC | PRN
  Administered 2018-04-16 (×2): via INTRAVENOUS

## 2018-04-16 MED ORDER — HYDROMORPHONE HCL 1 MG/ML IJ SOLN
INTRAMUSCULAR | Status: AC
  Filled 2018-04-16: qty 1

## 2018-04-16 MED ORDER — VANCOMYCIN HCL 1000 MG IV SOLR
INTRAVENOUS | Status: AC
  Filled 2018-04-16: qty 1000

## 2018-04-16 MED ORDER — TRAZODONE HCL 100 MG PO TABS
200.0000 mg | ORAL_TABLET | Freq: Every day | ORAL | Status: DC
Start: 2018-04-16 — End: 2018-04-18
  Administered 2018-04-16 – 2018-04-17 (×2): 200 mg via ORAL
  Filled 2018-04-16 (×3): qty 2

## 2018-04-16 MED ORDER — SUGAMMADEX SODIUM 200 MG/2ML IV SOLN
INTRAVENOUS | Status: AC
  Filled 2018-04-16: qty 2

## 2018-04-16 MED ORDER — BISACODYL 10 MG RE SUPP: 10.0000 mg | Freq: Every day | RECTAL | Status: DC | PRN

## 2018-04-16 MED ORDER — ONDANSETRON HCL 4 MG/2ML IJ SOLN
INTRAMUSCULAR | Status: DC | PRN
Start: 2018-04-16 — End: 2018-04-16
  Administered 2018-04-16: 4 mg via INTRAVENOUS

## 2018-04-16 MED ORDER — MIDAZOLAM HCL 2 MG/2ML IJ SOLN
INTRAMUSCULAR | Status: AC
  Filled 2018-04-16: qty 2

## 2018-04-16 MED ORDER — DULOXETINE HCL 30 MG PO CPEP
120.0000 mg | ORAL_CAPSULE | Freq: Every day | ORAL | Status: DC
  Administered 2018-04-17 – 2018-04-18 (×2): 120 mg via ORAL
  Filled 2018-04-16 (×2): qty 4

## 2018-04-16 MED ORDER — THROMBIN 20000 UNITS EX SOLR
CUTANEOUS | Status: AC
  Filled 2018-04-16: qty 20000

## 2018-04-16 MED ORDER — ROCURONIUM BROMIDE 50 MG/5ML IV SOLN
INTRAVENOUS | Status: AC
  Filled 2018-04-16: qty 1

## 2018-04-16 MED ORDER — ARIPIPRAZOLE 10 MG PO TABS
15.0000 mg | ORAL_TABLET | Freq: Every day | ORAL | Status: DC
  Administered 2018-04-17 – 2018-04-18 (×2): 15 mg via ORAL
  Filled 2018-04-16 (×2): qty 1

## 2018-04-16 MED ORDER — SUCCINYLCHOLINE CHLORIDE 200 MG/10ML IV SOSY
PREFILLED_SYRINGE | INTRAVENOUS | Status: AC
  Filled 2018-04-16: qty 10

## 2018-04-16 MED ORDER — ZOLPIDEM TARTRATE 5 MG PO TABS: 5.0000 mg | ORAL_TABLET | Freq: Every evening | ORAL | Status: DC | PRN

## 2018-04-16 MED ORDER — BUPIVACAINE LIPOSOME 1.3 % IJ SUSP
20.0000 mL | INTRAMUSCULAR | Status: AC
  Administered 2018-04-16: 20 mL
  Filled 2018-04-16: qty 20

## 2018-04-16 MED ORDER — ONDANSETRON HCL 4 MG/2ML IJ SOLN
INTRAMUSCULAR | Status: AC
  Filled 2018-04-16: qty 2

## 2018-04-16 MED ORDER — THROMBIN 5000 UNITS EX SOLR
OROMUCOSAL | Status: DC | PRN
  Administered 2018-04-16 (×2): 5 mL via TOPICAL

## 2018-04-16 MED ORDER — FENTANYL CITRATE (PF) 100 MCG/2ML IJ SOLN
INTRAMUSCULAR | Status: DC | PRN
  Administered 2018-04-16 (×4): 50 ug via INTRAVENOUS
  Administered 2018-04-16: 100 ug via INTRAVENOUS
  Administered 2018-04-16 (×2): 50 ug via INTRAVENOUS

## 2018-04-16 MED ORDER — CYCLOBENZAPRINE HCL 10 MG PO TABS
ORAL_TABLET | ORAL | Status: AC
  Filled 2018-04-16: qty 1

## 2018-04-16 MED ORDER — LIDOCAINE 2% (20 MG/ML) 5 ML SYRINGE
INTRAMUSCULAR | Status: AC
  Filled 2018-04-16: qty 5

## 2018-04-16 MED ORDER — CYCLOBENZAPRINE HCL 10 MG PO TABS
10.0000 mg | ORAL_TABLET | Freq: Three times a day (TID) | ORAL | Status: DC | PRN
  Administered 2018-04-16 – 2018-04-17 (×4): 10 mg via ORAL
  Filled 2018-04-16 (×3): qty 1

## 2018-04-16 MED ORDER — FENTANYL CITRATE (PF) 100 MCG/2ML IJ SOLN
INTRAMUSCULAR | Status: AC
  Filled 2018-04-16: qty 2

## 2018-04-16 MED ORDER — SODIUM CHLORIDE 0.9 % IV SOLN: 250.0000 mL | INTRAVENOUS | Status: DC

## 2018-04-16 MED ORDER — SUGAMMADEX SODIUM 200 MG/2ML IV SOLN
INTRAVENOUS | Status: DC | PRN
  Administered 2018-04-16: 236.8 mg via INTRAVENOUS

## 2018-04-16 MED ORDER — OXYCODONE HCL 5 MG PO TABS
10.0000 mg | ORAL_TABLET | ORAL | Status: DC | PRN
  Administered 2018-04-16 – 2018-04-18 (×15): 10 mg via ORAL
  Filled 2018-04-16 (×14): qty 2

## 2018-04-16 MED ORDER — ACETAMINOPHEN 325 MG PO TABS
650.0000 mg | ORAL_TABLET | ORAL | Status: DC | PRN
  Administered 2018-04-17 – 2018-04-18 (×4): 650 mg via ORAL
  Filled 2018-04-16 (×5): qty 2

## 2018-04-16 MED ORDER — SODIUM CHLORIDE 0.9 % IV SOLN
INTRAVENOUS | Status: DC | PRN
  Administered 2018-04-16: 500 mL

## 2018-04-16 MED ORDER — THROMBIN 20000 UNITS EX SOLR
CUTANEOUS | Status: DC | PRN
  Administered 2018-04-16: 20 mL via TOPICAL

## 2018-04-16 MED ORDER — DOCUSATE SODIUM 100 MG PO CAPS
100.0000 mg | ORAL_CAPSULE | Freq: Two times a day (BID) | ORAL | Status: DC
  Administered 2018-04-16 – 2018-04-18 (×4): 100 mg via ORAL
  Filled 2018-04-16 (×4): qty 1

## 2018-04-16 MED ORDER — DEXAMETHASONE SODIUM PHOSPHATE 10 MG/ML IJ SOLN
INTRAMUSCULAR | Status: AC
  Filled 2018-04-16: qty 1

## 2018-04-16 MED ORDER — MEPERIDINE HCL 50 MG/ML IJ SOLN: 6.2500 mg | INTRAMUSCULAR | Status: DC | PRN

## 2018-04-16 MED ORDER — ACETAMINOPHEN 650 MG RE SUPP: 650.0000 mg | RECTAL | Status: DC | PRN

## 2018-04-16 MED ORDER — PROPOFOL 10 MG/ML IV BOLUS
INTRAVENOUS | Status: DC | PRN
  Administered 2018-04-16: 150 mg via INTRAVENOUS

## 2018-04-16 MED ORDER — HYDROMORPHONE HCL 1 MG/ML IJ SOLN
0.5000 mg | INTRAMUSCULAR | Status: DC | PRN
  Administered 2018-04-16 (×6): 0.5 mg via INTRAVENOUS

## 2018-04-16 MED ORDER — AMLODIPINE BESYLATE 5 MG PO TABS
2.5000 mg | ORAL_TABLET | Freq: Every day | ORAL | Status: DC
  Administered 2018-04-17: 2.5 mg via ORAL
  Filled 2018-04-16: qty 1

## 2018-04-16 MED ORDER — OXYCODONE HCL 5 MG PO TABS: 5.0000 mg | ORAL_TABLET | ORAL | Status: DC | PRN

## 2018-04-16 MED ORDER — MORPHINE SULFATE (PF) 4 MG/ML IV SOLN
4.0000 mg | INTRAVENOUS | Status: DC | PRN
  Administered 2018-04-17 – 2018-04-18 (×2): 4 mg via INTRAVENOUS
  Filled 2018-04-16 (×2): qty 1

## 2018-04-16 MED ORDER — ARTIFICIAL TEARS OPHTHALMIC OINT
TOPICAL_OINTMENT | OPHTHALMIC | Status: DC | PRN
  Administered 2018-04-16: 1 via OPHTHALMIC

## 2018-04-16 MED ORDER — OXCARBAZEPINE 300 MG PO TABS
1200.0000 mg | ORAL_TABLET | Freq: Every day | ORAL | Status: DC
  Administered 2018-04-17 – 2018-04-18 (×2): 1200 mg via ORAL
  Filled 2018-04-16 (×2): qty 4

## 2018-04-16 MED ORDER — FENTANYL CITRATE (PF) 100 MCG/2ML IJ SOLN
25.0000 ug | INTRAMUSCULAR | Status: DC | PRN
Start: 2018-04-16 — End: 2018-04-16
  Administered 2018-04-16 (×3): 50 ug via INTRAVENOUS

## 2018-04-16 MED ORDER — LIDOCAINE HCL (CARDIAC) PF 100 MG/5ML IV SOSY
PREFILLED_SYRINGE | INTRAVENOUS | Status: DC | PRN
  Administered 2018-04-16: 100 mg via INTRAVENOUS

## 2018-04-16 MED ORDER — MIDAZOLAM HCL 5 MG/5ML IJ SOLN
INTRAMUSCULAR | Status: DC | PRN
  Administered 2018-04-16: 2 mg via INTRAVENOUS

## 2018-04-16 MED ORDER — OXYCODONE HCL 5 MG PO TABS
ORAL_TABLET | ORAL | Status: AC
  Filled 2018-04-16: qty 2

## 2018-04-16 MED ORDER — DEXAMETHASONE SODIUM PHOSPHATE 10 MG/ML IJ SOLN
INTRAMUSCULAR | Status: DC | PRN
  Administered 2018-04-16: 10 mg via INTRAVENOUS

## 2018-04-16 MED ORDER — BUPIVACAINE-EPINEPHRINE (PF) 0.5% -1:200000 IJ SOLN
INTRAMUSCULAR | Status: AC
  Filled 2018-04-16: qty 30

## 2018-04-16 MED ORDER — ONDANSETRON HCL 4 MG/2ML IJ SOLN: 4.0000 mg | Freq: Four times a day (QID) | INTRAMUSCULAR | Status: DC | PRN

## 2018-04-16 MED ORDER — ACETAMINOPHEN 500 MG PO TABS
1000.0000 mg | ORAL_TABLET | Freq: Four times a day (QID) | ORAL | Status: AC
  Administered 2018-04-16 – 2018-04-17 (×4): 1000 mg via ORAL
  Filled 2018-04-16 (×4): qty 2

## 2018-04-16 MED ORDER — METOCLOPRAMIDE HCL 5 MG/ML IJ SOLN: 10.0000 mg | Freq: Once | INTRAMUSCULAR | Status: DC | PRN

## 2018-04-16 MED ORDER — SODIUM CHLORIDE 0.9% FLUSH: 3.0000 mL | Freq: Two times a day (BID) | INTRAVENOUS | Status: DC

## 2018-04-16 MED ORDER — LACTATED RINGERS IV SOLN: INTRAVENOUS | Status: DC

## 2018-04-16 MED ORDER — 0.9 % SODIUM CHLORIDE (POUR BTL) OPTIME
TOPICAL | Status: DC | PRN
  Administered 2018-04-16: 1000 mL

## 2018-04-16 MED ORDER — HYDROCODONE-ACETAMINOPHEN 10-325 MG PO TABS: 1.0000 | ORAL_TABLET | ORAL | Status: DC | PRN

## 2018-04-16 MED ORDER — PHENYLEPHRINE 40 MCG/ML (10ML) SYRINGE FOR IV PUSH (FOR BLOOD PRESSURE SUPPORT)
PREFILLED_SYRINGE | INTRAVENOUS | Status: AC
  Filled 2018-04-16: qty 10

## 2018-04-16 MED ORDER — ONDANSETRON HCL 4 MG PO TABS: 4.0000 mg | ORAL_TABLET | Freq: Four times a day (QID) | ORAL | Status: DC | PRN

## 2018-04-16 MED ORDER — PHENOL 1.4 % MT LIQD: 1.0000 | OROMUCOSAL | Status: DC | PRN

## 2018-04-16 MED ORDER — BACITRACIN ZINC 500 UNIT/GM EX OINT
TOPICAL_OINTMENT | CUTANEOUS | Status: DC | PRN
  Administered 2018-04-16: 1 via TOPICAL

## 2018-04-16 MED ORDER — ROCURONIUM BROMIDE 100 MG/10ML IV SOLN
INTRAVENOUS | Status: DC | PRN
  Administered 2018-04-16: 20 mg via INTRAVENOUS
  Administered 2018-04-16: 30 mg via INTRAVENOUS
  Administered 2018-04-16: 50 mg via INTRAVENOUS
  Administered 2018-04-16: 10 mg via INTRAVENOUS
  Administered 2018-04-16: 30 mg via INTRAVENOUS

## 2018-04-16 MED ORDER — BACITRACIN ZINC 500 UNIT/GM EX OINT
TOPICAL_OINTMENT | CUTANEOUS | Status: AC
  Filled 2018-04-16: qty 28.35

## 2018-04-16 MED ORDER — VANCOMYCIN HCL 1 G IV SOLR
INTRAVENOUS | Status: DC | PRN
  Administered 2018-04-16: 1000 mg via TOPICAL

## 2018-04-16 MED ORDER — SODIUM CHLORIDE 0.9% FLUSH: 3.0000 mL | INTRAVENOUS | Status: DC | PRN

## 2018-04-16 MED ORDER — ATENOLOL 25 MG PO TABS
100.0000 mg | ORAL_TABLET | Freq: Every day | ORAL | Status: DC
  Administered 2018-04-16: 100 mg via ORAL
  Filled 2018-04-16: qty 4

## 2018-04-16 MED ORDER — SODIUM CHLORIDE 0.9 % IV SOLN
INTRAVENOUS | Status: DC | PRN
  Administered 2018-04-16: 12:00:00 via INTRAVENOUS

## 2018-04-16 MED ORDER — CEFAZOLIN SODIUM-DEXTROSE 2-4 GM/100ML-% IV SOLN
2.0000 g | Freq: Three times a day (TID) | INTRAVENOUS | Status: AC
  Administered 2018-04-16 – 2018-04-17 (×2): 2 g via INTRAVENOUS
  Filled 2018-04-16 (×2): qty 100

## 2018-04-16 SURGICAL SUPPLY — 75 items
BAG DECANTER FOR FLEXI CONT (MISCELLANEOUS) ×3 IMPLANT
BENZOIN TINCTURE PRP APPL 2/3 (GAUZE/BANDAGES/DRESSINGS) ×3 IMPLANT
BLADE CLIPPER SURG (BLADE) IMPLANT
BUR MATCHSTICK NEURO 3.0 LAGG (BURR) ×3 IMPLANT
BUR PRECISION FLUTE 6.0 (BURR) ×3 IMPLANT
CANISTER SUCT 3000ML PPV (MISCELLANEOUS) ×3 IMPLANT
CAP REVERE LOCKING (Cap) ×18 IMPLANT
CARTRIDGE OIL MAESTRO DRILL (MISCELLANEOUS) ×1 IMPLANT
CLOSURE WOUND 1/2 X4 (GAUZE/BANDAGES/DRESSINGS) ×1
CONT SPEC 4OZ CLIKSEAL STRL BL (MISCELLANEOUS) ×3 IMPLANT
COVER BACK TABLE 60X90IN (DRAPES) ×3 IMPLANT
DECANTER SPIKE VIAL GLASS SM (MISCELLANEOUS) IMPLANT
DERMABOND ADVANCED (GAUZE/BANDAGES/DRESSINGS) ×2
DERMABOND ADVANCED .7 DNX12 (GAUZE/BANDAGES/DRESSINGS) ×1 IMPLANT
DIFFUSER DRILL AIR PNEUMATIC (MISCELLANEOUS) ×3 IMPLANT
DRAPE C-ARM 42X72 X-RAY (DRAPES) ×6 IMPLANT
DRAPE HALF SHEET 40X57 (DRAPES) ×3 IMPLANT
DRAPE LAPAROTOMY 100X72X124 (DRAPES) ×3 IMPLANT
DRAPE SURG 17X23 STRL (DRAPES) ×12 IMPLANT
DRSG OPSITE POSTOP 4X6 (GAUZE/BANDAGES/DRESSINGS) ×3 IMPLANT
ELECT BLADE 4.0 EZ CLEAN MEGAD (MISCELLANEOUS) ×3
ELECT REM PT RETURN 9FT ADLT (ELECTROSURGICAL) ×3
ELECTRODE BLDE 4.0 EZ CLN MEGD (MISCELLANEOUS) ×1 IMPLANT
ELECTRODE REM PT RTRN 9FT ADLT (ELECTROSURGICAL) ×1 IMPLANT
GAUZE SPONGE 4X4 12PLY STRL (GAUZE/BANDAGES/DRESSINGS) ×3 IMPLANT
GAUZE SPONGE 4X4 16PLY XRAY LF (GAUZE/BANDAGES/DRESSINGS) ×3 IMPLANT
GLOVE BIO SURGEON STRL SZ7 (GLOVE) ×9 IMPLANT
GLOVE BIO SURGEON STRL SZ8 (GLOVE) ×6 IMPLANT
GLOVE BIO SURGEON STRL SZ8.5 (GLOVE) ×6 IMPLANT
GLOVE BIOGEL PI IND STRL 6.5 (GLOVE) ×2 IMPLANT
GLOVE BIOGEL PI IND STRL 7.5 (GLOVE) ×1 IMPLANT
GLOVE BIOGEL PI IND STRL 8 (GLOVE) ×1 IMPLANT
GLOVE BIOGEL PI INDICATOR 6.5 (GLOVE) ×4
GLOVE BIOGEL PI INDICATOR 7.5 (GLOVE) ×2
GLOVE BIOGEL PI INDICATOR 8 (GLOVE) ×2
GLOVE ECLIPSE 7.5 STRL STRAW (GLOVE) ×3 IMPLANT
GLOVE EXAM NITRILE LRG STRL (GLOVE) IMPLANT
GLOVE EXAM NITRILE XL STR (GLOVE) IMPLANT
GLOVE EXAM NITRILE XS STR PU (GLOVE) IMPLANT
GLOVE SURG SS PI 6.5 STRL IVOR (GLOVE) ×9 IMPLANT
GOWN STRL REUS W/ TWL LRG LVL3 (GOWN DISPOSABLE) ×3 IMPLANT
GOWN STRL REUS W/ TWL XL LVL3 (GOWN DISPOSABLE) ×3 IMPLANT
GOWN STRL REUS W/TWL 2XL LVL3 (GOWN DISPOSABLE) IMPLANT
GOWN STRL REUS W/TWL LRG LVL3 (GOWN DISPOSABLE) ×6
GOWN STRL REUS W/TWL XL LVL3 (GOWN DISPOSABLE) ×6
HEMOSTAT POWDER KIT SURGIFOAM (HEMOSTASIS) ×6 IMPLANT
KIT BASIN OR (CUSTOM PROCEDURE TRAY) ×3 IMPLANT
KIT TURNOVER KIT B (KITS) ×3 IMPLANT
MILL MEDIUM DISP (BLADE) IMPLANT
NEEDLE HYPO 21X1.5 SAFETY (NEEDLE) ×3 IMPLANT
NEEDLE HYPO 22GX1.5 SAFETY (NEEDLE) ×3 IMPLANT
NS IRRIG 1000ML POUR BTL (IV SOLUTION) ×3 IMPLANT
OIL CARTRIDGE MAESTRO DRILL (MISCELLANEOUS) ×3
PACK LAMINECTOMY NEURO (CUSTOM PROCEDURE TRAY) ×3 IMPLANT
PAD ARMBOARD 7.5X6 YLW CONV (MISCELLANEOUS) ×18 IMPLANT
PATTIES SURGICAL .5 X1 (DISPOSABLE) IMPLANT
PATTIES SURGICAL 1X1 (DISPOSABLE) ×6 IMPLANT
ROD REVERE 7.5MM (Rod) ×6 IMPLANT
SCREW 7.5X45MM (Screw) ×15 IMPLANT
SCREW REVERE 6.35 7.5X40 (Screw) ×3 IMPLANT
SPACER ALTERA 10X31-15 (Spacer) ×3 IMPLANT
SPACER RISE 8X22 7-13MM (Spacer) ×6 IMPLANT
SPONGE LAP 4X18 RFD (DISPOSABLE) IMPLANT
SPONGE NEURO XRAY DETECT 1X3 (DISPOSABLE) IMPLANT
SPONGE SURGIFOAM ABS GEL 100 (HEMOSTASIS) ×3 IMPLANT
STRIP BIOACTIVE 20CC 25X100X8 (Miscellaneous) ×3 IMPLANT
STRIP CLOSURE SKIN 1/2X4 (GAUZE/BANDAGES/DRESSINGS) ×2 IMPLANT
SUT VIC AB 1 CT1 18XBRD ANBCTR (SUTURE) ×2 IMPLANT
SUT VIC AB 1 CT1 8-18 (SUTURE) ×4
SUT VIC AB 2-0 CP2 18 (SUTURE) ×6 IMPLANT
SYR 20CC LL (SYRINGE) ×3 IMPLANT
TOWEL GREEN STERILE (TOWEL DISPOSABLE) ×3 IMPLANT
TOWEL GREEN STERILE FF (TOWEL DISPOSABLE) ×3 IMPLANT
TRAY FOLEY MTR SLVR 16FR STAT (SET/KITS/TRAYS/PACK) ×3 IMPLANT
WATER STERILE IRR 1000ML POUR (IV SOLUTION) ×3 IMPLANT

## 2018-04-16 NOTE — Anesthesia Procedure Notes (Addendum)
Procedure Name: Intubation Date/Time: 04/16/2018 7:45 AM Performed by: Montez Hageman, MD Pre-anesthesia Checklist: Patient identified, Emergency Drugs available, Suction available, Patient being monitored and Timeout performed Patient Re-evaluated:Patient Re-evaluated prior to induction Oxygen Delivery Method: Circle system utilized Preoxygenation: Pre-oxygenation with 100% oxygen Induction Type: IV induction Ventilation: Mask ventilation without difficulty Laryngoscope Size: Mac, 4 and Glidescope Grade View: Grade III Tube type: Oral Tube size: 7.5 mm Number of attempts: 3 Airway Equipment and Method: Stylet and Bougie stylet Placement Confirmation: ETT inserted through vocal cords under direct vision,  positive ETCO2 and breath sounds checked- equal and bilateral Secured at: 22 cm Tube secured with: Tape Dental Injury: Teeth and Oropharynx as per pre-operative assessment  Difficulty Due To: Difficulty was anticipated, Difficult Airway- due to anterior larynx, Difficult Airway- due to limited oral opening, Difficult Airway- due to dentition and Difficult Airway- due to large tongue Future Recommendations: Recommend- induction with short-acting agent, and alternative techniques readily available Comments: DL x 1 with MAC 3 blade with poor view.  DL x 1 with MAC 4 blade on glidescope, anterior and deep larynx noted.  Could not pass ETT despite good view of cords.  Recruitment breaths given, O2 sats returned to 98%.   DL x 1 by Dr Marcell Barlow with glidescope, passed ETT over bougie stylet.  + ETCO2, + BBSE.  Suctioned ETT, some blood noted.  Decadron administered.  Henderson Cloud, CRNA

## 2018-04-16 NOTE — Anesthesia Preprocedure Evaluation (Addendum)
Anesthesia Evaluation  Patient identified by MRN, date of birth, ID band Patient awake    Reviewed: Allergy & Precautions, NPO status , Patient's Chart, lab work & pertinent test results  Airway Mallampati: II  TM Distance: >3 FB Neck ROM: Full    Dental no notable dental hx. (+) Dental Advisory Given,    Pulmonary neg pulmonary ROS,    Pulmonary exam normal breath sounds clear to auscultation       Cardiovascular hypertension, Pt. on medications Normal cardiovascular exam Rhythm:Regular Rate:Normal  Takotsubo   Neuro/Psych Seizures -,  Anxiety Depression Bipolar Disorder    GI/Hepatic negative GI ROS, Neg liver ROS,   Endo/Other  Morbid obesity  Renal/GU negative Renal ROS  negative genitourinary   Musculoskeletal negative musculoskeletal ROS (+)   Abdominal   Peds negative pediatric ROS (+)  Hematology negative hematology ROS (+)   Anesthesia Other Findings   Reproductive/Obstetrics negative OB ROS                          Anesthesia Physical Anesthesia Plan  ASA: III  Anesthesia Plan: General   Post-op Pain Management:    Induction: Intravenous  PONV Risk Score and Plan: 3 and Ondansetron, Dexamethasone, Midazolam and Treatment may vary due to age or medical condition  Airway Management Planned: Oral ETT  Additional Equipment:   Intra-op Plan:   Post-operative Plan: Extubation in OR  Informed Consent: I have reviewed the patients History and Physical, chart, labs and discussed the procedure including the risks, benefits and alternatives for the proposed anesthesia with the patient or authorized representative who has indicated his/her understanding and acceptance.   Dental advisory given  Plan Discussed with: CRNA  Anesthesia Plan Comments:        Anesthesia Quick Evaluation

## 2018-04-16 NOTE — Progress Notes (Signed)
Subjective: The patient is alert and pleasant.  Her back is appropriately sore.  She looks well.  Objective: Vital signs in last 24 hours: Temp:  [97.9 F (36.6 C)-98.6 F (37 C)] 97.9 F (36.6 C) (06/26 1242) Pulse Rate:  [70-80] 80 (06/26 1315) Resp:  [12-20] 15 (06/26 1315) BP: (124-151)/(79-86) 138/86 (06/26 1315) SpO2:  [93 %-100 %] 93 % (06/26 1315) Weight:  [118.4 kg (261 lb 1.6 oz)] 118.4 kg (261 lb 1.6 oz) (06/26 0555) Estimated body mass index is 43.45 kg/m as calculated from the following:   Height as of this encounter: 5\' 5"  (1.651 m).   Weight as of this encounter: 118.4 kg (261 lb 1.6 oz).   Intake/Output from previous day: No intake/output data recorded. Intake/Output this shift: Total I/O In: 2200 [I.V.:2100; Blood:100] Out: 660 [Urine:260; Blood:400]  Physical exam the patient is alert and pleasant.  She is moving her lower extremities well.  Lab Results: No results for input(s): WBC, HGB, HCT, PLT in the last 72 hours. BMET No results for input(s): NA, K, CL, CO2, GLUCOSE, BUN, CREATININE, CALCIUM in the last 72 hours.  Studies/Results: Dg Lumbar Spine 2-3 Views  Result Date: 04/16/2018 CLINICAL DATA:  Status post surgical posterior fusion of L4-5 and L5-S1. EXAM: LUMBAR SPINE - 2-3 VIEW; DG C-ARM 61-120 MIN FLUOROSCOPY TIME:  44 seconds. COMPARISON:  CT scan of February 14, 2018. FINDINGS: Two intraoperative fluoroscopic images of the lumbar spine demonstrates bilateral intrapedicular screw placement of L4, L5 and S1 as well as interbody fusion of these disc spaces. IMPRESSION: Surgical posterior fusion of L4-5 and L5-S1. Electronically Signed   By: Lupita Raider, M.D.   On: 04/16/2018 13:10   Dg Lumbar Spine 1 View  Result Date: 04/16/2018 CLINICAL DATA:  L4-5 PLIF EXAM: LUMBAR SPINE - 1 VIEW COMPARISON:  02/14/2018 FINDINGS: Surgical retractors are noted posterior to the L4 vertebral body. Surgical instrument is noted at the level of the L4-5 interspace.  Radiopaque sponge material is noted as well. Numbering nomenclature is similar to that utilized on the prior exam. IMPRESSION: Intraoperative localization at L4-5 Electronically Signed   By: Alcide Clever M.D.   On: 04/16/2018 11:05   Dg C-arm 1-60 Min  Result Date: 04/16/2018 CLINICAL DATA:  Status post surgical posterior fusion of L4-5 and L5-S1. EXAM: LUMBAR SPINE - 2-3 VIEW; DG C-ARM 61-120 MIN FLUOROSCOPY TIME:  44 seconds. COMPARISON:  CT scan of February 14, 2018. FINDINGS: Two intraoperative fluoroscopic images of the lumbar spine demonstrates bilateral intrapedicular screw placement of L4, L5 and S1 as well as interbody fusion of these disc spaces. IMPRESSION: Surgical posterior fusion of L4-5 and L5-S1. Electronically Signed   By: Lupita Raider, M.D.   On: 04/16/2018 13:10   Dg C-arm 1-60 Min  Result Date: 04/16/2018 CLINICAL DATA:  Status post surgical posterior fusion of L4-5 and L5-S1. EXAM: LUMBAR SPINE - 2-3 VIEW; DG C-ARM 61-120 MIN FLUOROSCOPY TIME:  44 seconds. COMPARISON:  CT scan of February 14, 2018. FINDINGS: Two intraoperative fluoroscopic images of the lumbar spine demonstrates bilateral intrapedicular screw placement of L4, L5 and S1 as well as interbody fusion of these disc spaces. IMPRESSION: Surgical posterior fusion of L4-5 and L5-S1. Electronically Signed   By: Lupita Raider, M.D.   On: 04/16/2018 13:10    Assessment/Plan: The patient is doing well.  I spoke with her friend.  LOS: 0 days     Cristi Loron 04/16/2018, 1:26 PM

## 2018-04-16 NOTE — H&P (Signed)
Subjective: The patient is a 52 year old white female who has complained of back, buttock and leg pain consistent with neurogenic claudication.  She has failed medical management.  She was worked up with a lumbar myelo CT which demonstrated an L4-5 spondylolisthesis with spondylosis at L5-S1 and spinal stenosis.  I discussed the various treatment options.  She has decided to proceed with surgery.  Past Medical History:  Diagnosis Date  . Anxiety   . Arthritis   . Bipolar disorder (HCC)   . Bulging lumbar disc   . CHF (congestive heart failure) (HCC)    2011  . Chronic back pain   . Degenerative disc disease   . Depression   . Dyspnea   . Headache   . History of kidney stones   . Hypertension   . Hypertensive encephalopathy   . Nonischemic cardiomyopathy (HCC)    stress inducted (Takotsubo type) 12/2009 in the setting of unintentional drug OD with acute respiratory and renal failure; No CAD by 12/2009 cath; EF recovered (> 55% '15, '17)  . Osteoarthritis of knees, bilateral   . Pneumonia   . Respiratory failure [facility])    March 2011  . Seizures (HCC)     Past Surgical History:  Procedure Laterality Date  . ABDOMINAL HYSTERECTOMY    . APPENDECTOMY    . CHOLECYSTECTOMY    . JOINT REPLACEMENT    . TONSILLECTOMY    . TOTAL KNEE ARTHROPLASTY Right 05/13/2017   Procedure: RIGHT TOTAL KNEE ARTHROPLASTY;  Surgeon: Dannielle Huh, MD;  Location: [facility] OR;  Service: Orthopedics;  Laterality: Right;  . TOTAL KNEE ARTHROPLASTY Left 08/19/2017   Procedure: TOTAL KNEE ARTHROPLASTY;  Surgeon: Dannielle Huh, MD;  Location: [facility] OR;  Service: Orthopedics;  Laterality: Left;    No Known Allergies  Social History   Tobacco Use  . Smoking status: Never Smoker  . Smokeless tobacco: Never Used  Substance Use Topics  . Alcohol use: No    Family History  Problem Relation Age of Onset  . Emphysema Mother   . Hypertension Mother   . Liver disease Father    Prior to Admission medications   Medication  Sig Start Date End Date Taking? Authorizing Provider  acetaminophen (TYLENOL) 500 MG tablet Take 1,000 mg by mouth every 6 (six) hours as needed for moderate pain or headache.   Yes [provider]  amLODipine (NORVASC) 2.5 MG tablet Take 2.5 mg by mouth daily.   Yes [provider]  ARIPiprazole (ABILIFY) 15 MG tablet Take 15 mg by mouth daily.    Yes [provider]  atenolol-chlorthalidone (TENORETIC) 100-25 MG tablet Take 1 tablet by mouth daily.  11/01/15  Yes [provider]  DULoxetine (CYMBALTA) 60 MG capsule Take 120 mg by mouth daily.    Yes [provider]  oxcarbazepine (TRILEPTAL) 600 MG tablet Take 1,200 mg by mouth daily.    Yes [provider]  traZODone (DESYREL) 100 MG tablet Take 200 mg by mouth at bedtime.    Yes [provider]  aspirin EC 325 MG EC tablet Take 1 tablet (325 mg total) by mouth 2 (two) times daily. Patient not taking: Reported on 12/16/2017 08/20/17   Guy Sandifer, PA  HYDROcodone-acetaminophen [facility]) 10-325 MG tablet Take 1 tablet by mouth every 4 (four) hours as needed for moderate pain ((score 4 to 6)). Patient not taking: Reported on 04/02/2018 08/20/17   Guy Sandifer, PA  methocarbamol (ROBAXIN) 500 MG tablet Take 1-2 tablets (500-1,000 mg  total) by mouth every 6 (six) hours as needed for muscle spasms. Patient not taking: Reported on 12/16/2017 08/20/17   Guy Sandifer, PA  traMADol (ULTRAM) 50 MG tablet Take 1-2 tablets (50-100 mg total) by mouth every 6 (six) hours. Patient not taking: Reported on 12/16/2017 08/20/17   Guy Sandifer, PA     Review of Systems  Positive ROS: As above  All other systems have been reviewed and were otherwise negative with the exception of those mentioned in the HPI and as above.  Objective: Vital signs in last 24 hours: Temp:  [98.6 F (37 C)] 98.6 F (37 C) (06/26 0555) Pulse Rate:  [70] 70 (06/26 0555) Resp:  [20] 20 (06/26  0555) BP: (151)/(80) 151/80 (06/26 0555) SpO2:  [97 %] 97 % (06/26 0555) Weight:  [118.4 kg (261 lb 1.6 oz)] 118.4 kg (261 lb 1.6 oz) (06/26 0555) Estimated body mass index is 43.45 kg/m as calculated from the following:   Height as of this encounter: 5\' 5"  (1.651 m).   Weight as of this encounter: 118.4 kg (261 lb 1.6 oz).   General Appearance: Alert Head: Normocephalic, without obvious abnormality, atraumatic Eyes: PERRL, conjunctiva/corneas clear, EOM's intact,    Ears: Normal  Throat: Normal  Neck: Supple, Back: unremarkable Lungs: Clear to auscultation bilaterally, respirations unlabored Heart: Regular rate and rhythm, no murmur, rub or gallop Abdomen: Soft, non-tender Extremities: Extremities normal, atraumatic, no cyanosis or edema Skin: unremarkable  NEUROLOGIC:   Mental status: alert and oriented,Motor Exam - grossly normal Sensory Exam - grossly normal Reflexes:  Coordination - grossly normal Gait - grossly normal Balance - grossly normal Cranial Nerves: I: smell Not tested  II: visual acuity  OS: Normal  OD: Normal   II: visual fields Full to confrontation  II: pupils Equal, round, reactive to light  III,VII: ptosis None  III,IV,VI: extraocular muscles  Full ROM  V: mastication Normal  V: facial light touch sensation  Normal  V,VII: corneal reflex  Present  VII: facial muscle function - upper  Normal  VII: facial muscle function - lower Normal  VIII: hearing Not tested  IX: soft palate elevation  Normal  IX,X: gag reflex Present  XI: trapezius strength  5/5  XI: sternocleidomastoid strength 5/5  XI: neck flexion strength  5/5  XII: tongue strength  Normal    Data Review Lab Results  Component Value Date   WBC 9.6 04/08/2018   HGB 13.4 04/08/2018   HCT 40.8 04/08/2018   MCV 89.7 04/08/2018   PLT 304 04/08/2018   Lab Results  Component Value Date   NA 137 04/08/2018   K 4.0 04/08/2018   CL 104 04/08/2018   CO2 26 04/08/2018   BUN 8  04/08/2018   CREATININE 0.79 04/08/2018   GLUCOSE 100 (H) 04/08/2018   Lab Results  Component Value Date   INR 1.08 12/30/2009    Assessment/Plan: L4-5 spondylolisthesis, L5-S1 spondylosis, lumbar spinal stenosis, lumbago, lumbar radiculopathy, neurogenic claudication: I have discussed the situation with the patient.  I have reviewed her imaging studies with her and pointed out the abnormalities.  We have discussed the various treatment options including surgery.  I have described the surgical treatment option of an L4-5 and L5-S1 decompression, instrumentation, and fusion.  I have shown her surgical models.  I have given her surgical pamphlet.  We have discussed the risks, benefits, alternatives, expected postoperative course, and likelihood of achieving our goals with surgery.  I have answered all the patient's  questions.  She has decided to proceed with surgery.   Cristi Loron 04/16/2018 7:25 AM

## 2018-04-16 NOTE — Op Note (Signed)
Brief history: The patient is a 52 year old white female who has complained of back and leg pain.  She has failed medical management.  She was worked up with a lumbar MRI and a lumbar myelo CT which demonstrated the patient had an L4-5 spondylolisthesis with severe arthropathy, and spinal stenosis.  She had disc degeneration and neuroforaminal stenosis at L5-S1.  I discussed the various treatment options with the patient.  She has decided to proceed with an L4-5 and L5-S1 decompression, instrumentation, and fusion after weighing the risks, benefits, and alternatives to surgery.  Preoperative diagnosis: L4-5 spondylolisthesis, facet arthropathy, L4-5 and L5-S1 degenerative disc disease, spinal stenosis compressing both the L5 and the S1 nerve roots; lumbago; lumbar radiculopathy  Postoperative diagnosis: The same  Procedure: Bilateral L4-5 and L5-S1 laminotomy/foraminotomies to decompress the bilateral L5 and S1 nerve roots(the work required to do this was in addition to the work required to do the posterior lumbar interbody fusion because of the patient's spinal stenosis, facet arthropathy. Etc. requiring a wide decompression of the nerve roots.);  L4-5  transforaminal lumbar interbody fusion and L5-S1 posterior lumbar interbody fusion with local morselized autograft bone and Kinnex graft extender; insertion of interbody prosthesis at L4-5 and L5-S1 (globus peek expandable interbody prosthesis); posterior segmental instrumentation from L4 to S1 with globus titanium pedicle screws and rods; posterior lateral arthrodesis at L4-5 and L5-S1 bilaterally with local morselized autograft bone and Kinnex bone graft extender.  Surgeon: Dr. Delma Officer  Asst.: Dr. Shirlean Kelly  Anesthesia: Gen. endotracheal  Estimated blood loss: 500 cc  Drains: None  Complications: None  Description of procedure: The patient was brought to the operating room by the anesthesia team. General endotracheal anesthesia was  induced. The patient was turned to the prone position on the Wilson frame. The patient's lumbosacral region was then prepared with Betadine scrub and Betadine solution. Sterile drapes were applied.  I then injected the area to be incised with Marcaine with epinephrine solution. I then used the scalpel to make a linear midline incision over the L4-5 and L5-S1 interspace. I then used electrocautery to perform a bilateral subperiosteal dissection exposing the spinous process and lamina of L4, L5 and S1. We then obtained intraoperative radiograph to confirm our location. We then inserted the Verstrac retractor to provide exposure.  I began the decompression by using the high speed drill to perform laminotomies at L4-5 and L5-S1 bilaterally. We then used the Kerrison punches to widen the laminotomy and removed the ligamentum flavum at L4-5 and L5-S1 bilaterally. We used the Kerrison punches to remove the medial facets at L4-5 and L5-S1 bilaterally. We performed wide foraminotomies about the bilateral L5 and S1 nerve roots completing the decompression.  We now turned our attention to the posterior lumbar interbody fusion. I used a scalpel to incise the intervertebral disc at L4-5 and L5-S1 bilaterally. I then performed a partial intervertebral discectomy at L4-5 and L5-S1 bilaterally using the pituitary forceps. We prepared the vertebral endplates at L4-5 and L5-S1 bilaterally for the fusion by removing the soft tissues with the curettes. We then used the trial spacers to pick the appropriate sized interbody prosthesis. We prefilled his prosthesis with a combination of local morselized autograft bone that we obtained during the decompression as well as Kinnex bone graft extender. We inserted the prefilled prosthesis into the interspace at L4-5 from the right, we then expanded the prosthesis.  At L5-S1 we put bilateral interbody prosthesis.  There was a good snug fit of the prosthesis in  the interspace. We then  filled and the remainder of the intervertebral disc space with local morselized autograft bone and Kinnex. This completed the posterior lumbar interbody arthrodesis.  We now turned attention to the instrumentation. Under fluoroscopic guidance we cannulated the bilateral L4, L5 and S1 pedicles with the bone probe. We then removed the bone probe. We then tapped the pedicle with a 6.5 millimeter tap. We then removed the tap. We probed inside the tapped pedicle with a ball probe to rule out cortical breaches. We then inserted a 7.5 x 45 millimeter pedicle screw into the L4, L5 and S1 pedicles bilaterally under fluoroscopic guidance. We then palpated along the medial aspect of the pedicles to rule out cortical breaches. There were none. The nerve roots were not injured. We then connected the unilateral pedicle screws with a lordotic rod. We compressed the construct and secured the rod in place with the caps. We then tightened the caps appropriately. This completed the instrumentation from L4-S1 bilaterally.  We now turned our attention to the posterior lateral arthrodesis at L4-5 and L5-S1 bilaterally. We used the high-speed drill to decorticate the remainder of the facets, pars, transverse process at L4-5 and L5-S1 bilaterally. We then applied a combination of local morselized autograft bone and Kinnex bone graft extender over these decorticated posterior lateral structures. This completed the posterior lateral arthrodesis.  We then obtained hemostasis using bipolar electrocautery. We irrigated the wound out with bacitracin solution. We inspected the thecal sac and nerve roots and noted they were well decompressed. We then removed the retractor. We placed vancomycin powder in the wound. We reapproximated patient's thoracolumbar fascia with interrupted #1 Vicryl suture. We reapproximated patient's subcutaneous tissue with interrupted 2-0 Vicryl suture. The reapproximated patient's skin with Steri-Strips and  benzoin. The wound was then coated with bacitracin ointment. A sterile dressing was applied. The drapes were removed. The patient was subsequently returned to the supine position where they were extubated by the anesthesia team. He was then transported to the post anesthesia care unit in stable condition. All sponge instrument and needle counts were reportedly correct at the end of this case.

## 2018-04-16 NOTE — Progress Notes (Signed)
Report given to CJ RN as caregiver 

## 2018-04-16 NOTE — Transfer of Care (Signed)
Immediate Anesthesia Transfer of Care Note  Patient: Cynthia Richardson  Procedure(s) Performed: POSTERIOR LUMBAR INTERBODY FUSION, INTERBODY PROSTHESIS, POSTERIOR INSTRUMENTATION AND FUSION LUMBAR FOUR- LUMBAR FIVE, LUMBAR FIVE- SACRAL ONE (N/A Spine Lumbar)  Patient Location: PACU  Anesthesia Type:General  Level of Consciousness: awake, alert  and oriented  Airway & Oxygen Therapy: Patient connected to face mask oxygen  Post-op Assessment: Post -op Vital signs reviewed and stable  Post vital signs: stable  Last Vitals:  Vitals Value Taken Time  BP 138/79 04/16/2018 12:44 PM  Temp    Pulse 82 04/16/2018 12:45 PM  Resp 15 04/16/2018 12:45 PM  SpO2 99 % 04/16/2018 12:45 PM  Vitals shown include unvalidated device data.  Last Pain:  Vitals:   04/16/18 0555  TempSrc: Oral  PainSc: 9          Complications: No apparent anesthesia complications

## 2018-04-17 LAB — BASIC METABOLIC PANEL
ANION GAP: 8 (ref 5–15)
BUN: 10 mg/dL (ref 6–20)
CHLORIDE: 103 mmol/L (ref 98–111)
CO2: 27 mmol/L (ref 22–32)
Calcium: 8.3 mg/dL — ABNORMAL LOW (ref 8.9–10.3)
Creatinine, Ser: 0.77 mg/dL (ref 0.44–1.00)
GFR calc non Af Amer: >60 mL/min (ref >60)
Glucose, Bld: 129 mg/dL — ABNORMAL HIGH (ref 70–99)
POTASSIUM: 3.9 mmol/L (ref 3.5–5.1)
SODIUM: 138 mmol/L (ref 135–145)

## 2018-04-17 LAB — CBC
HCT: 32.5 % — ABNORMAL LOW (ref 36.0–46.0)
HEMOGLOBIN: 10.5 g/dL — AB (ref 12.0–15.0)
MCH: 29.4 pg (ref 26.0–34.0)
MCHC: 32.3 g/dL (ref 30.0–36.0)
MCV: 91 fL (ref 78.0–100.0)
PLATELETS: 245 10*3/uL (ref 150–400)
RBC: 3.57 MIL/uL — AB (ref 3.87–5.11)
RDW: 12.5 % (ref 11.5–15.5)
WBC: 15.1 10*3/uL — AB (ref 4.0–10.5)

## 2018-04-17 MED FILL — Sodium Chloride IV Soln 0.9%: INTRAVENOUS | Qty: 2000 | Status: AC

## 2018-04-17 MED FILL — Heparin Sodium (Porcine) Inj 1000 Unit/ML: INTRAMUSCULAR | Qty: 30 | Status: AC

## 2018-04-17 NOTE — Anesthesia Postprocedure Evaluation (Signed)
Anesthesia Post Note  Patient: Cynthia Richardson  Procedure(s) Performed: POSTERIOR LUMBAR INTERBODY FUSION, INTERBODY PROSTHESIS, POSTERIOR INSTRUMENTATION AND FUSION LUMBAR FOUR- LUMBAR FIVE, LUMBAR FIVE- SACRAL ONE (N/A Spine Lumbar)     Patient location during evaluation: PACU Anesthesia Type: General Level of consciousness: awake and alert Pain management: pain level controlled Vital Signs Assessment: post-procedure vital signs reviewed and stable Respiratory status: spontaneous breathing, nonlabored ventilation, respiratory function stable and patient connected to nasal cannula oxygen Cardiovascular status: blood pressure returned to baseline and stable Postop Assessment: no apparent nausea or vomiting Anesthetic complications: no    Last Vitals:  Vitals:   04/17/18 1223 04/17/18 1706  BP: (!) 124/53 (!) 102/56  Pulse: 90 84  Resp: 18 18  Temp: 36.8 C 36.8 C  SpO2: 96% 95%    Last Pain:  Vitals:   04/17/18 1752  TempSrc:   PainSc: 4    Pain Goal: Patients Stated Pain Goal: 4 (04/17/18 0759)               [name]

## 2018-04-17 NOTE — Care Management Note (Signed)
Case Management Note  Patient Details  Name: Cynthia Richardson MRN: [id number] Date of Birth: 1966/01/01  Subjective/Objective:  NCM spoke with patient she chose [facility] for HHPT/HHOT, referral given to [facility] with [facility] . Soc will begin 24-48 hrs post dc.  She will also need 3 n 1.                  Action/Plan: DC home when ready.  Expected Discharge Date:                  Expected Discharge Plan:  Home w Home Health Services  In-House Referral:     Discharge planning Services  CM Consult  Post Acute Care Choice:  Home Health Choice offered to:  Patient  DME Arranged:  3-N-1 DME Agency:  [facility]  HH Arranged:  PT, OT [facility] Agency:  [facility]  Status of Service:  Completed, signed off  If discussed at Long Length of Stay Meetings, dates discussed:    Additional Comments:  Leone Haven, RN 04/17/2018, 12:24 PM

## 2018-04-17 NOTE — Evaluation (Addendum)
Occupational Therapy Evaluation Patient Details Name: Cynthia Richardson MRN: [id number] DOB: 1966/04/27 Today's Date: 04/17/2018    History of Present Illness PT is a 52 y/o female s/p bilateral L4-5 and L5-S1 laminotomy/foraminotomies with decompression, L4-5  transforaminal lumbar interbody fusion and L5-S1 posterior lumbar interbody fusion; PMhx includes prev bil TKAs, anxiety, bipolar disorder, CHF, HTN, seizures   Clinical Impression   This 52 y/o female presents with the above. Pt reports she lives with a roommate, at baseline reports she was ambulating without AD, receiving assist from roommate for LB dressing. Pt significantly limited by pain this session. Completing bed mobility and sit<>stand at RW. Pt requiring minA for sit<>Stand at [facility] and for taking small side steps along EOB. Not able to progress mobility this session due to pain and dizziness with standing. Currently requires MaxA for LB ADLs, minA for UB ADLs. Pt reports roommate will be available to assist after return home with ADLs PRN. Feel pt will progress well once pain decreases. Pt will benefit from continued acute OT services and recommend follow up [facility] services after discharge to maximize her safety and independence with ADLs and mobility.     Follow Up Recommendations  Home health OT;Supervision/Assistance - 24 hour    Equipment Recommendations  3 in 1 bedside commode           Precautions / Restrictions Precautions Precautions: Back Precaution Booklet Issued: Yes (comment) Precaution Comments: issued and reviewed back precautions Required Braces or Orthoses: Spinal Brace Spinal Brace: Applied in sitting position;Lumbar corset Restrictions Weight Bearing Restrictions: No      Mobility Bed Mobility Overal bed mobility: Needs Assistance Bed Mobility: Rolling;Sidelying to Sit;Sit to Sidelying Rolling: Min guard Sidelying to sit: Min guard     Sit to sidelying: Mod assist General bed mobility comments:  verbal cues for proper log roll technique; pt able to bring LEs over EOB and elevate trunk into sitting without physical assist and minguard for safety; requires assist for LEs onto EOB when returning to sidelying   Transfers Overall transfer level: Needs assistance Equipment used: Rolling walker (2 wheeled) Transfers: Sit to/from Stand Sit to Stand: Min assist         General transfer comment: completed x2 from EOB, minA to rise and steady at RW with verbal cues for safe hand placement. Pt with dizziness upon initial standing requiring return to sitting EOB     Balance Overall balance assessment: Needs assistance Sitting-balance support: Feet supported Sitting balance-Leahy Scale: Fair     Standing balance support: Bilateral upper extremity supported Standing balance-Leahy Scale: Poor Standing balance comment: reliant on UE support                            ADL either performed or assessed with clinical judgement   ADL Overall ADL's : Needs assistance/impaired Eating/Feeding: Sitting;Modified independent   Grooming: Set up;Sitting   Upper Body Bathing: Min guard;Sitting   Lower Body Bathing: Moderate assistance;Sit to/from stand   Upper Body Dressing : Minimal assistance;Sitting   Lower Body Dressing: Maximal assistance;Sit to/from stand Lower Body Dressing Details (indicate cue type and reason): minguard for sit<>stand and standing balance using UE support Toilet Transfer: Minimal assistance;Stand-pivot;BSC;RW Toilet Transfer Details (indicate cue type and reason): simulated in transfer to/from EOB, pt limited by pain and unable to move further than few steps along EOB Toileting- Clothing Manipulation and Hygiene: Maximal assistance;Sit to/from stand       Functional mobility during  ADLs: Minimal assistance;Rolling walker General ADL Comments: pt significantly limited by pain (premedicated prior to session), initially with dizziness upon sit<>stand with  VSS, completed x2 sit<>stands from EOB but not able to tolerate further mobility, took side steps along EOB using RW with minguard assist prior to return to sitting      Vision         Perception     Praxis      Pertinent Vitals/Pain Pain Assessment: Faces Faces Pain Scale: Hurts whole lot Pain Location: back  Pain Descriptors / Indicators: Grimacing;Guarding;Sore;Moaning Pain Intervention(s): Monitored during session;Premedicated before session;Repositioned     Hand Dominance Right   Extremity/Trunk Assessment Upper Extremity Assessment Upper Extremity Assessment: Overall WFL for tasks assessed   Lower Extremity Assessment Lower Extremity Assessment: Defer to PT evaluation   Cervical / Trunk Assessment Cervical / Trunk Assessment: Other exceptions Cervical / Trunk Exceptions: s/p lumbar surgery    Communication Communication Communication: No difficulties   Cognition Arousal/Alertness: Awake/alert Behavior During Therapy: WFL for tasks assessed/performed Overall Cognitive Status: Within Functional Limits for tasks assessed                                     General Comments       Exercises     Shoulder Instructions      Home Living Family/patient expects to be discharged to:: Private residence Living Arrangements: Non-relatives/Friends("roommate") Available Help at Discharge: Friend(s)(almost 24hr) Type of Home: Mobile home Home Access: Stairs to enter Secretary/administrator of Steps: 4 Entrance Stairs-Rails: Right;Left;Can reach both Home Layout: One level     Bathroom Shower/Tub: Chief Strategy Officer: Standard     Home Equipment: Cane - single point;Walker - 2 wheels;Shower seat          Prior Functioning/Environment Level of Independence: Needs assistance  Gait / Transfers Assistance Needed: pt reports she was not using AD for ambulation, but was ambulating with increased difficulty  ADL's / Homemaking Assistance  Needed: Roommate assisting with LB dressing            OT Problem List: Decreased strength;Impaired balance (sitting and/or standing);Pain;Decreased range of motion;Decreased activity tolerance;Decreased knowledge of use of DME or AE;Decreased knowledge of precautions      OT Treatment/Interventions: Self-care/ADL training;DME and/or AE instruction;Therapeutic activities;Balance training;Therapeutic exercise;Patient/family education;Energy conservation    OT Goals(Current goals can be found in the care plan section) Acute Rehab OT Goals Patient Stated Goal: less pain  OT Goal Formulation: With patient Time For Goal Achievement: 05/01/18 Potential to Achieve Goals: Good  OT Frequency: Min 2X/week   Barriers to D/C:            Co-evaluation              AM-PAC PT "6 Clicks" Daily Activity     Outcome Measure Help from another person eating meals?: None Help from another person taking care of personal grooming?: A Little Help from another person toileting, which includes using toliet, bedpan, or urinal?: A Lot Help from another person bathing (including washing, rinsing, drying)?: A Lot Help from another person to put on and taking off regular upper body clothing?: A Little Help from another person to put on and taking off regular lower body clothing?: A Lot 6 Click Score: 16   End of Session Equipment Utilized During Treatment: Gait belt;Rolling walker Nurse Communication: Mobility status  Activity Tolerance: Patient limited by pain  Patient left: in bed;with call bell/phone within reach  OT Visit Diagnosis: Other abnormalities of gait and mobility (R26.89);Pain Pain - part of body: (back )                Time: 1610-9604 OT Time Calculation (min): 25 min Charges:  OT General Charges $OT Visit: 1 Visit OT Evaluation $OT Eval Low Complexity: 1 Low G-Codes:     Marcy Siren, OT Pager [phone] 04/17/2018   Orlando Penner 04/17/2018, 9:44 AM

## 2018-04-17 NOTE — Progress Notes (Signed)
Subjective:  The patient is alert and pleasant. Her back is appropriately sore. She is not getting adequate relief with oxycodone 5 and has required IV morphine. She wants to go home tomorrow.  Objective: Vital signs in last 24 hours: Temp:  [97.9 F (36.6 C)-98.8 F (37.1 C)] 98.8 F (37.1 C) (06/27 0808) Pulse Rate:  [78-88] 88 (06/27 0808) Resp:  [9-20] 17 (06/27 0808) BP: (110-149)/(48-90) 111/48 (06/27 0808) SpO2:  [91 %-100 %] 94 % (06/27 0808) Estimated body mass index is 43.45 kg/m as calculated from the following:   Height as of this encounter: 5\' 5"  (1.651 m).   Weight as of this encounter: 118.4 kg (261 lb 1.6 oz).   Intake/Output from previous day: 06/26 0701 - 06/27 0700 In: 2400 [P.O.:200; I.V.:2100; Blood:100] Out: 960 [Urine:560; Blood:400] Intake/Output this shift: No intake/output data recorded.  Physical exam the patient is alert and pleasant. She is moving her lower extremities well.  Lab Results: Recent Labs    04/17/18 0724  WBC 15.1*  HGB 10.5*  HCT 32.5*  PLT 245   BMET Recent Labs    04/17/18 0724  NA 138  K 3.9  CL 103  CO2 27  GLUCOSE 129*  BUN 10  CREATININE 0.77  CALCIUM 8.3*    Studies/Results: Dg Lumbar Spine 2-3 Views  Result Date: 04/16/2018 CLINICAL DATA:  Status post surgical posterior fusion of L4-5 and L5-S1. EXAM: LUMBAR SPINE - 2-3 VIEW; DG C-ARM 61-120 MIN FLUOROSCOPY TIME:  44 seconds. COMPARISON:  CT scan of February 14, 2018. FINDINGS: Two intraoperative fluoroscopic images of the lumbar spine demonstrates bilateral intrapedicular screw placement of L4, L5 and S1 as well as interbody fusion of these disc spaces. IMPRESSION: Surgical posterior fusion of L4-5 and L5-S1. Electronically Signed   By: Lupita Raider, M.D.   On: 04/16/2018 13:10   Dg Lumbar Spine 1 View  Result Date: 04/16/2018 CLINICAL DATA:  L4-5 PLIF EXAM: LUMBAR SPINE - 1 VIEW COMPARISON:  02/14/2018 FINDINGS: Surgical retractors are noted posterior to  the L4 vertebral body. Surgical instrument is noted at the level of the L4-5 interspace. Radiopaque sponge material is noted as well. Numbering nomenclature is similar to that utilized on the prior exam. IMPRESSION: Intraoperative localization at L4-5 Electronically Signed   By: Alcide Clever M.D.   On: 04/16/2018 11:05   Dg C-arm 1-60 Min  Result Date: 04/16/2018 CLINICAL DATA:  Status post surgical posterior fusion of L4-5 and L5-S1. EXAM: LUMBAR SPINE - 2-3 VIEW; DG C-ARM 61-120 MIN FLUOROSCOPY TIME:  44 seconds. COMPARISON:  CT scan of February 14, 2018. FINDINGS: Two intraoperative fluoroscopic images of the lumbar spine demonstrates bilateral intrapedicular screw placement of L4, L5 and S1 as well as interbody fusion of these disc spaces. IMPRESSION: Surgical posterior fusion of L4-5 and L5-S1. Electronically Signed   By: Lupita Raider, M.D.   On: 04/16/2018 13:10   Dg C-arm 1-60 Min  Result Date: 04/16/2018 CLINICAL DATA:  Status post surgical posterior fusion of L4-5 and L5-S1. EXAM: LUMBAR SPINE - 2-3 VIEW; DG C-ARM 61-120 MIN FLUOROSCOPY TIME:  44 seconds. COMPARISON:  CT scan of February 14, 2018. FINDINGS: Two intraoperative fluoroscopic images of the lumbar spine demonstrates bilateral intrapedicular screw placement of L4, L5 and S1 as well as interbody fusion of these disc spaces. IMPRESSION: Surgical posterior fusion of L4-5 and L5-S1. Electronically Signed   By: Lupita Raider, M.D.   On: 04/16/2018 13:10    Assessment/Plan: Postop day  1: The patient is doing well neurologically. We will increase her oxycodone to 10 mg. I encouraged her to transition to oral pain medications. She will likely go home tomorrow.  LOS: 1 day     Cristi Loron 04/17/2018, 10:19 AM

## 2018-04-17 NOTE — Evaluation (Signed)
Physical Therapy Evaluation Patient Details Name: Cynthia Richardson MRN: [id number] DOB: 1966-03-02 Today's Date: 04/17/2018   History of Present Illness  PT is a 52 y/o female s/p bilateral L4-5 and L5-S1 laminotomy/foraminotomies with decompression, L4-5  transforaminal lumbar interbody fusion and L5-S1 posterior lumbar interbody fusion; PMhx includes prev bil TKAs, anxiety, bipolar disorder, CHF, HTN, seizures  Clinical Impression  Patient is s/p above surgery resulting in the deficits listed below (see PT Problem List). At baseline, patient lives with a roommate and performs limited ambulation without AD. Evaluation limited by patient report of dizziness, nausea and pain. Transferring with min assist using walker and ambulating ~1 foot with walker before requesting to return to bed. VSS. Recommending HHPT to maximize functional independence and safety as well as decrease caregiver burden. Patient will benefit from skilled PT to increase their independence and safety with mobility (while adhering to their precautions) to allow discharge to the venue listed below.     Follow Up Recommendations Home health PT;Supervision for mobility/OOB    Equipment Recommendations  None recommended by PT    Recommendations for Other Services       Precautions / Restrictions Precautions Precautions: Back Precaution Booklet Issued: No((issued by OT)) Precaution Comments: issued and reviewed back precautions Required Braces or Orthoses: Spinal Brace Spinal Brace: Applied in sitting position;Lumbar corset Restrictions Weight Bearing Restrictions: No      Mobility  Bed Mobility Overal bed mobility: Needs Assistance Bed Mobility: Rolling;Sidelying to Sit;Sit to Sidelying Rolling: Min guard Sidelying to sit: Min guard     Sit to sidelying: Min guard General bed mobility comments: patient able to perform log roll technique  Transfers Overall transfer level: Needs assistance Equipment used: Rolling  walker (2 wheeled) Transfers: Sit to/from Stand Sit to Stand: Min assist         General transfer comment: verbal cues for safe hand placement  Ambulation/Gait Ambulation/Gait assistance: Min guard Gait Distance (Feet): 1 Feet Assistive device: Rolling walker (2 wheeled) Gait Pattern/deviations: Decreased stride length     General Gait Details: patient only able to walk ~1 foot forward before experiencing dizziness/nausea and requested to get back in bed  Stairs            Wheelchair Mobility    Modified Rankin (Stroke Patients Only)       Balance Overall balance assessment: Needs assistance Sitting-balance support: Feet supported Sitting balance-Leahy Scale: Good     Standing balance support: Bilateral upper extremity supported Standing balance-Leahy Scale: Poor Standing balance comment: reliant on UE support                              Pertinent Vitals/Pain Pain Assessment: Faces Faces Pain Scale: Hurts whole lot Pain Location: back  Pain Descriptors / Indicators: Grimacing;Guarding;Sore;Moaning Pain Intervention(s): Limited activity within patient's tolerance;Monitored during session;Patient requesting pain meds-RN notified(not due for pain medication)    Home Living Family/patient expects to be discharged to:: Private residence Living Arrangements: Non-relatives/Friends("roommate") Available Help at Discharge: Friend(s);Other (Comment)(almost 24 hr) Type of Home: Mobile home Home Access: Stairs to enter Entrance Stairs-Rails: Right;Left;Can reach both Entrance Stairs-Number of Steps: 4 Home Layout: One level Home Equipment: Cane - single point;Walker - 2 wheels;Shower seat Additional Comments: will have roommate and son ( which is not really her son ) (A) upon d/c    Prior Function Level of Independence: Needs assistance   Gait / Transfers Assistance Needed: pt reports she was not using  AD for ambulation, but was ambulating with  increased difficulty   ADL's / Homemaking Assistance Needed: Roommate assisting with LB dressing        Hand Dominance   Dominant Hand: Right    Extremity/Trunk Assessment   Upper Extremity Assessment Upper Extremity Assessment: Defer to OT evaluation    Lower Extremity Assessment Lower Extremity Assessment: RLE deficits/detail;LLE deficits/detail RLE Deficits / Details: Grossly WFL except hip flexion 2/5. reports lateral thigh tingling LLE Deficits / Details: Grossly 4/5    Cervical / Trunk Assessment Cervical / Trunk Assessment: Other exceptions Cervical / Trunk Exceptions: s/p lumbar surgery   Communication   Communication: No difficulties  Cognition Arousal/Alertness: Awake/alert Behavior During Therapy: WFL for tasks assessed/performed Overall Cognitive Status: Within Functional Limits for tasks assessed                                        General Comments      Exercises     Assessment/Plan    PT Assessment Patient needs continued PT services  PT Problem List Decreased strength;Decreased activity tolerance;Decreased balance;Decreased mobility;Pain       PT Treatment Interventions DME instruction;Gait training;Stair training;Functional mobility training;Therapeutic activities;Therapeutic exercise;Balance training;Patient/family education;Neuromuscular re-education    PT Goals (Current goals can be found in the Care Plan section)  Acute Rehab PT Goals Patient Stated Goal: less pain  PT Goal Formulation: With patient Time For Goal Achievement: 04/19/18 Potential to Achieve Goals: Fair    Frequency Min 5X/week   Barriers to discharge        Co-evaluation               AM-PAC PT "6 Clicks" Daily Activity  Outcome Measure Difficulty turning over in bed (including adjusting bedclothes, sheets and blankets)?: A Little Difficulty moving from lying on back to sitting on the side of the bed? : A Lot Difficulty sitting down on and  standing up from a chair with arms (e.g., wheelchair, bedside commode, etc,.)?: A Little Help needed moving to and from a bed to chair (including a wheelchair)?: A Little Help needed walking in hospital room?: A Little Help needed climbing 3-5 steps with a railing? : A Lot 6 Click Score: 16    End of Session Equipment Utilized During Treatment: Gait belt;Back brace Activity Tolerance: Patient limited by pain Patient left: in bed;with call bell/phone within reach Nurse Communication: Mobility status PT Visit Diagnosis: Unsteadiness on feet (R26.81);Muscle weakness (generalized) (M62.81);Difficulty in walking, not elsewhere classified (R26.2)    Time: 8412-8208 PT Time Calculation (min) (ACUTE ONLY): 20 min   Charges:   PT Evaluation $PT Eval Moderate Complexity: 1 Mod     PT G Codes:        Laurina Bustle, PT, DPT Acute Rehabilitation Services  Pager: [phone]   Vanetta Mulders 04/17/2018, 11:03 AM

## 2018-04-17 NOTE — Care Management Note (Addendum)
Case Management Note  Patient Details  Name: Cynthia Richardson MRN: [id number] Date of Birth: [birth year]  Subjective/Objective:   NCM spoke with patient about HHPT/HHOT, she is preset up with [facility] . Soc will begin 24-48 hrs post dc.  She will also need 3 n 1.                                 Action/Plan: DC home when ready.   Expected Discharge Date:                  Expected Discharge Plan:  Home w Home Health Services  In-House Referral:     Discharge planning Services  CM Consult  Post Acute Care Choice:  Home Health Choice offered to:  Patient  DME Arranged:  3-N-1 DME Agency:  [facility]  HH Arranged:  PT, OT [facility] Agency:  [facility]  Status of Service:  Completed, signed off  If discussed at Long Length of Stay Meetings, dates discussed:    Additional Comments:  Leone Haven, RN 04/17/2018, 1:29 PM

## 2018-04-18 MED ORDER — OXYCODONE HCL 10 MG PO TABS: 10.0000 mg | ORAL_TABLET | ORAL | 0 refills | Status: DC | PRN

## 2018-04-18 MED ORDER — CYCLOBENZAPRINE HCL 10 MG PO TABS: 10.0000 mg | ORAL_TABLET | Freq: Three times a day (TID) | ORAL | 1 refills | Status: DC | PRN

## 2018-04-18 MED ORDER — DOCUSATE SODIUM 100 MG PO CAPS: 100.0000 mg | ORAL_CAPSULE | Freq: Two times a day (BID) | ORAL | 0 refills | Status: DC

## 2018-04-18 NOTE — Progress Notes (Signed)
Patient alert and oriented, mae's well, voiding adequate amount of urine, swallowing without difficulty, c/o pain at time of discharge, even though medication was given prior to discharged. Patient discharged home with family. Script and discharged instructions given to patient. Patient and family stated understanding of instructions given. Patient has an appointment with Dr. Lovell Sheehan

## 2018-04-18 NOTE — Progress Notes (Signed)
OT Cancellation Note  Patient Details Name: ELIZABETHROSE JOHANSEN MRN: [id number] DOB: 07-27-1966   Cancelled Treatment:    Reason Eval/Treat Not Completed: Pain limiting ability to participate. Reinforced assistance from roommate with all mobility and self care, recommend HHOT to follow up.  Will continue to follow while admitted.   Chancy Milroy 04/18/2018, 12:18 PM

## 2018-04-18 NOTE — Progress Notes (Signed)
Occupational Therapy Treatment Patient Details Name: Cynthia Richardson MRN: [id number] DOB: Sep 14, 1966 Today's Date: 04/18/2018    History of present illness PT is a 52 y/o female s/p bilateral L4-5 and L5-S1 laminotomy/foraminotomies with decompression, L4-5  transforaminal lumbar interbody fusion and L5-S1 posterior lumbar interbody fusion; PMhx includes prev bil TKAs, anxiety, bipolar disorder, CHF, HTN, seizures   OT comments  Patient significantly limited by pain this session. Continues to require maxA for LB dressing, min assist for toileting, supervision for UB dressing and management of brace, and min assistance for safety.  Able to recall 3/3 back precautions, but poor safety as patient standing without RW and required minA to maintain balance.  She will benefit from another OT session prior to dc to review AE and toilet transfers further, as she was unable to tolerate further OT at this time due to pain.  Self limiting, as able to complete EOB to sidelying (following log roll technique) with supervision.  She was educated to have assistance at this time with LB dressing, toileting, transfers and mobility from roommate and using RW.  Will attempt to see patient again prior to dc. Continue to recommend 24/7 supervision/assistance and home health OT. Will continue to follow while admitted.    Follow Up Recommendations  Home health OT;Supervision/Assistance - 24 hour    Equipment Recommendations  3 in 1 bedside commode    Recommendations for Other Services      Precautions / Restrictions Precautions Precautions: Back Precaution Booklet Issued: No(issued in prior session) Precaution Comments: reviewed back precautions, able to recall 3/3  Required Braces or Orthoses: Spinal Brace Spinal Brace: Applied in sitting position;Lumbar corset Restrictions Weight Bearing Restrictions: No       Mobility Bed Mobility Overal bed mobility: Needs Assistance Bed Mobility: Sit to Sidelying          Sit to sidelying: Supervision General bed mobility comments: patient able to perform log roll technique  Transfers Overall transfer level: Needs assistance Equipment used: Rolling walker (2 wheeled) Transfers: Sit to/from Stand Sit to Stand: Min guard         General transfer comment: verbal cues for safe hand placement, and use of rolling walker     Balance Overall balance assessment: Needs assistance Sitting-balance support: No upper extremity supported;Feet supported Sitting balance-Leahy Scale: Good     Standing balance support: Bilateral upper extremity supported Standing balance-Leahy Scale: Poor Standing balance comment: reliant on UE support                            ADL either performed or assessed with clinical judgement   ADL Overall ADL's : Needs assistance/impaired                 Upper Body Dressing : Supervision/safety;Sitting Upper Body Dressing Details (indicate cue type and reason): cueing for brace management Lower Body Dressing: Maximal assistance;Sit to/from stand Lower Body Dressing Details (indicate cue type and reason): min guard in standing, requires assistance to reach B feet--will benefit from AE training, limited by pain  Toilet Transfer: Minimal assistance;Stand-pivot;BSC;RW Toilet Transfer Details (indicate cue type and reason): simulated in transfer to/from EOB, pt limited by pain and unable to complete further mobility at this time Toileting- Clothing Manipulation and Hygiene: Minimal assistance;Sitting/lateral lean;Cueing for back precautions Toileting - Clothing Manipulation Details (indicate cue type and reason): requires min guard in standing for safety/balance, limited by pain(education on having roomates assistance )   Web designer  Details (indicate cue type and reason): needs further training, education provided on having HHOT assist  Functional mobility during ADLs: Minimal assistance;Rolling walker General  ADL Comments: significantly limited by pain, requires encouragement but unable to tolerate further mobility and self care training after dressing      Vision       Perception     Praxis      Cognition Arousal/Alertness: Awake/alert Behavior During Therapy: Restless;Anxious(limited by pain) Overall Cognitive Status: Within Functional Limits for tasks assessed                                          Exercises     Shoulder Instructions       General Comments significantly limited by pain     Pertinent Vitals/ Pain       Pain Assessment: 0-10 Pain Score: 9  Pain Location: back  Pain Descriptors / Indicators: Discomfort;Grimacing;Guarding;Aching;Constant Pain Intervention(s): Limited activity within patient's tolerance;Repositioned(nursing notified)  Home Living                                          Prior Functioning/Environment              Frequency  Min 2X/week        Progress Toward Goals  OT Goals(current goals can now be found in the care plan section)  Progress towards OT goals: Progressing toward goals  Acute Rehab OT Goals Patient Stated Goal: less pain  OT Goal Formulation: With patient Time For Goal Achievement: 05/01/18 Potential to Achieve Goals: Good  Plan Discharge plan remains appropriate;Frequency remains appropriate    Co-evaluation                 AM-PAC PT "6 Clicks" Daily Activity     Outcome Measure   Help from another person eating meals?: None Help from another person taking care of personal grooming?: A Little Help from another person toileting, which includes using toliet, bedpan, or urinal?: A Lot Help from another person bathing (including washing, rinsing, drying)?: A Lot Help from another person to put on and taking off regular upper body clothing?: None Help from another person to put on and taking off regular lower body clothing?: A Lot 6 Click Score: 17    End of  Session Equipment Utilized During Treatment: Gait belt;Rolling walker  OT Visit Diagnosis: Other abnormalities of gait and mobility (R26.89);Pain Pain - part of body: (back)   Activity Tolerance Patient limited by pain   Patient Left in bed;with call bell/phone within reach   Nurse Communication Mobility status;Patient requests pain meds        Time: 1610-9604 OT Time Calculation (min): 16 min  Charges: OT General Charges $OT Visit: 1 Visit OT Treatments $Self Care/Home Management : 8-22 mins  Chancy Milroy, OTR/L  Pager [phone]   Chancy Milroy 04/18/2018, 9:12 AM

## 2018-04-18 NOTE — Discharge Summary (Signed)
Physician Discharge Summary  Patient ID: ALEJAH DEMARCHI MRN: [id number] DOB/AGE: 1966-04-05 52 y.o.  Admit date: 04/16/2018 Discharge date: 04/18/2018  Admission Diagnoses: L4-5 and L5-S1 degenerative disc disease, spondylolisthesis, spinal stenosis, lumbago, lumbar radiculopathy, neurogenic claudication  Discharge Diagnoses: The same Active Problems:   Spondylolisthesis of lumbar region   Discharged Condition: good  Hospital Course: I performed an L4-5 and L5-S1 decompression, instrumentation, and fusion on the patient on 04/16/2018.  The surgery went well.  The patient's postoperative course was unremarkable.  On postoperative day #2, i.e. 04/10/2018, the patient requested discharge home.  She was given oral and written discharge instructions.  All her questions were answered.  Consults: Physical therapy Significant Diagnostic Studies: None Treatments: L4-5 and L5-S1 decompression, instrumentation, and fusion. Discharge Exam: Blood pressure 108/61, pulse 86, temperature 98.6 F (37 C), temperature source Oral, resp. rate 18, height 5\' 5"  (1.651 m), weight 118.4 kg (261 lb 1.6 oz), SpO2 97 %. The patient is alert and pleasant.  Her lower extremity strength is normal.  She looks well.  Disposition: Home  Discharge Instructions    Call MD for:  difficulty breathing, headache or visual disturbances   Complete by:  As directed    Call MD for:  extreme fatigue   Complete by:  As directed    Call MD for:  hives   Complete by:  As directed    Call MD for:  persistant dizziness or light-headedness   Complete by:  As directed    Call MD for:  persistant nausea and vomiting   Complete by:  As directed    Call MD for:  redness, tenderness, or signs of infection (pain, swelling, redness, odor or green/yellow discharge around incision site)   Complete by:  As directed    Call MD for:  severe uncontrolled pain   Complete by:  As directed    Call MD for:  temperature >100.4   Complete by:   As directed    Diet - low sodium heart healthy   Complete by:  As directed    Discharge instructions   Complete by:  As directed    Call [phone] for a followup appointment. Take a stool softener while you are using pain medications.   Driving Restrictions   Complete by:  As directed    Do not drive for 2 weeks.   Increase activity slowly   Complete by:  As directed    Lifting restrictions   Complete by:  As directed    Do not lift more than 5 pounds. No excessive bending or twisting.   May shower / Bathe   Complete by:  As directed    Remove the dressing for 3 days after surgery.  You may shower, but leave the incision alone.   Remove dressing in 24 hours   Complete by:  As directed      Allergies as of 04/18/2018   No Known Allergies     Medication List    STOP taking these medications   HYDROcodone-acetaminophen 10-325 MG tablet Commonly known as:  NORCO   methocarbamol 500 MG tablet Commonly known as:  ROBAXIN   traMADol 50 MG tablet Commonly known as:  ULTRAM     TAKE these medications   acetaminophen 500 MG tablet Commonly known as:  TYLENOL Take 1,000 mg by mouth every 6 (six) hours as needed for moderate pain or headache.   amLODipine 2.5 MG tablet Commonly known as:  NORVASC Take 2.5 mg by mouth  daily.   ARIPiprazole 15 MG tablet Commonly known as:  ABILIFY Take 15 mg by mouth daily.   aspirin 325 MG EC tablet Take 1 tablet (325 mg total) by mouth 2 (two) times daily.   atenolol-chlorthalidone 100-25 MG tablet Commonly known as:  TENORETIC Take 1 tablet by mouth daily.   cyclobenzaprine 10 MG tablet Commonly known as:  FLEXERIL Take 1 tablet (10 mg total) by mouth 3 (three) times daily as needed for muscle spasms.   docusate sodium 100 MG capsule Commonly known as:  COLACE Take 1 capsule (100 mg total) by mouth 2 (two) times daily.   DULoxetine 60 MG capsule Commonly known as:  CYMBALTA Take 120 mg by mouth daily.   oxcarbazepine 600  MG tablet Commonly known as:  TRILEPTAL Take 1,200 mg by mouth daily.   Oxycodone HCl 10 MG Tabs Take 1 tablet (10 mg total) by mouth every 4 (four) hours as needed for severe pain ((score 7 to 10)).   traZODone 100 MG tablet Commonly known as:  DESYREL Take 200 mg by mouth at bedtime.            Durable Medical Equipment  (From admission, onward)        Start     Ordered   04/17/18 1226  For home use only DME 3 n 1  Once     04/17/18 1225     Follow-up Information    [facility] - Dme Follow up.   Why:  3 n 1 Contact information: [address] [place] [address] [phone]        [facility] Follow up.   Specialty:  Home Health Services Why:  HHPT, HHOT Contact information: [address] [place] [address] [phone]           Signed: Cristi Loron 04/18/2018, 6:46 AM

## 2018-08-28 DIAGNOSIS — R52 Pain, unspecified: Secondary | ICD-10-CM | POA: Diagnosis not present

## 2018-08-28 DIAGNOSIS — I10 Essential (primary) hypertension: Secondary | ICD-10-CM | POA: Diagnosis not present

## 2018-08-28 DIAGNOSIS — E669 Obesity, unspecified: Secondary | ICD-10-CM | POA: Diagnosis not present

## 2018-08-28 DIAGNOSIS — E663 Overweight: Secondary | ICD-10-CM | POA: Diagnosis not present

## 2018-08-28 DIAGNOSIS — R0602 Shortness of breath: Secondary | ICD-10-CM | POA: Diagnosis not present

## 2019-02-12 DIAGNOSIS — M25551 Pain in right hip: Secondary | ICD-10-CM | POA: Insufficient documentation

## 2019-03-12 DIAGNOSIS — M1611 Unilateral primary osteoarthritis, right hip: Secondary | ICD-10-CM | POA: Insufficient documentation

## 2019-03-28 DIAGNOSIS — K122 Cellulitis and abscess of mouth: Secondary | ICD-10-CM | POA: Diagnosis not present

## 2020-01-01 DIAGNOSIS — R079 Chest pain, unspecified: Secondary | ICD-10-CM

## 2020-01-02 DIAGNOSIS — F909 Attention-deficit hyperactivity disorder, unspecified type: Secondary | ICD-10-CM

## 2020-01-02 DIAGNOSIS — R079 Chest pain, unspecified: Secondary | ICD-10-CM | POA: Diagnosis not present

## 2020-01-02 DIAGNOSIS — R0789 Other chest pain: Secondary | ICD-10-CM

## 2020-01-02 DIAGNOSIS — F319 Bipolar disorder, unspecified: Secondary | ICD-10-CM

## 2021-01-17 DIAGNOSIS — F411 Generalized anxiety disorder: Secondary | ICD-10-CM | POA: Diagnosis not present

## 2021-01-17 DIAGNOSIS — F1120 Opioid dependence, uncomplicated: Secondary | ICD-10-CM | POA: Diagnosis not present

## 2021-01-17 DIAGNOSIS — Z79899 Other long term (current) drug therapy: Secondary | ICD-10-CM | POA: Diagnosis not present

## 2021-08-28 DIAGNOSIS — R0602 Shortness of breath: Secondary | ICD-10-CM

## 2021-08-28 DIAGNOSIS — R0789 Other chest pain: Secondary | ICD-10-CM

## 2021-09-14 DIAGNOSIS — R079 Chest pain, unspecified: Secondary | ICD-10-CM | POA: Diagnosis not present

## 2021-09-15 DIAGNOSIS — E785 Hyperlipidemia, unspecified: Secondary | ICD-10-CM

## 2021-09-15 DIAGNOSIS — R9439 Abnormal result of other cardiovascular function study: Secondary | ICD-10-CM | POA: Diagnosis not present

## 2021-09-15 DIAGNOSIS — I10 Essential (primary) hypertension: Secondary | ICD-10-CM | POA: Diagnosis not present

## 2021-09-15 DIAGNOSIS — R079 Chest pain, unspecified: Secondary | ICD-10-CM

## 2021-09-16 DIAGNOSIS — E785 Hyperlipidemia, unspecified: Secondary | ICD-10-CM | POA: Diagnosis not present

## 2021-09-16 DIAGNOSIS — R079 Chest pain, unspecified: Secondary | ICD-10-CM | POA: Diagnosis not present

## 2021-09-16 DIAGNOSIS — I10 Essential (primary) hypertension: Secondary | ICD-10-CM | POA: Diagnosis not present

## 2021-09-16 DIAGNOSIS — R9439 Abnormal result of other cardiovascular function study: Secondary | ICD-10-CM | POA: Diagnosis not present

## 2021-09-19 ENCOUNTER — Telehealth: Payer: Self-pay

## 2021-09-19 DIAGNOSIS — R079 Chest pain, unspecified: Secondary | ICD-10-CM

## 2021-09-19 MED ORDER — METOPROLOL TARTRATE 100 MG PO TABS: 100.0000 mg | ORAL_TABLET | Freq: Once | ORAL | 0 refills | Status: DC

## 2021-09-19 NOTE — Telephone Encounter (Signed)
Spoke with patient regarding  recommendation and instructions.   Patient verbalizes understanding and is agreeable to plan of care. Advised patient to call back with any issues or concerns.

## 2021-09-19 NOTE — Telephone Encounter (Signed)
Called the patient just now but the number is out of service. I called her emergency contact as well and left a message for them to call us back on her voicemail as I have no other way to contact the patient. The below instructions are ready for the patient.   Your cardiac CT will be scheduled at the below location:   [facility] [address] [place], [place] [address] [phone]  If scheduled at [facility], please arrive at the [facility] main entrance (entrance A) of [facility] 30 minutes prior to test start time. You can use the FREE valet parking offered at the main entrance (encouraged to control the heart rate for the test) Proceed to the [facility] Radiology Department (first floor) to check-in and test prep.  Please follow these instructions carefully (unless otherwise directed):  On the Night Before the Test: Be sure to Drink plenty of water. Do not consume any caffeinated/decaffeinated beverages or chocolate 12 hours prior to your test. Do not take any antihistamines 12 hours prior to your test.  On the Day of the Test: Drink plenty of water until 1 hour prior to the test. Do not eat any food 4 hours prior to the test. You may take your regular medications prior to the test.  Take metoprolol (Lopressor) two hours prior to test. FEMALES- please wear underwire-free bra if available, avoid dresses & tight clothing      After the Test: Drink plenty of water. After receiving IV contrast, you may experience a mild flushed feeling. This is normal. On occasion, you may experience a mild rash up to 24 hours after the test. This is not dangerous. If this occurs, you can take Benadryl 25 mg and increase your fluid intake. If you experience trouble breathing, this can be serious. If it is severe call 911 IMMEDIATELY. If it is mild, please call our office. If you take any of these medications: Glipizide/Metformin, Avandament, Glucavance, please do not  take 48 hours after completing test unless otherwise instructed.  Please allow 2-4 weeks for scheduling of routine cardiac CTs. Some insurance companies require a pre-authorization which may delay scheduling of this test.   For non-scheduling related questions, please contact the cardiac imaging nurse navigator should you have any questions/concerns: Rockwell Alexandria, Cardiac Imaging Nurse Navigator Larey Brick, Cardiac Imaging Nurse Navigator [facility] Direct Office Dial: [phone]   For scheduling needs, including cancellations and rescheduling, please call [place], [phone].

## 2021-09-19 NOTE — Telephone Encounter (Signed)
-----   Message from Baldo Daub, MD sent at 09/15/2021  1:50 PM EST ----- Seen at [facility] abnormal myocardial perfusion study please set up cardiac CTA see me 1 week later in the office in follow-up.

## 2021-11-07 DIAGNOSIS — M5136 Other intervertebral disc degeneration, lumbar region: Secondary | ICD-10-CM | POA: Insufficient documentation

## 2021-11-07 DIAGNOSIS — Z87442 Personal history of urinary calculi: Secondary | ICD-10-CM | POA: Insufficient documentation

## 2021-11-07 DIAGNOSIS — M549 Dorsalgia, unspecified: Secondary | ICD-10-CM | POA: Insufficient documentation

## 2021-11-07 DIAGNOSIS — M170 Bilateral primary osteoarthritis of knee: Secondary | ICD-10-CM | POA: Insufficient documentation

## 2021-11-07 DIAGNOSIS — M1990 Unspecified osteoarthritis, unspecified site: Secondary | ICD-10-CM | POA: Insufficient documentation

## 2021-11-07 DIAGNOSIS — F419 Anxiety disorder, unspecified: Secondary | ICD-10-CM | POA: Insufficient documentation

## 2021-11-07 DIAGNOSIS — R519 Headache, unspecified: Secondary | ICD-10-CM | POA: Insufficient documentation

## 2021-11-07 DIAGNOSIS — I674 Hypertensive encephalopathy: Secondary | ICD-10-CM | POA: Insufficient documentation

## 2021-11-07 DIAGNOSIS — R0600 Dyspnea, unspecified: Secondary | ICD-10-CM | POA: Insufficient documentation

## 2021-11-07 DIAGNOSIS — J189 Pneumonia, unspecified organism: Secondary | ICD-10-CM | POA: Insufficient documentation

## 2021-11-07 DIAGNOSIS — G8929 Other chronic pain: Secondary | ICD-10-CM | POA: Insufficient documentation

## 2021-11-07 DIAGNOSIS — I509 Heart failure, unspecified: Secondary | ICD-10-CM | POA: Insufficient documentation

## 2021-11-07 DIAGNOSIS — R569 Unspecified convulsions: Secondary | ICD-10-CM | POA: Insufficient documentation

## 2021-11-09 ENCOUNTER — Ambulatory Visit: Payer: Medicare Other | Admitting: Cardiology

## 2021-11-09 NOTE — Progress Notes (Deleted)
Cardiology Office Note:    Date:  11/09/2021   ID:  Cynthia Richardson, DOB [birth year], MRN [id number]  PCP:  Claybon Jabs, PA-C  Cardiologist:  Norman Herrlich, MD    Referring MD: Claybon Jabs, PA-C    ASSESSMENT:    No diagnosis found. PLAN:    In order of problems listed above:  ***   Next appointment: ***   Medication Adjustments/Labs and Tests Ordered: Current medicines are reviewed at length with the patient today.  Concerns regarding medicines are outlined above.  No orders of the defined types were placed in this encounter.  No orders of the defined types were placed in this encounter.   No chief complaint on file.   History of Present Illness:    Cynthia Richardson is a 56 y.o. female with a hx of chest pain and abnormal myocardial perfusion study hypertension hyperlipidemia last seen 09/16/2021 at [facility].  The patient opted to undergo cardiac CTA as an outpatient as opposed to coronary angiography.  It was never done.  The perfusion study showed a defect in the edge wall of the left ventricle with concerns of ischemia and normal left ventricular function and I reviewed the imaging but there is a significant degree of attenuation.  She did not have acute coronary syndrome.  Her EKG was normal troponins were undetectable. Compliance with diet, lifestyle and medications: *** Past Medical History:  Diagnosis Date   Anxiety    Arthritis    Bipolar disorder (HCC)    Bulging lumbar disc    CHF (congestive heart failure) (HCC)    2011   Chronic back pain    Degenerative disc disease    Depression    Dyspnea    Headache    History of kidney stones    Hypertension    Hypertensive encephalopathy    Nonischemic cardiomyopathy (HCC)    stress inducted (Takotsubo type) 12/2009 in the setting of unintentional drug OD with acute respiratory and renal failure; No CAD by 12/2009 cath; EF recovered (> 55% '15, '17)   Osteoarthritis of knees, bilateral    Pneumonia     Respiratory failure (HCC)    March 2011   Seizures [facility])     Past Surgical History:  Procedure Laterality Date   ABDOMINAL HYSTERECTOMY     APPENDECTOMY     CHOLECYSTECTOMY     JOINT REPLACEMENT     TONSILLECTOMY     TOTAL KNEE ARTHROPLASTY Right 05/13/2017   Procedure: RIGHT TOTAL KNEE ARTHROPLASTY;  Surgeon: Dannielle Huh, MD;  Location: [facility] OR;  Service: Orthopedics;  Laterality: Right;   TOTAL KNEE ARTHROPLASTY Left 08/19/2017   Procedure: TOTAL KNEE ARTHROPLASTY;  Surgeon: Dannielle Huh, MD;  Location: [facility] OR;  Service: Orthopedics;  Laterality: Left;    Current Medications: No outpatient medications have been marked as taking for the 11/09/21 encounter (Appointment) with Baldo Daub, MD.     Allergies:   Patient has no known allergies.   Social History   Socioeconomic History   Marital status: Divorced    Spouse name: Not on file   Number of children: Not on file   Years of education: Not on file   Highest education level: Not on file  Occupational History   Not on file  Tobacco Use   Smoking status: Never   Smokeless tobacco: Never  Vaping Use   Vaping Use: Never used  Substance and Sexual Activity   Alcohol use: No   Drug use: No  Types: Hydrocodone, Hydromorphone, Benzodiazepines, Cocaine    Comment: last used 09/05/2011   Sexual activity: Never  Other Topics Concern   Not on file  Social History Narrative   Not on file   Social Determinants of Health   Financial Resource Strain: Not on file  Food Insecurity: Not on file  Transportation Needs: Not on file  Physical Activity: Not on file  Stress: Not on file  Social Connections: Not on file     Family History: The patient's ***family history includes Emphysema in her mother; Hypertension in her mother; Liver disease in her father. ROS:   Please see the history of present illness.    All other systems reviewed and are negative.  EKGs/Labs/Other Studies Reviewed:    The following studies were  reviewed today:  EKG:  EKG ordered today and personally reviewed.  The ekg ordered today demonstrates ***  Recent Labs: No results found for requested labs within last 8760 hours.  Recent Lipid Panel No results found for: CHOL, TRIG, HDL, CHOLHDL, VLDL, LDLCALC, LDLDIRECT  Physical Exam:    VS:  There were no vitals taken for this visit.    Wt Readings from Last 3 Encounters:  04/16/18 261 lb 1.6 oz (118.4 kg)  04/08/18 261 lb 1.6 oz (118.4 kg)  08/22/17 264 lb 8.8 oz (120 kg)     GEN: *** Well nourished, well developed in no acute distress HEENT: Normal NECK: No JVD; No carotid bruits LYMPHATICS: No lymphadenopathy CARDIAC: ***RRR, no murmurs, rubs, gallops RESPIRATORY:  Clear to auscultation without rales, wheezing or rhonchi  ABDOMEN: Soft, non-tender, non-distended MUSCULOSKELETAL:  No edema; No deformity  SKIN: Warm and dry NEUROLOGIC:  Alert and oriented x 3 PSYCHIATRIC:  Normal affect    Signed, Norman Herrlich, MD  11/09/2021 12:01 PM    [facility]

## 2021-11-10 ENCOUNTER — Encounter (HOSPITAL_COMMUNITY): Payer: Self-pay

## 2021-11-16 ENCOUNTER — Telehealth: Payer: Self-pay | Admitting: Cardiology

## 2021-11-16 NOTE — Telephone Encounter (Signed)
Place patient on hold to look at schedule when I can back patient was no longer on the phone.

## 2021-12-11 ENCOUNTER — Encounter (HOSPITAL_BASED_OUTPATIENT_CLINIC_OR_DEPARTMENT_OTHER): Payer: Medicare Other | Attending: Internal Medicine | Admitting: General Surgery

## 2021-12-11 ENCOUNTER — Other Ambulatory Visit: Payer: Self-pay

## 2021-12-11 DIAGNOSIS — Z6841 Body Mass Index (BMI) 40.0 and over, adult: Secondary | ICD-10-CM | POA: Insufficient documentation

## 2021-12-11 DIAGNOSIS — I5022 Chronic systolic (congestive) heart failure: Secondary | ICD-10-CM | POA: Insufficient documentation

## 2021-12-11 DIAGNOSIS — S30811A Abrasion of abdominal wall, initial encounter: Secondary | ICD-10-CM | POA: Diagnosis not present

## 2021-12-11 DIAGNOSIS — L03115 Cellulitis of right lower limb: Secondary | ICD-10-CM | POA: Insufficient documentation

## 2021-12-11 DIAGNOSIS — L03116 Cellulitis of left lower limb: Secondary | ICD-10-CM | POA: Insufficient documentation

## 2021-12-11 DIAGNOSIS — I890 Lymphedema, not elsewhere classified: Secondary | ICD-10-CM | POA: Diagnosis not present

## 2021-12-11 DIAGNOSIS — X58XXXA Exposure to other specified factors, initial encounter: Secondary | ICD-10-CM | POA: Diagnosis not present

## 2021-12-11 NOTE — Progress Notes (Signed)
Cynthia Richardson, Cynthia Richardson ([id number]) Visit Report for 12/11/2021 Abuse Risk Screen Details Patient Name: Date of Service: HA Landis Martins. 12/11/2021 2:00 PM Medical Record Number: [id number] Patient Account Number: [id number] Date of Birth/Sex: Treating RN: [birth year] (56 y.o. Katrinka Blazing Primary Care Margaree Sandhu: Claybon Jabs Other Clinician: Referring Alizey Noren: Treating Haruye Lainez/Extender: Pattricia Boss in Treatment: 0 Abuse Risk Screen Items Answer ABUSE RISK SCREEN: Has anyone close to you tried to hurt or harm you recentlyo No Do you feel uncomfortable with anyone in your familyo No Has anyone forced you do things that you didnt want to doo No Electronic Signature(s) Signed: 12/11/2021 6:04:41 PM By: Karie Schwalbe RN Entered By: Karie Schwalbe on 12/11/2021 14:38:43 -------------------------------------------------------------------------------- Activities of Daily Living Details Patient Name: Date of Service: HA LL, A NGELA M. 12/11/2021 2:00 PM Medical Record Number: [id number] Patient Account Number: [id number] Date of Birth/Sex: Treating RN: [birth year] (56 y.o. Katrinka Blazing Primary Care Conita Amenta: Claybon Jabs Other Clinician: Referring Cassidy Tashiro: Treating Valente Fosberg/Extender: Pattricia Boss in Treatment: 0 Activities of Daily Living Items Answer Activities of Daily Living (Please select one for each item) Drive Automobile Not Able T Medications ake Completely Able Use T elephone Completely Able Care for Appearance Completely Able Use T oilet Completely Able Bath / Shower Completely Able Dress Self Completely Able Feed Self Completely Able Walk Completely Able Get In / Out Bed Completely Able Housework Need Assistance Prepare Meals Need Assistance Handle Money Completely Able Shop for Self Completely Able Electronic Signature(s) Signed: 12/11/2021 6:04:41 PM By: Karie Schwalbe RN Entered By: Karie Schwalbe on 12/11/2021  14:41:22 -------------------------------------------------------------------------------- Education Screening Details Patient Name: Date of Service: [name], A NGELA M. 12/11/2021 2:00 PM Medical Record Number: [id number] Patient Account Number: [id number] Date of Birth/Sex: Treating RN: 1966/01/21 (56 y.o. Katrinka Blazing Primary Care Taiwo Fish: Claybon Jabs Other Clinician: Referring Karolyna Bianchini: Treating Champ Keetch/Extender: Pattricia Boss in Treatment: 0 Primary Learner Assessed: Patient Learning Preferences/Education Level/Primary Language Learning Preference: Explanation, Demonstration, Printed Material Highest Education Level: College or Above Preferred Language: English Cognitive Barrier Language Barrier: No Translator Needed: No Memory Deficit: No Emotional Barrier: No Cultural/Religious Beliefs Affecting Medical Care: No Physical Barrier Impaired Vision: No Impaired Hearing: No Decreased Hand dexterity: No Knowledge/Comprehension Knowledge Level: High Comprehension Level: High Ability to understand written instructions: High Ability to understand verbal instructions: High Motivation Anxiety Level: Calm Cooperation: Cooperative Education Importance: Acknowledges Need Interest in Health Problems: Asks Questions Perception: Coherent Willingness to Engage in Self-Management High Activities: Readiness to Engage in Self-Management High Activities: Electronic Signature(s) Signed: 12/11/2021 6:04:41 PM By: Karie Schwalbe RN Entered By: Karie Schwalbe on 12/11/2021 14:42:10 -------------------------------------------------------------------------------- Fall Risk Assessment Details Patient Name: Date of Service: HA LL, A NGELA M. 12/11/2021 2:00 PM Medical Record Number: [id number] Patient Account Number: [id number] Date of Birth/Sex: Treating RN: 07-Nov-1965 (56 y.o. Katrinka Blazing Primary Care Daegen Berrocal: Claybon Jabs Other Clinician: Referring  Heily Carlucci: Treating Bindu Docter/Extender: Pattricia Boss in Treatment: 0 Fall Risk Assessment Items Have you had 2 or more falls in the last 12 monthso 0 No Have you had any fall that resulted in injury in the last 12 monthso 0 Yes FALLS RISK SCREEN History of falling - immediate or within 3 months 25 Yes Secondary diagnosis (Do you have 2 or more medical diagnoseso) 0 No Ambulatory aid None/bed rest/wheelchair/nurse 0 No Crutches/cane/walker 0 No Furniture 30 Yes Intravenous therapy Access/Saline/Heparin Lock 0 No Gait/Transferring Normal/ bed rest/ wheelchair 0 No Weak (short  steps with or without shuffle, stooped but able to lift head while walking, may seek 0 No support from furniture) Impaired (short steps with shuffle, may have difficulty arising from chair, head down, impaired 0 No balance) Mental Status Oriented to own ability 0 No Electronic Signature(s) Signed: 12/11/2021 6:04:41 PM By: Karie Schwalbe RN Entered By: Karie Schwalbe on 12/11/2021 14:43:52 -------------------------------------------------------------------------------- Foot Assessment Details Patient Name: Date of Service: HA LL, A NGELA M. 12/11/2021 2:00 PM Medical Record Number: [id number] Patient Account Number: [id number] Date of Birth/Sex: Treating RN: July 19, 1966 (56 y.o. Katrinka Blazing Primary Care Bettey Muraoka: Claybon Jabs Other Clinician: Referring [place]ia Noori: Treating Jashayla Glatfelter/Extender: Pattricia Boss in Treatment: 0 Foot Assessment Items Site Locations + = Sensation present, - = Sensation absent, C = Callus, U = Ulcer R = Redness, W = Warmth, M = Maceration, PU = Pre-ulcerative lesion F = Fissure, S = Swelling, D = Dryness Assessment Right: Left: Other Deformity: No No Prior Foot Ulcer: No No Prior Amputation: No No Charcot Joint: No No Ambulatory Status: Ambulatory Without Help Gait: Steady Electronic Signature(s) Signed: 12/11/2021 6:04:41 PM  By: Karie Schwalbe RN Entered By: Karie Schwalbe on 12/11/2021 14:56:50 -------------------------------------------------------------------------------- Nutrition Risk Screening Details Patient Name: Date of Service: HA LL, A NGELA M. 12/11/2021 2:00 PM Medical Record Number: [id number] Patient Account Number: [id number] Date of Birth/Sex: Treating RN: [birth year] (56 y.o. Katrinka Blazing Primary Care Yazhini Mcaulay: Claybon Jabs Other Clinician: Referring Champayne Kocian: Treating Madelyn Tlatelpa/Extender: Pattricia Boss in Treatment: 0 Height (in): 64 Weight (lbs): 280 Body Mass Index (BMI): 48.1 Nutrition Risk Screening Items Score Screening NUTRITION RISK SCREEN: I have an illness or condition that made me change the kind and/or amount of food I eat 0 No I eat fewer than two meals per day 0 No I eat few fruits and vegetables, or milk products 0 No I have three or more drinks of beer, liquor or wine almost every day 0 No I have tooth or mouth problems that make it hard for me to eat 0 No I don't always have enough money to buy the food I need 0 No I eat alone most of the time 0 No I take three or more different prescribed or over-the-counter drugs a day 1 Yes Without wanting to, I have lost or gained 10 pounds in the last six months 2 Yes I am not always physically able to shop, cook and/or feed myself 0 No Nutrition Protocols Good Risk Protocol Moderate Risk Protocol 0 Provide education on nutrition High Risk Proctocol Risk Level: Moderate Risk Score: 3 Electronic Signature(s) Signed: 12/11/2021 6:04:41 PM By: Karie Schwalbe RN Entered By: Karie Schwalbe on 12/11/2021 14:44:22

## 2021-12-11 NOTE — Progress Notes (Signed)
KADIENCE, MACCHI ([id number]) Visit Report for 12/11/2021 Allergy List Details Patient Name: Date of Service: Cynthia Landis Martins. 12/11/2021 2:00 PM Medical Record Number: [id number] Patient Account Number: [id number] Date of Birth/Sex: Treating RN: 09/21/1966 (56 y.o. Katrinka Blazing Primary Care Kalvyn Desa: Claybon Jabs Other Clinician: Referring Talibah Colasurdo: Treating Daralyn Bert/Extender: Pattricia Boss in Treatment: 0 Allergies Active Allergies No Known Allergies Allergy Notes Electronic Signature(s) Signed: 12/11/2021 6:04:41 PM By: Karie Schwalbe RN Entered By: Karie Schwalbe on 12/11/2021 14:21:39 -------------------------------------------------------------------------------- Arrival Information Details Patient Name: Date of Service: Cynthia Richardson, [name] 12/11/2021 2:00 PM Medical Record Number: [id number] Patient Account Number: [id number] Date of Birth/Sex: Treating RN: 03-13-1966 (56 y.o. Katrinka Blazing Primary Care Yurianna Tusing: Claybon Jabs Other Clinician: Referring Keyasia Jolliff: Treating Farrie Sann/Extender: Pattricia Boss in Treatment: 0 Visit Information Patient Arrived: Ambulatory Arrival Time: 14:14 Accompanied By: alone Transfer Assistance: None Patient Identification Verified: Yes Secondary Verification Process Completed: Yes Patient Requires Transmission-Based Precautions: No Patient Has Alerts: Yes Patient Alerts: R ABI  Electronic Signature(s) Signed: 12/11/2021 6:04:41 PM By: Karie Schwalbe RN Entered By: Karie Schwalbe on 12/11/2021 15:46:33 -------------------------------------------------------------------------------- Clinic Level of Care Assessment Details Patient Name: Date of Service: Cynthia Richardson, [name] 12/11/2021 2:00 PM Medical Record Number: [id number] Patient Account Number: [id number] Date of Birth/Sex: Treating RN: 1966/09/05 (56 y.o. Katrinka Blazing Primary Care Nettie Wyffels: Claybon Jabs Other  Clinician: Referring Raevyn Sokol: Treating Jabria Loos/Extender: Pattricia Boss in Treatment: 0 Clinic Level of Care Assessment Items TOOL 1 Quantity Score X- 1 0 Use when EandM and Procedure is performed on INITIAL visit ASSESSMENTS - Nursing Assessment / Reassessment X- 1 20 General Physical Exam (combine w/ comprehensive assessment (listed just below) when performed on new pt. evals) X- 1 25 Comprehensive Assessment (HX, ROS, Risk Assessments, Wounds Hx, etc.) ASSESSMENTS - Wound and Skin Assessment / Reassessment X- 1 10 Dermatologic / Skin Assessment (not related to wound area) ASSESSMENTS - Ostomy and/or Continence Assessment and Care  - 0 Incontinence Assessment and Management  - 0 Ostomy Care Assessment and Management (repouching, etc.) PROCESS - Coordination of Care X - Simple Patient / Family Education for ongoing care 1 15  - 0 Complex (extensive) Patient / Family Education for ongoing care X- 1 10 Staff obtains Chiropractor, Records, T Results / Process Orders est X- 1 10 Staff telephones HHA, Nursing Homes / Clarify orders / etc  - 0 Routine Transfer to another Facility (non-emergent condition)  - 0 Routine Hospital Admission (non-emergent condition) X- 1 15 New Admissions / Manufacturing engineer / Ordering NPWT Apligraf, etc. ,  - 0 Emergency Hospital Admission (emergent condition) PROCESS - Special Needs  - 0 Pediatric / Minor Patient Management  - 0 Isolation Patient Management  - 0 Hearing / Language / Visual special needs  - 0 Assessment of Community assistance (transportation, D/C planning, etc.)  - 0 Additional assistance / Altered mentation  - 0 Support Surface(s) Assessment (bed, cushion, seat, etc.) INTERVENTIONS - Miscellaneous  - 0 External ear exam  - 0 Patient Transfer (multiple staff / Nurse, adult / Similar devices)  - 0 Simple Staple / Suture removal (25 or less)  - 0 Complex Staple  / Suture removal (26 or more)  - 0 Hypo/Hyperglycemic Management (do not check if billed separately) X- 1 15 Ankle / Brachial Index (ABI) - do not check if billed separately Has the patient been seen at the hospital within the last three years: Yes Total Score: 120  Level Of Care: New/Established - Level 4 Electronic Signature(s) Signed: 12/11/2021 6:04:41 PM By: Karie Schwalbe RN Entered By: Karie Schwalbe on 12/11/2021 18:01:19 -------------------------------------------------------------------------------- Encounter Discharge Information Details Patient Name: Date of Service: Cynthia Richardson, [name] 12/11/2021 2:00 PM Medical Record Number: [id number] Patient Account Number: [id number] Date of Birth/Sex: Treating RN: 07/29/1966 (56 y.o. Katrinka Blazing Primary Care Cloa Bushong: Claybon Jabs Other Clinician: Referring Shanetha Bradham: Treating Li Bobo/Extender: Pattricia Boss in Treatment: 0 Encounter Discharge Information Items Post Procedure Vitals Discharge Condition: Stable Temperature (F): 98.5 Ambulatory Status: Ambulatory Pulse (bpm): 101 Discharge Destination: Home Respiratory Rate (breaths/min): 18 Transportation: Private Auto Blood Pressure (mmHg): 169/88 Accompanied By: friend Schedule Follow-up Appointment: Yes Clinical Summary of Care: Patient Declined Electronic Signature(s) Signed: 12/11/2021 6:04:41 PM By: Karie Schwalbe RN Entered By: Karie Schwalbe on 12/11/2021 18:03:43 -------------------------------------------------------------------------------- Lower Extremity Assessment Details Patient Name: Date of Service: Cynthia Richardson, [name] 12/11/2021 2:00 PM Medical Record Number: [id number] Patient Account Number: [id number] Date of Birth/Sex: Treating RN: March 29, 1966 (56 y.o. Katrinka Blazing Primary Care Emry Tobin: Claybon Jabs Other Clinician: Referring Aracelli Woloszyn: Treating Aerabella Galasso/Extender: Pattricia Boss in Treatment:  0 Edema Assessment Assessed: [Left: No] [Right: No] E[Left: dema] [Right: :] Calf Left: Right: Point of Measurement: 28 cm From Medial Instep 56 cm 56 cm Ankle Left: Right: Point of Measurement: 9 cm From Medial Instep 32 cm 31.3 cm Knee To Floor Left: Right: From Medial Instep 45 cm 45 cm Vascular Assessment Pulses: Dorsalis Pedis Palpable: [Left:Yes] [Right:Yes] Blood Pressure: Brachial: [Left:169] Ankle: [Left:Dorsalis Pedis: 188] Ankle Brachial Index: [Left:1.11] Electronic Signature(s) Signed: 12/11/2021 6:04:41 PM By: Karie Schwalbe RN Entered By: Karie Schwalbe on 12/11/2021 15:46:00 -------------------------------------------------------------------------------- Multi Wound Chart Details Patient Name: Date of Service: Cynthia Richardson, Cynthia Odea M. 12/11/2021 2:00 PM Medical Record Number: [id number] Patient Account Number: [id number] Date of Birth/Sex: Treating RN: 1966-01-20 (56 y.o. F) Primary Care Jerod Mcquain: Claybon Jabs Other Clinician: Referring Venola Castello: Treating Hallis Meditz/Extender: Pattricia Boss in Treatment: 0 Vital Signs Height(in): 64 Pulse(bpm): 101 Weight(lbs): 280 Blood Pressure(mmHg): 169/88 Body Mass Index(BMI): 48.1 Temperature(F): 98.5 Respiratory Rate(breaths/min): 18 Photos: [N/Cynthia:N/Cynthia] Proximal Abdomen - midline N/Cynthia N/Cynthia Wound Location: Gradually Appeared N/Cynthia N/Cynthia Wounding Event: Lesion N/Cynthia N/Cynthia Primary Etiology: Anemia, Congestive Heart Failure, N/Cynthia N/Cynthia Comorbid History: Osteoarthritis, Confinement Anxiety 10/19/2021 N/Cynthia N/Cynthia Date Acquired: 0 N/Cynthia N/Cynthia Weeks of Treatment: Open N/Cynthia N/Cynthia Wound Status: No N/Cynthia N/Cynthia Wound Recurrence: 0.5x0.6x0.1 N/Cynthia N/Cynthia Measurements L x W x D (cm) 0.236 N/Cynthia N/Cynthia Cynthia (cm) : rea 0.024 N/Cynthia N/Cynthia Volume (cm) : 0.00% N/Cynthia N/Cynthia % Reduction in Cynthia rea: 0.00% N/Cynthia N/Cynthia % Reduction in Volume: Full Thickness Without Exposed N/Cynthia N/Cynthia Classification: Support Structures Medium N/Cynthia N/Cynthia Exudate Cynthia  mount: Serosanguineous N/Cynthia N/Cynthia Exudate Type: red, brown N/Cynthia N/Cynthia Exudate Color: Medium (34-66%) N/Cynthia N/Cynthia Granulation Cynthia mount: Pink N/Cynthia N/Cynthia Granulation Quality: Medium (34-66%) N/Cynthia N/Cynthia Necrotic Cynthia mount: Fat Layer (Subcutaneous Tissue): Yes N/Cynthia N/Cynthia Exposed Structures: Fascia: No Tendon: No Muscle: No Joint: No Bone: No Small (1-33%) N/Cynthia N/Cynthia Epithelialization: Debridement - Excisional N/Cynthia N/Cynthia Debridement: Pre-procedure Verification/Time Out 16:05 N/Cynthia N/Cynthia Taken: Other N/Cynthia N/Cynthia Pain Control: Subcutaneous N/Cynthia N/Cynthia Tissue Debrided: Skin/Subcutaneous Tissue N/Cynthia N/Cynthia Level: 0.3 N/Cynthia N/Cynthia Debridement Cynthia (sq cm): rea Curette N/Cynthia N/Cynthia Instrument: Minimum N/Cynthia N/Cynthia Bleeding: Pressure N/Cynthia N/Cynthia Hemostasis Cynthia chieved: 0 N/Cynthia N/Cynthia Procedural Pain: 0 N/Cynthia N/Cynthia Post Procedural Pain: Procedure was tolerated well N/Cynthia N/Cynthia Debridement Treatment Response: 0.5x0.6x0.1 N/Cynthia N/Cynthia Post Debridement  Measurements L x W x D (cm) 0.024 N/Cynthia N/Cynthia Post Debridement Volume: (cm) Debridement N/Cynthia N/Cynthia Procedures Performed: Treatment Notes Electronic Signature(s) Signed: 12/11/2021 4:32:37 PM By: Duanne Guess MD, FACS Entered By: Duanne Guess on 12/11/2021 16:32:37 -------------------------------------------------------------------------------- Multi-Disciplinary Care Plan Details Patient Name: Date of Service: Cynthia Richardson, [name] 12/11/2021 2:00 PM Medical Record Number: [id number] Patient Account Number: [id number] Date of Birth/Sex: Treating RN: 09-14-1966 (56 y.o. Katrinka Blazing Primary Care Romar Woodrick: Claybon Jabs Other Clinician: Referring Melaine Mcphee: Treating Alyson Ki/Extender: Pattricia Boss in Treatment: 0 Active Inactive Wound/Skin Impairment Nursing Diagnoses: Knowledge deficit related to ulceration/compromised skin integrity Goals: Patient/caregiver will verbalize understanding of skin care regimen Date Initiated: 12/11/2021 Target Resolution Date: 01/08/2022 Goal  Status: Active Interventions: Assess ulceration(s) every visit Treatment Activities: Skin care regimen initiated : 12/11/2021 Notes: Electronic Signature(s) Signed: 12/11/2021 6:04:41 PM By: Karie Schwalbe RN Entered By: Karie Schwalbe on 12/11/2021 17:59:47 -------------------------------------------------------------------------------- Non-Wound Condition Assessment Details Patient Name: Date of Service: Cynthia Richardson, [name] 12/11/2021 2:00 PM Medical Record Number: [id number] Patient Account Number: [id number] Date of Birth/Sex: Treating RN: Apr 26, 1966 (56 y.o. Katrinka Blazing Primary Care Izaah Westman: Claybon Jabs Other Clinician: Referring Erinn Mendosa: Treating Samie Barclift/Extender: Pattricia Boss in Treatment: 0 Non-Wound Condition: Condition: Cellulitis Location: Leg Side: Right Notes [facility] Electronic Signature(s) Signed: 12/11/2021 6:04:41 PM By: Karie Schwalbe RN Entered By: Karie Schwalbe on 12/11/2021 16:09:59 -------------------------------------------------------------------------------- Non-Wound Condition Assessment Details Patient Name: Date of Service: Cynthia Richardson, [name] 12/11/2021 2:00 PM Medical Record Number: [id number] Patient Account Number: [id number] Date of Birth/Sex: Treating RN: [birth year] (56 y.o. Katrinka Blazing Primary Care Lavilla Delamora: Claybon Jabs Other Clinician: Referring Hagar Sadiq: Treating Kunaal Walkins/Extender: Pattricia Boss in Treatment: 0 Non-Wound Condition: Condition: Cellulitis Location: Leg Side: Left Notes [facility] Electronic Signature(s) Signed: 12/11/2021 6:04:41 PM By: Karie Schwalbe RN Entered By: Karie Schwalbe on 12/11/2021 16:10:20 -------------------------------------------------------------------------------- Pain Assessment Details Patient Name: Date of Service: Cynthia Richardson, [name] 12/11/2021 2:00 PM Medical Record Number: [id number] Patient Account Number: [id number] Date of  Birth/Sex: Treating RN: 1966/04/22 (56 y.o. Katrinka Blazing Primary Care Estill Llerena: Claybon Jabs Other Clinician: Referring Shaunice Levitan: Treating Bauer Ausborn/Extender: Pattricia Boss in Treatment: 0 Active Problems Location of Pain Severity and Description of Pain Patient Has Paino Yes Site Locations Pain Location: Pain Location: Generalized Pain With Dressing Change: No Duration of the Pain. Constant / Intermittento Constant Rate the pain. Current Pain Level: 7 Worst Pain Level: 10 Least Pain Level: 7 Tolerable Pain Level: 10 Character of Pain Describe the Pain: Shooting, Throbbing Pain Management and Medication Current Pain Management: Medication: Yes Cold Application: No Rest: Yes Massage: No Activity: No T.E.N.S.: No Heat Application: No Leg drop or elevation: No Is the Current Pain Management Adequate: Inadequate How does your wound impact your activities of daily livingo Sleep: No Bathing: No Appetite: No Relationship With Others: No Bladder Continence: No Emotions: No Bowel Continence: No Work: No Toileting: No Drive: No Dressing: No Hobbies: No Electronic Signature(s) Signed: 12/11/2021 6:04:41 PM By: Karie Schwalbe RN Entered By: Karie Schwalbe on 12/11/2021 15:04:26 -------------------------------------------------------------------------------- Patient/Caregiver Education Details Patient Name: Date of Service: Cynthia Richardson 2/20/2023andnbsp2:00 PM Medical Record Number: [id number] Patient Account Number: [id number] Date of Birth/Gender: Treating RN: January 09, 1966 (56 y.o. Katrinka Blazing Primary Care Physician: Claybon Jabs Other Clinician: Referring Physician: Treating Physician/Extender: Pattricia Boss in Treatment: 0 Education Assessment Education Provided To: Patient Education Topics Provided Venous: Methods: Explain/Verbal Responses: Return  demonstration correctly Electronic  Signature(s) Signed: 12/11/2021 6:04:41 PM By: Karie Schwalbe RN Entered By: Karie Schwalbe on 12/11/2021 18:00:01 -------------------------------------------------------------------------------- Wound Assessment Details Patient Name: Date of Service: Cynthia Richardson, [name] 12/11/2021 2:00 PM Medical Record Number: [id number] Patient Account Number: [id number] Date of Birth/Sex: Treating RN: 1966-09-21 (56 y.o. Katrinka Blazing Primary Care Kirke Breach: Claybon Jabs Other Clinician: Referring Ashford Clouse: Treating Lakaya Tolen/Extender: Pattricia Boss in Treatment: 0 Wound Status Wound Number: 1 Primary Lesion Etiology: Wound Location: Proximal Abdomen - midline Wound Status: Open Wounding Event: Gradually Appeared Comorbid Anemia, Congestive Heart Failure, Osteoarthritis, Date Acquired: 10/19/2021 History: Confinement Anxiety Weeks Of Treatment: 0 Clustered Wound: No Photos Wound Measurements Length: (cm) 0.5 Width: (cm) 0.6 Depth: (cm) 0.1 Area: (cm) 0.236 Volume: (cm) 0.024 % Reduction in Area: 0% % Reduction in Volume: 0% Epithelialization: Small (1-33%) Tunneling: No Undermining: No Wound Description Classification: Full Thickness Without Exposed Support Structures Exudate Amount: Medium Exudate Type: Serosanguineous Exudate Color: red, brown Foul Odor After Cleansing: No Slough/Fibrino Yes Wound Bed Granulation Amount: Medium (34-66%) Exposed Structure Granulation Quality: Pink Fascia Exposed: No Necrotic Amount: Medium (34-66%) Fat Layer (Subcutaneous Tissue) Exposed: Yes Necrotic Quality: Adherent Slough Tendon Exposed: No Muscle Exposed: No Joint Exposed: No Bone Exposed: No Treatment Notes Wound #1 (Abdomen - midline) Wound Laterality: Proximal Cleanser Soap and Water Discharge Instruction: May shower and wash wound with dial antibacterial soap and water prior to dressing change. Peri-Wound Care Topical Primary  Dressing Bandaid Discharge Instruction: apply over abdomen wound Secondary Dressing Secured With Compression Wrap Compression Stockings Add-Ons Electronic Signature(s) Signed: 12/11/2021 6:04:41 PM By: Karie Schwalbe RN Entered By: Karie Schwalbe on 12/11/2021 15:08:19 -------------------------------------------------------------------------------- Vitals Details Patient Name: Date of Service: Cynthia Richardson, [name] 12/11/2021 2:00 PM Medical Record Number: [id number] Patient Account Number: [id number] Date of Birth/Sex: Treating RN: June 20, 1966 (56 y.o. Katrinka Blazing Primary Care Morell Mears: Claybon Jabs Other Clinician: Referring Nikyla Navedo: Treating Ralf Konopka/Extender: Pattricia Boss in Treatment: 0 Vital Signs Time Taken: 14:17 Temperature (F): 98.5 Height (in): 64 Pulse (bpm): 101 Source: Stated Respiratory Rate (breaths/min): 18 Weight (lbs): 280 Blood Pressure (mmHg): 169/88 Source: Stated Reference Range: 80 - 120 mg / dl Body Mass Index (BMI): 48.1 Electronic Signature(s) Signed: 12/11/2021 6:04:41 PM By: Karie Schwalbe RN Entered By: Karie Schwalbe on 12/11/2021 14:21:23

## 2021-12-12 NOTE — Progress Notes (Signed)
CHERYLN, BALCOM ([id number]) Visit Report for 12/11/2021 Chief Complaint Document Details Patient Name: Date of Service: Cynthia Richardson. 12/11/2021 2:00 PM Medical Record Number: [id number] Patient Account Number: [id number] Date of Birth/Sex: Treating RN: 11-22-1965 (56 y.o. F) Primary Care Provider: Claybon Jabs Other Clinician: Referring Provider: Treating Provider/Extender: Pattricia Boss in Treatment: 0 Information Obtained from: Patient Chief Complaint 12/11/21: Patient presents to the wound care center due with non-wound condition(s), specifically bilateral lower extremity cellulitis associated with marked lymphedema. She also has innumerable abrasions secondary to scratching her skin. 1 of these is open and located on her abdomen, just cranial to the umbilicus. Electronic Signature(s) Signed: 12/11/2021 4:34:24 PM By: Duanne Guess MD, FACS Entered By: Duanne Guess on 12/11/2021 16:34:24 -------------------------------------------------------------------------------- Debridement Details Patient Name: Date of Service: Cynthia Richardson, [name] 12/11/2021 2:00 PM Medical Record Number: [id number] Patient Account Number: [id number] Date of Birth/Sex: Treating RN: [birth year] (56 y.o. Cynthia Richardson Primary Care Provider: Claybon Jabs Other Clinician: Referring Provider: Treating Provider/Extender: Pattricia Boss in Treatment: 0 Debridement Performed for Assessment: Wound #1 Proximal Abdomen - midline Performed By: Physician Duanne Guess, MD Debridement Type: Debridement Level of Consciousness (Pre-procedure): Awake and Alert Pre-procedure Verification/Time Out Yes - 16:05 Taken: Start Time: 16:05 Pain Control: Other : Benzocaine 20% T Area Debrided (L x W): otal 0.5 (cm) x 0.6 (cm) = 0.3 (cm) Tissue and other material debrided: Viable, Non-Viable, Subcutaneous Level: Skin/Subcutaneous Tissue Debridement Description:  Excisional Instrument: Curette Bleeding: Minimum Hemostasis Achieved: Pressure End Time: 16:06 Procedural Pain: 0 Post Procedural Pain: 0 Response to Treatment: Procedure was tolerated well Level of Consciousness (Post- Awake and Alert procedure): Post Debridement Measurements of Total Wound Length: (cm) 0.5 Width: (cm) 0.6 Depth: (cm) 0.1 Volume: (cm) 0.024 Character of Wound/Ulcer Post Debridement: Improved Post Procedure Diagnosis Same as Pre-procedure Electronic Signature(s) Signed: 12/11/2021 6:04:41 PM By: Karie Schwalbe RN Signed: 12/12/2021 7:49:52 AM By: Duanne Guess MD, FACS Entered By: Karie Schwalbe on 12/11/2021 16:12:39 -------------------------------------------------------------------------------- HPI Details Patient Name: Date of Service: Cynthia Richardson, [name] 12/11/2021 2:00 PM Medical Record Number: [id number] Patient Account Number: [id number] Date of Birth/Sex: Treating RN: 02-Dec-1965 (56 y.o. F) Primary Care Provider: Claybon Jabs Other Clinician: Referring Provider: Treating Provider/Extender: Pattricia Boss in Treatment: 0 History of Present Illness HPI Description: 12/11/2021: This patient presents today for further evaluation and management of bilateral lower extremity lymphedema with associated cellulitis. She has Cynthia past medical history that is significant for congestive heart failure, anemia, and morbid obesity. She was treated with 2 separate courses of outpatient antibiotics for lower extremity cellulitis, including cephalexin and ciprofloxacin. When she failed to respond, she was admitted to the hospital in [facility] where she was treated with intravenous antibiotics. According to the electronic medical record, which I have reviewed, she received Cynthia course of IV clindamycin followed by IV vancomycin. She was also placed in compression and upon discharge from the hospital, the erythema and warmth in her legs had markedly improved,  as had the swelling. She was apparently referred to the lymphedema clinic, but she reports she has not yet received an appointment. The erythema and warmth in her legs returned fairly rapidly and she was started on Cynthia course of doxycycline, starting Monday, 04 December 2021. She was prescribed Cynthia 10-day course. She feels like there may be slight improvement in the redness. She continues to have significant swelling, however. She is wearing compression stockings at home, per her  report. She thinks these are likely 30 to 40 mmHg compression. She also reports that there is an open wound just above her umbilicus. She says that her skin itches and when she scratches, wounds opened up. She denies any fevers or chills. No nausea or vomiting. No significant drainage or exudate from any of the lesions. No drainage from her legs, but she does say that sometimes, in the past, they have seeped Cynthia clear fluid. Electronic Signature(s) Signed: 12/11/2021 4:40:44 PM By: Duanne Guess MD, FACS Entered By: Duanne Guess on 12/11/2021 16:40:44 -------------------------------------------------------------------------------- Physical Exam Details Patient Name: Date of Service: Cynthia Richardson, [name] 12/11/2021 2:00 PM Medical Record Number: [id number] Patient Account Number: [id number] Date of Birth/Sex: Treating RN: 12/29/1965 (56 y.o. F) Primary Care Provider: Claybon Jabs Other Clinician: Referring Provider: Treating Provider/Extender: Pattricia Boss in Treatment: 0 Constitutional Morbid obesity. Respiratory Normal work of breathing on room air.. Cardiovascular Pedal pulses are nonpalpable bilaterally. ABIs were able to be obtained, however, in the left leg only. This was normal at 1.11. The right was noncompressible.. Integumentary (Hair, Skin) There are innumerable small abrasions and scabs over all exposed skin surfaces including her legs, abdomen, arms. The skin of her lower extremities  is markedly erythematous and warm. No open wounds on her legs. The majority of the small abrasions are scabbed over, but there is one that is open just above her umbilicus.. Electronic Signature(s) Signed: 12/11/2021 4:42:54 PM By: Duanne Guess MD, FACS Entered By: Duanne Guess on 12/11/2021 16:42:54 -------------------------------------------------------------------------------- Physician Orders Details Patient Name: Date of Service: Cynthia Richardson, [name] 12/11/2021 2:00 PM Medical Record Number: [id number] Patient Account Number: [id number] Date of Birth/Sex: Treating RN: 1965/12/24 (56 y.o. Cynthia Richardson Primary Care Provider: Claybon Jabs Other Clinician: Referring Provider: Treating Provider/Extender: Pattricia Boss in Treatment: 0 Verbal / Phone Orders: No Diagnosis Coding ICD-10 Coding Code Description I89.0 Lymphedema, not elsewhere classified L03.115 Cellulitis of right lower limb L03.116 Cellulitis of left lower limb S30.811A Abrasion of abdominal wall, initial encounter Follow-up Appointments ppointment in: - *** Return Friday**** Dr Lady Gary Return Cynthia Bathing/ Shower/ Hygiene May shower and wash wound with soap and water. Edema Control - Lymphedema / SCD / Other Avoid standing for long periods of time. Compression stocking or Garment 30-40 mm/Hg pressure to: - Apply stockings after redness in legs is gone Other Edema Control Orders/Instructions: - Please find out when your appointment is at Lymphedema Clinic Non Wound Condition Cleanse area with: - soap and water ** Try not to scratch Wound Treatment Wound #1 - Abdomen - midline Wound Laterality: Proximal Cleanser: Soap and Water Discharge Instructions: May shower and wash wound with dial antibacterial soap and water prior to dressing change. Prim Dressing: Bandaid ary Discharge Instructions: apply over abdomen wound Patient Medications llergies: No Known Allergies Cynthia Notifications  Medication Indication Start End Augmentin DOSE 0 - oral 875 mg-125 mg tablet - 1 tablet oral BID x 10 days Notes Please pick up Prescription from [employer]) Signed: 12/11/2021 4:47:02 PM By: Duanne Guess MD, FACS Entered By: Duanne Guess on 12/11/2021 16:47:02 -------------------------------------------------------------------------------- Problem List Details Patient Name: Date of Service: Cynthia Richardson, [name] 12/11/2021 2:00 PM Medical Record Number: [id number] Patient Account Number: [id number] Date of Birth/Sex: Treating RN: 1966-06-18 (56 y.o. F) Primary Care Provider: Claybon Jabs Other Clinician: Referring Provider: Treating Provider/Extender: Pattricia Boss in Treatment: 0 Active Problems ICD-10 Encounter Code Description Active Date MDM Diagnosis I89.0  Lymphedema, not elsewhere classified 12/11/2021 No Yes L03.115 Cellulitis of right lower limb 12/11/2021 No Yes L03.116 Cellulitis of left lower limb 12/11/2021 No Yes S30.811A Abrasion of abdominal wall, initial encounter 12/11/2021 No Yes Inactive Problems Resolved Problems Electronic Signature(s) Signed: 12/11/2021 4:32:11 PM By: Duanne Guess MD, FACS Entered By: Duanne Guess on 12/11/2021 16:32:10 -------------------------------------------------------------------------------- Progress Note Details Patient Name: Date of Service: Cynthia Richardson, [name] 12/11/2021 2:00 PM Medical Record Number: [id number] Patient Account Number: [id number] Date of Birth/Sex: Treating RN: 29-Sep-1966 (56 y.o. F) Primary Care Provider: Claybon Jabs Other Clinician: Referring Provider: Treating Provider/Extender: Pattricia Boss in Treatment: 0 Subjective Chief Complaint Information obtained from Patient 12/11/21: Patient presents to the wound care center due with non-wound condition(s), specifically bilateral lower extremity cellulitis associated with  marked lymphedema. She also has innumerable abrasions secondary to scratching her skin. 1 of these is open and located on her abdomen, just cranial to the umbilicus. History of Present Illness (HPI) 12/11/2021: This patient presents today for further evaluation and management of bilateral lower extremity lymphedema with associated cellulitis. She has Cynthia past medical history that is significant for congestive heart failure, anemia, and morbid obesity. She was treated with 2 separate courses of outpatient antibiotics for lower extremity cellulitis, including cephalexin and ciprofloxacin. When she failed to respond, she was admitted to the hospital in [facility] where she was treated with intravenous antibiotics. According to the electronic medical record, which I have reviewed, she received Cynthia course of IV clindamycin followed by IV vancomycin. She was also placed in compression and upon discharge from the hospital, the erythema and warmth in her legs had markedly improved, as had the swelling. She was apparently referred to the lymphedema clinic, but she reports she has not yet received an appointment. The erythema and warmth in her legs returned fairly rapidly and she was started on Cynthia course of doxycycline, starting Monday, 04 December 2021. She was prescribed Cynthia 10- day course. She feels like there may be slight improvement in the redness. She continues to have significant swelling, however. She is wearing compression stockings at home, per her report. She thinks these are likely 30 to 40 mmHg compression. She also reports that there is an open wound just above her umbilicus. She says that her skin itches and when she scratches, wounds opened up. She denies any fevers or chills. No nausea or vomiting. No significant drainage or exudate from any of the lesions. No drainage from her legs, but she does say that sometimes, in the past, they have seeped Cynthia clear fluid. Patient History Information obtained  from Patient. Allergies No Known Allergies Family History Unknown History. Social History Never smoker, Marital Status - Divorced, Alcohol Use - Rarely - wine, Drug Use - No History, Caffeine Use - Daily - soda;coffee. Medical History Hematologic/Lymphatic Patient has history of Anemia - was in hospital Nov 04 2021 -pt. stated she got Iron in hospital Cardiovascular Patient has history of Congestive Heart Failure - in 2015. Takes Lasix daily Musculoskeletal Patient has history of Osteoarthritis Psychiatric Patient has history of Confinement Anxiety Denies history of Anorexia/bulimia Hospitalization/Surgery History - R+L knee arthoplasty;Right hip replacement;Hysterectomy;Appendectomy; Back surgery. Medical Cynthia Surgical History Notes nd Eyes wears eye glasses Ear/Nose/Mouth/Throat Pt. stated has an ulcer on the tongue and needs dental work Respiratory Pt. states that she needs to get tested for sleep apnea Cardiovascular Had Cynthia myocardial infarction in 2015-as per pt. Musculoskeletal Hx: R +L knee arthroplasty. Right hip replacement Neurologic  Pt. stated that she had Cynthia seizure which resulted in " hypertensive encephalopathy"- 2013 Psychiatric Pt. stated she has bipolar disorder;PTSD;Schizophrenia Review of Systems (ROS) Constitutional Symptoms (General Health) Denies complaints or symptoms of Fatigue, Fever, Chills, Marked Weight Change. Gastrointestinal Denies complaints or symptoms of Frequent diarrhea, Nausea, Vomiting. Endocrine Denies complaints or symptoms of Heat/cold intolerance. Genitourinary Denies complaints or symptoms of Frequent urination. Integumentary (Skin) Cellulitis is bilateral legso Psychiatric Denies complaints or symptoms of Claustrophobia, Suicidal. Objective Constitutional Morbid obesity. Vitals Time Taken: 2:17 PM, Height: 64 in, Source: Stated, Weight: 280 lbs, Source: Stated, BMI: 48.1, Temperature: 98.5 F, Pulse: 101 bpm, Respiratory Rate:  18 breaths/min, Blood Pressure: 169/88 mmHg. Respiratory Normal work of breathing on room air.. Cardiovascular Pedal pulses are nonpalpable bilaterally. ABIs were able to be obtained, however, in the left leg only. This was normal at 1.11. The right was noncompressible.. Integumentary (Hair, Skin) There are innumerable small abrasions and scabs over all exposed skin surfaces including her legs, abdomen, arms. The skin of her lower extremities is markedly erythematous and warm. No open wounds on her legs. The majority of the small abrasions are scabbed over, but there is one that is open just above her umbilicus.. Wound #1 status is Open. Original cause of wound was Gradually Appeared. The date acquired was: 10/19/2021. The wound is located on the Proximal Abdomen - midline. The wound measures 0.5cm length x 0.6cm width x 0.1cm depth; 0.236cm^2 area and 0.024cm^3 volume. There is Fat Layer (Subcutaneous Tissue) exposed. There is no tunneling or undermining noted. There is Cynthia medium amount of serosanguineous drainage noted. There is medium (34-66%) pink granulation within the wound bed. There is Cynthia medium (34-66%) amount of necrotic tissue within the wound bed including Adherent Slough. Assessment Active Problems ICD-10 Lymphedema, not elsewhere classified Cellulitis of right lower limb Cellulitis of left lower limb Abrasion of abdominal wall, initial encounter Procedures Wound #1 Pre-procedure diagnosis of Wound #1 is Cynthia Lesion located on the Proximal Abdomen - midline . There was Cynthia Excisional Skin/Subcutaneous Tissue Debridement with Cynthia total area of 0.3 sq cm performed by Duanne Guess, MD. With the following instrument(s): Curette to remove Viable and Non-Viable tissue/material. Material removed includes Subcutaneous Tissue after achieving pain control using Other (Benzocaine 20%). No specimens were taken. Cynthia time out was conducted at 16:05, prior to the start of the procedure. Cynthia Minimum  amount of bleeding was controlled with Pressure. The procedure was tolerated well with Cynthia pain level of 0 throughout and Cynthia pain level of 0 following the procedure. Post Debridement Measurements: 0.5cm length x 0.6cm width x 0.1cm depth; 0.024cm^3 volume. Character of Wound/Ulcer Post Debridement is improved. Post procedure Diagnosis Wound #1: Same as Pre-Procedure Plan Follow-up Appointments: Return Appointment in: - *** Return Friday**** Dr Shelly Flatten Shower/ Hygiene: May shower and wash wound with soap and water. Edema Control - Lymphedema / SCD / Other: Avoid standing for long periods of time. Compression stocking or Garment 30-40 mm/Hg pressure to: - Apply stockings after redness in legs is gone Other Edema Control Orders/Instructions: - Please find out when your appointment is at Lymphedema Clinic Non Wound Condition: Cleanse area with: - soap and water ** Try not to scratch The following medication(s) was prescribed: Augmentin oral 875 mg-125 mg tablet 0 1 tablet oral BID x 10 days General Notes: Please pick up Prescription from [facility] WOUND #1: - Abdomen - midline Wound Laterality: Proximal Cleanser: Soap and Water Discharge Instructions: May shower and wash wound with dial  antibacterial soap and water prior to dressing change. Prim Dressing: Bandaid ary Discharge Instructions: apply over abdomen wound 12/11/2021: The patient presents with cellulitis and significant bilateral lower extremity lymphedema. She has chronic systolic CHF, as well as morbid obesity. She denies being diabetic. She has previously been hospitalized for IV treatment with fairly rapid recurrence after discharge from the hospital. She has no open wounds on her legs, but the erythema and warmth are concerning. She is currently taking Cynthia course of oral doxycycline, and has about 3 days left on this treatment. I am concerned that she is not receiving adequate coverage at this time although she has  been on multiple prior courses of antibiotics including ciprofloxacin, cephalexin, and now doxycycline, as well as IV clindamycin and vancomycin during her hospital stay. I am reluctant to place her in wraps with active cellulitis, but I think in the future she will benefit greatly from compression. The wound on her abdomen is small and was debrided of slough today. I think this will scab and heal, similar to her other wounds that she has had. I have prescribed 10 days of Augmentin and will see her on Friday to assess for improvement. Electronic Signature(s) Signed: 12/11/2021 4:50:56 PM By: Duanne Guess MD, FACS Entered By: Duanne Guess on 12/11/2021 16:50:56 -------------------------------------------------------------------------------- HxROS Details Patient Name: Date of Service: Cynthia Richardson, [name] 12/11/2021 2:00 PM Medical Record Number: [id number] Patient Account Number: [id number] Date of Birth/Sex: Treating RN: June 06, 1966 (56 y.o. Cynthia Richardson Primary Care Provider: Claybon Jabs Other Clinician: Referring Provider: Treating Provider/Extender: Pattricia Boss in Treatment: 0 Information Obtained From Patient Constitutional Symptoms (General Health) Complaints and Symptoms: Negative for: Fatigue; Fever; Chills; Marked Weight Change Gastrointestinal Complaints and Symptoms: Negative for: Frequent diarrhea; Nausea; Vomiting Endocrine Complaints and Symptoms: Negative for: Heat/cold intolerance Genitourinary Complaints and Symptoms: Negative for: Frequent urination Psychiatric Complaints and Symptoms: Negative for: Claustrophobia; Suicidal Medical History: Positive for: Confinement Anxiety Negative for: Anorexia/bulimia Past Medical History Notes: Pt. stated she has bipolar disorder;PTSD;Schizophrenia Eyes Medical History: Past Medical History Notes: wears eye glasses Ear/Nose/Mouth/Throat Medical History: Past Medical History  Notes: Pt. stated has an ulcer on the tongue and needs dental work Hematologic/Lymphatic Medical History: Positive for: Anemia - was in hospital Nov 04 2021 -pt. stated she got Iron in hospital Respiratory Medical History: Past Medical History Notes: Pt. states that she needs to get tested for sleep apnea Cardiovascular Medical History: Positive for: Congestive Heart Failure - in 2015. Takes Lasix daily Past Medical History Notes: Had Cynthia myocardial infarction in 2015-as per pt. Immunological Integumentary (Skin) Complaints and Symptoms: Review of System Notes: Cellulitis is bilateral legso Musculoskeletal Medical History: Positive for: Osteoarthritis Past Medical History Notes: Hx: R +L knee arthroplasty. Right hip replacement Neurologic Medical History: Past Medical History Notes: Pt. stated that she had Cynthia seizure which resulted in " hypertensive encephalopathy"- 2013 Oncologic Immunizations Pneumococcal Vaccine: Received Pneumococcal Vaccination: Yes Received Pneumococcal Vaccination On or After 60th Birthday: No Implantable Devices None Hospitalization / Surgery History Type of Hospitalization/Surgery R+L knee arthoplasty;Right hip replacement;Hysterectomy;Appendectomy; Back surgery Family and Social History Unknown History: Yes; Never smoker; Marital Status - Divorced; Alcohol Use: Rarely - wine; Drug Use: No History; Caffeine Use: Daily - soda;coffee; Financial Concerns: No; Food, Clothing or Shelter Needs: No; Support System Lacking: No; Transportation Concerns: Yes - Pt. doesn't drive Electronic Signature(s) Signed: 12/11/2021 6:04:41 PM By: Karie Schwalbe RN Signed: 12/12/2021 7:49:52 AM By: Duanne Guess MD, FACS Entered By: Karie Schwalbe on  12/11/2021 14:38:21 -------------------------------------------------------------------------------- SuperBill Details Patient Name: Date of Service: Cynthia Richardson 12/11/2021 Medical Record Number:  [id number] Patient Account Number: [id number] Date of Birth/Sex: Treating RN: 1966-08-29 (56 y.o. F) Primary Care Provider: Claybon Jabs Other Clinician: Referring Provider: Treating Provider/Extender: Pattricia Boss in Treatment: 0 Diagnosis Coding ICD-10 Codes Code Description I89.0 Lymphedema, not elsewhere classified L03.115 Cellulitis of right lower limb L03.116 Cellulitis of left lower limb S30.811A Abrasion of abdominal wall, initial encounter Facility Procedures CPT4 Code: [id number] Description: 99214 - WOUND CARE VISIT-LEV 4 EST PT Modifier: Quantity: 1 CPT4 Code: [id number] Description: 11042 - DEB SUBQ TISSUE 20 SQ CM/< ICD-10 Diagnosis Description S30.811A Abrasion of abdominal wall, initial encounter Modifier: Quantity: 1 Physician Procedures : CPT4 Code Description Modifier [id number] 99204 - WC PHYS LEVEL 4 - NEW PT 25 ICD-10 Diagnosis Description S30.811A Abrasion of abdominal wall, initial encounter Quantity: 1 : [id number] 11042 - WC PHYS SUBQ TISS 20 SQ CM ICD-10 Diagnosis Description S30.811A Abrasion of abdominal wall, initial encounter Quantity: 1 Electronic Signature(s) Signed: 12/11/2021 6:04:41 PM By: Karie Schwalbe RN Signed: 12/12/2021 7:49:52 AM By: Duanne Guess MD, FACS Previous Signature: 12/11/2021 4:51:28 PM Version By: Duanne Guess MD, FACS Entered By: Karie Schwalbe on 12/11/2021 18:02:16

## 2021-12-15 ENCOUNTER — Encounter (HOSPITAL_BASED_OUTPATIENT_CLINIC_OR_DEPARTMENT_OTHER): Payer: Medicare Other | Admitting: General Surgery

## 2021-12-15 ENCOUNTER — Other Ambulatory Visit: Payer: Self-pay

## 2021-12-15 DIAGNOSIS — S30811A Abrasion of abdominal wall, initial encounter: Secondary | ICD-10-CM | POA: Diagnosis not present

## 2021-12-15 NOTE — Progress Notes (Signed)
AYESHIA, SCHICKER ([id number]) Visit Report for 12/15/2021 Arrival Information Details Patient Name: Date of Service: Cynthia Richardson. 12/15/2021 9:00 Cynthia M Medical Record Number: [id number] Patient Account Number: [id number] Date of Birth/Sex: Treating RN: 01/08/1966 (56 y.o. Cynthia Richardson Primary Care Davan Richardson: Claybon Jabs Other Clinician: Referring Magie Ciampa: Treating Seward Coran/Extender: Pattricia Boss in Treatment: 0 Visit Information History Since Last Visit Added or deleted any medications: No Patient Arrived: Ambulatory Any new allergies or adverse reactions: No Arrival Time: 08:53 Had Cynthia fall or experienced change in No Accompanied By: self activities of daily living that may affect Transfer Assistance: None risk of falls: Patient Identification Verified: Yes Signs or symptoms of abuse/neglect since last visito No Secondary Verification Process Completed: Yes Hospitalized since last visit: No Patient Requires Transmission-Based Precautions: No Implantable device outside of the clinic excluding No Patient Has Alerts: Yes cellular tissue based products placed in the center Patient Alerts: R ABI Barton since last visit: Has Dressing in Place as Prescribed: Yes Pain Present Now: Yes Electronic Signature(s) Signed: 12/15/2021 2:25:17 PM By: Zenaida Deed RN, BSN Entered By: Zenaida Deed on 12/15/2021 08:56:35 -------------------------------------------------------------------------------- Clinic Level of Care Assessment Details Patient Name: Date of Service: Cynthia Richardson, [name] 12/15/2021 9:00 Cynthia M Medical Record Number: [id number] Patient Account Number: [id number] Date of Birth/Sex: Treating RN: [birth year] (56 y.o. Cynthia Richardson Primary Care Ronnisha Felber: Claybon Jabs Other Clinician: Referring Orva Riles: Treating Drue Camera/Extender: Pattricia Boss in Treatment: 0 Clinic Level of Care Assessment Items TOOL 4 Quantity Score []  -  0 Use when only an EandM is performed on FOLLOW-UP visit ASSESSMENTS - Nursing Assessment / Reassessment X- 1 10 Reassessment of Co-morbidities (includes updates in patient status) X- 1 5 Reassessment of Adherence to Treatment Plan ASSESSMENTS - Wound and Skin Cynthia ssessment / Reassessment X - Simple Wound Assessment / Reassessment - one wound 1 5 []  - 0 Complex Wound Assessment / Reassessment - multiple wounds X- 1 10 Dermatologic / Skin Assessment (not related to wound area) ASSESSMENTS - Focused Assessment []  - 0 Circumferential Edema Measurements - multi extremities []  - 0 Nutritional Assessment / Counseling / Intervention []  - 0 Lower Extremity Assessment (monofilament, tuning fork, pulses) []  - 0 Peripheral Arterial Disease Assessment (using hand held doppler) ASSESSMENTS - Ostomy and/or Continence Assessment and Care []  - 0 Incontinence Assessment and Management []  - 0 Ostomy Care Assessment and Management (repouching, etc.) PROCESS - Coordination of Care X - Simple Patient / Family Education for ongoing care 1 15 []  - 0 Complex (extensive) Patient / Family Education for ongoing care X- 1 10 Staff obtains Chiropractor, Records, T Results / Process Orders est []  - 0 Staff telephones HHA, Nursing Homes / Clarify orders / etc []  - 0 Routine Transfer to another Facility (non-emergent condition) []  - 0 Routine Hospital Admission (non-emergent condition) []  - 0 New Admissions / Manufacturing engineer / Ordering NPWT Apligraf, etc. , []  - 0 Emergency Hospital Admission (emergent condition) X- 1 10 Simple Discharge Coordination []  - 0 Complex (extensive) Discharge Coordination PROCESS - Special Needs []  - 0 Pediatric / Minor Patient Management []  - 0 Isolation Patient Management []  - 0 Hearing / Language / Visual special needs []  - 0 Assessment of Community assistance (transportation, D/C planning, etc.) []  - 0 Additional assistance / Altered mentation []  -  0 Support Surface(s) Assessment (bed, cushion, seat, etc.) INTERVENTIONS - Wound Cleansing / Measurement X - Simple Wound Cleansing - one wound 1 5 []  -  0 Complex Wound Cleansing - multiple wounds X- 1 5 Wound Imaging (photographs - any number of wounds) []  - 0 Wound Tracing (instead of photographs) X- 1 5 Simple Wound Measurement - one wound []  - 0 Complex Wound Measurement - multiple wounds INTERVENTIONS - Wound Dressings X - Small Wound Dressing one or multiple wounds 1 10 []  - 0 Medium Wound Dressing one or multiple wounds []  - 0 Large Wound Dressing one or multiple wounds []  - 0 Application of Medications - topical []  - 0 Application of Medications - injection INTERVENTIONS - Miscellaneous []  - 0 External ear exam []  - 0 Specimen Collection (cultures, biopsies, blood, body fluids, etc.) []  - 0 Specimen(s) / Culture(s) sent or taken to Lab for analysis []  - 0 Patient Transfer (multiple staff / / Similar devices) []  - 0 Simple Staple / Suture removal (25 or less) []  - 0 Complex Staple / Suture removal (26 or more) []  - 0 Hypo / Hyperglycemic Management (close monitor of Blood Glucose) []  - 0 Ankle / Brachial Index (ABI) - do not check if billed separately X- 1 5 Vital Signs Has the patient been seen at the hospital within the last three years: Yes Total Score: 95 Level Of Care: New/Established - Level 3 Electronic Signature(s) Signed: 12/15/2021 2:25:17 PM By: RN, BSN Entered By: on 12/15/2021 09:17:18 -------------------------------------------------------------------------------- Encounter Discharge Information Details Patient Name: Date of Service: Cynthia Richardson, [name] 12/15/2021 9:00 Cynthia M Medical Record Number: Patient Account Number: Date of Birth/Sex: Treating RN: 05-17-1966 (56 y.o. Nurse, adult Primary Care Anijah Spohr: Other Clinician: Referring Xenia Nile: Treating  Merrick Feutz/Extender: in Treatment: 0 Encounter Discharge Information Items Discharge Condition: Stable Ambulatory Status: Ambulatory Discharge Destination: Home Transportation: Private Auto Accompanied By: self Schedule Follow-up Appointment: Yes Clinical Summary of Care: Patient Declined Electronic Signature(s) Signed: 12/15/2021 2:25:17 PM By: RN, BSN Entered By: 12/17/2021 on 12/15/2021 09:22:13 -------------------------------------------------------------------------------- Lower Extremity Assessment Details Patient Name: Date of Service: Cynthia Richardson, [name] 12/15/2021 9:00 Cynthia M Medical Record Number: 12/17/2021 Patient Account Number: 12/17/2021 Date of Birth/Sex: Treating RN: [birth year] (56 y.o. 08/22/1966 Primary Care Savio Albrecht: 56 Other Clinician: Referring Jacquelynn Friend: Treating Shonia Skilling/Extender: Cynthia Richardson in Treatment: 0 Electronic Signature(s) Signed: 12/15/2021 2:25:17 PM By: Pattricia Boss RN, BSN Entered By: 12/17/2021 on 12/15/2021 09:00:22 -------------------------------------------------------------------------------- Multi Wound Chart Details Patient Name: Date of Service: Cynthia Richardson, [name] 12/15/2021 9:00 Cynthia M Medical Record Number: 12/17/2021 Patient Account Number: 12/17/2021 Date of Birth/Sex: Treating RN: 05-24-1966 (56 y.o. F) Primary Care Naethan Bracewell: 08/22/1966 Other Clinician: Referring Emireth Cockerham: Treating Kaiel Weide/Extender: 56 in Treatment: 0 Vital Signs Height(in): 64 Pulse(bpm): 99 Weight(lbs): 280 Blood Pressure(mmHg): 190/94 Body Mass Index(BMI): 48.1 Temperature(F): 98.7 Respiratory Rate(breaths/min): 22 Photos: [N/Cynthia:N/Cynthia] Proximal Abdomen - midline N/Cynthia N/Cynthia Wound Location: Gradually Appeared N/Cynthia N/Cynthia Wounding Event: Lesion N/Cynthia N/Cynthia Primary Etiology: Anemia, Congestive Heart Failure, N/Cynthia N/Cynthia Comorbid  History: Osteoarthritis, Confinement Anxiety 10/19/2021 N/Cynthia N/Cynthia Date Acquired: 0 N/Cynthia N/Cynthia Weeks of Treatment: Healed - Epithelialized N/Cynthia N/Cynthia Wound Status: No N/Cynthia N/Cynthia Wound Recurrence: 0x0x0 N/Cynthia N/Cynthia Measurements L x W x D (cm) 0 N/Cynthia N/Cynthia Cynthia (cm) : rea 0 N/Cynthia N/Cynthia Volume (cm) : 100.00% N/Cynthia N/Cynthia % Reduction in Area: 100.00% N/Cynthia N/Cynthia % Reduction in Volume: Full Thickness Without Exposed N/Cynthia N/Cynthia Classification: Support Structures Small N/Cynthia N/Cynthia Exudate Amount: Serosanguineous  N/Cynthia N/Cynthia Exudate Type: red, brown N/Cynthia N/Cynthia Exudate Color: Flat and Intact N/Cynthia N/Cynthia Wound Margin: Medium (34-66%) N/Cynthia N/Cynthia Granulation Amount: Red N/Cynthia N/Cynthia Granulation Quality: None Present (0%) N/Cynthia N/Cynthia Necrotic Amount: Fat Layer (Subcutaneous Tissue): Yes N/Cynthia N/Cynthia Exposed Structures: Fascia: No Tendon: No Muscle: No Joint: No Bone: No Medium (34-66%) N/Cynthia N/Cynthia Epithelialization: Treatment Notes Electronic Signature(s) Signed: 12/15/2021 9:20:39 AM By: Duanne Guess MD FACS Entered By: Duanne Guess on 12/15/2021 09:20:39 -------------------------------------------------------------------------------- Multi-Disciplinary Care Plan Details Patient Name: Date of Service: [name], [name] 12/15/2021 9:00 Cynthia M Medical Record Number: [id number] Patient Account Number: [id number] Date of Birth/Sex: Treating RN: July 01, 1966 (56 y.o. Cynthia Richardson Primary Care Bobetta Korf: Claybon Jabs Other Clinician: Referring Kayliah Tindol: Treating Selenne Coggin/Extender: Pattricia Boss in Treatment: 0 Multidisciplinary Care Plan reviewed with physician Active Inactive Electronic Signature(s) Signed: 12/15/2021 2:25:17 PM By: Zenaida Deed RN, BSN Entered By: Zenaida Deed on 12/15/2021 09:20:50 -------------------------------------------------------------------------------- Pain Assessment Details Patient Name: Date of Service: Cynthia Richardson, [name] 12/15/2021 9:00 Cynthia M Medical Record Number:  [id number] Patient Account Number: [id number] Date of Birth/Sex: Treating RN: October 21, 1966 (56 y.o. Cynthia Richardson Primary Care Kania Regnier: Claybon Jabs Other Clinician: Referring Colette Dicamillo: Treating Daimon Kean/Extender: Pattricia Boss in Treatment: 0 Active Problems Location of Pain Severity and Description of Pain Patient Has Paino Yes Site Locations Pain Location: Pain in Ulcers With Dressing Change: No Duration of the Pain. Constant / Intermittento Constant Rate the pain. Current Pain Level: 8 Worst Pain Level: 9 Character of Pain Describe the Pain: Aching Pain Management and Medication Current Pain Management: Medication: Yes Is the Current Pain Management Adequate: Adequate How does your wound impact your activities of daily livingo Sleep: Yes Bathing: No Appetite: No Relationship With Others: No Bladder Continence: No Emotions: No Bowel Continence: No Work: No Toileting: No Drive: No Dressing: No Hobbies: No Psychologist, prison and probation services) Signed: 12/15/2021 2:25:17 PM By: Zenaida Deed RN, BSN Entered By: Zenaida Deed on 12/15/2021 08:57:21 -------------------------------------------------------------------------------- Patient/Caregiver Education Details Patient Name: Date of Service: Cynthia Richardson, [name] 2/24/2023andnbsp9:00 Cynthia M Medical Record Number: [id number] Patient Account Number: [id number] Date of Birth/Gender: Treating RN: 1966/10/19 (56 y.o. Cynthia Richardson Primary Care Physician: Claybon Jabs Other Clinician: Referring Physician: Treating Physician/Extender: Pattricia Boss in Treatment: 0 Education Assessment Education Provided To: Patient Education Topics Provided Wound/Skin Impairment: Methods: Explain/Verbal Responses: Reinforcements needed, State content correctly Electronic Signature(s) Signed: 12/15/2021 2:25:17 PM By: Zenaida Deed RN, BSN Entered By: Zenaida Deed on 12/15/2021  09:05:12 -------------------------------------------------------------------------------- Wound Assessment Details Patient Name: Date of Service: Cynthia Richardson, [name] 12/15/2021 9:00 Cynthia M Medical Record Number: [id number] Patient Account Number: [id number] Date of Birth/Sex: Treating RN: [birth year] (56 y.o. Billy Coast, Linda Primary Care Markala Sitts: Claybon Jabs Other Clinician: Referring Glendora Clouatre: Treating Angeles Zehner/Extender: Pattricia Boss in Treatment: 0 Wound Status Wound Number: 1 Primary Lesion Etiology: Wound Location: Proximal Abdomen - midline Wound Status: Healed - Epithelialized Wounding Event: Gradually Appeared Comorbid Anemia, Congestive Heart Failure, Osteoarthritis, Date Acquired: 10/19/2021 History: Confinement Anxiety Weeks Of Treatment: 0 Clustered Wound: No Photos Wound Measurements Length: (cm) Width: (cm) Depth: (cm) Area: (cm) Volume: (cm) 0 % Reduction in Area: 100% 0 % Reduction in Volume: 100% 0 Epithelialization: Medium (34-66%) 0 Tunneling: No 0 Undermining: No Wound Description Classification: Full Thickness Without Exposed Support Structures Wound Margin: Flat and Intact Exudate Amount: Small Exudate Type: Serosanguineous Exudate Color: red, brown Foul Odor After Cleansing: No Slough/Fibrino Yes Wound  Bed Granulation Amount: Medium (34-66%) Exposed Structure Granulation Quality: Red Fascia Exposed: No Necrotic Amount: None Present (0%) Fat Layer (Subcutaneous Tissue) Exposed: Yes Tendon Exposed: No Muscle Exposed: No Joint Exposed: No Bone Exposed: No Electronic Signature(s) Signed: 12/15/2021 2:25:17 PM By: Zenaida Deed RN, BSN Entered By: Zenaida Deed on 12/15/2021 09:18:36 -------------------------------------------------------------------------------- Vitals Details Patient Name: Date of Service: Cynthia Richardson, [name] 12/15/2021 9:00 Cynthia M Medical Record Number: [id number] Patient Account Number:  [id number] Date of Birth/Sex: Treating RN: [birth year] (56 y.o. Cynthia Richardson Primary Care Almin Livingstone: Claybon Jabs Other Clinician: Referring Enzo Treu: Treating Duell Holdren/Extender: Pattricia Boss in Treatment: 0 Vital Signs Time Taken: 08:59 Temperature (F): 98.7 Height (in): 64 Pulse (bpm): 99 Source: Stated Respiratory Rate (breaths/min): 22 Weight (lbs): 280 Blood Pressure (mmHg): 190/94 Source: Stated Reference Range: 80 - 120 mg / dl Body Mass Index (BMI): 48.1 Electronic Signature(s) Signed: 12/15/2021 2:25:17 PM By: Zenaida Deed RN, BSN Entered By: Zenaida Deed on 12/15/2021 09:06:30

## 2021-12-15 NOTE — Progress Notes (Signed)
Cynthia, Richardson ([id number]) Visit Report for 12/15/2021 Chief Complaint Document Details Patient Name: Date of Service: Cynthia Cynthia Richardson. 12/15/2021 9:00 Cynthia M Medical Record Number: [id number] Patient Account Number: [id number] Date of Birth/Sex: Treating RN: [birth year] (56 y.o. F) Primary Care Provider: Heber Little Richardson Other Clinician: Referring Provider: Treating Provider/Extender: Cynthia Richardson in Treatment: 0 Information Obtained from: Patient Chief Complaint 12/15/2021: Patient presents to the wound care center with known wound conditions, specifically bilateral lower extremity lymphedema and lymphedema dermatitis. Electronic Signature(s) Signed: 12/15/2021 9:21:29 AM By: Cynthia Maudlin MD FACS Entered By: Cynthia Richardson on 12/15/2021 09:21:29 -------------------------------------------------------------------------------- HPI Details Patient Name: Date of Service: Cynthia Richardson, [name] 12/15/2021 9:00 Cynthia M Medical Record Number: [id number] Patient Account Number: [id number] Date of Birth/Sex: Treating RN: Mar 27, 1966 (56 y.o. F) Primary Care Provider: Heber  Other Clinician: Referring Provider: Treating Provider/Extender: Cynthia Richardson in Treatment: 0 History of Present Illness HPI Description: 12/11/2021: This patient presents today for further evaluation and management of bilateral lower extremity lymphedema with associated cellulitis. She has Cynthia past medical history that is significant for congestive heart failure, anemia, and morbid obesity. She was treated with 2 separate courses of outpatient antibiotics for lower extremity cellulitis, including cephalexin and ciprofloxacin. When she failed to respond, she was admitted to the hospital in [facility] where she was treated with intravenous antibiotics. According to the electronic medical record, which I have reviewed, she received Cynthia course of IV clindamycin followed by IV vancomycin. She  was also placed in compression and upon discharge from the hospital, the erythema and warmth in her legs had markedly improved, as had the swelling. She was apparently referred to the lymphedema clinic, but she reports she has not yet received an appointment. The erythema and warmth in her legs returned fairly rapidly and she was started on Cynthia course of doxycycline, starting Monday, 04 December 2021. She was prescribed Cynthia 10-day course. She feels like there may be slight improvement in the redness. She continues to have significant swelling, however. She is wearing compression stockings at home, per her report. She thinks these are likely 30 to 40 mmHg compression. She also reports that there is an open wound just above her umbilicus. She says that her skin itches and when she scratches, wounds opened up. She denies any fevers or chills. No nausea or vomiting. No significant drainage or exudate from any of the lesions. No drainage from her legs, but she does say that sometimes, in the past, they have seeped Cynthia clear fluid. 12/15/2021: I changed this patient's antibiotics to Augmentin on Monday, to see if this had any effect on her lower extremity erythema. T oday, they appear essentially the same. Going back into her chart, although she did receive IV antibiotics in [facility], she was also in compression at that time. I think this confirms that her skin changes are more likely lymphedema dermatitis, rather than an infectious process. The small wound on her abdomen has healed. She has not been wearing her compression stockings, and finds it difficult to elevate her legs. She confirmed with her primary care provider's office that the referral to lymphedema clinic has been made, and she is just awaiting their call to schedule Cynthia visit. Electronic Signature(s) Signed: 12/15/2021 9:23:21 AM By: Cynthia Maudlin MD FACS Entered By: Cynthia Richardson on 12/15/2021  09:23:20 -------------------------------------------------------------------------------- Physical Exam Details Patient Name: Date of Service: Cynthia Richardson, [name] 12/15/2021 9:00 Cynthia M Medical Record Number:  [id number] Patient Account Number: [id number] Date of Birth/Sex: Treating RN: Mar 12, 1966 (56 y.o. F) Primary Care Provider: Claybon Richardson Other Clinician: Referring Provider: Treating Provider/Extender: Cynthia Richardson in Treatment: 0 Constitutional She is hypertensive. . . Morbid obesity with Cynthia BMI of 48.1.Marland Kitchen Cardiovascular She continues to have 3+ nonpitting edema in the bilateral lower extremities. The skin is warm and erythematous.. Electronic Signature(s) Signed: 12/15/2021 9:25:00 AM By: Cynthia Guess MD FACS Entered By: Cynthia Richardson on 12/15/2021 09:24:59 -------------------------------------------------------------------------------- Physician Orders Details Patient Name: Date of Service: Cynthia Richardson, [name] 12/15/2021 9:00 Cynthia M Medical Record Number: [id number] Patient Account Number: [id number] Date of Birth/Sex: Treating RN: 12-01-1965 (56 y.o. Cynthia Richardson Primary Care Provider: Claybon Richardson Other Clinician: Referring Provider: Treating Provider/Extender: Cynthia Richardson in Treatment: 0 Verbal / Phone Orders: No Diagnosis Coding ICD-10 Coding Code Description I89.0 Lymphedema, not elsewhere classified L03.115 Cellulitis of right lower limb L03.116 Cellulitis of left lower limb Discharge From [facility] Services Discharge from Wound Care Center Bathing/ Shower/ Hygiene May shower and wash wound with soap and water. Edema Control - Lymphedema / SCD / Other Avoid standing for long periods of time. Exercise regularly Moisturize legs daily. Compression stocking or Garment 30-40 mm/Hg pressure to: - Apply stockings or juxtalites to both legs daily Other Edema Control Orders/Instructions: - Please find out when your  appointment is at Lymphedema Clinic Non Wound Condition Cleanse area with: - soap and water ** Try not to scratch Electronic Signature(s) Signed: 12/15/2021 9:25:46 AM By: Cynthia Guess MD FACS Entered By: Cynthia Richardson on 12/15/2021 09:25:45 -------------------------------------------------------------------------------- Problem List Details Patient Name: Date of Service: Cynthia Richardson, [name] 12/15/2021 9:00 Cynthia M Medical Record Number: [id number] Patient Account Number: [id number] Date of Birth/Sex: Treating RN: 07-Sep-1966 (56 y.o. F) Primary Care Provider: Claybon Richardson Other Clinician: Referring Provider: Treating Provider/Extender: Cynthia Richardson in Treatment: 0 Active Problems ICD-10 Encounter Code Description Active Date MDM Diagnosis I89.0 Lymphedema, not elsewhere classified 12/11/2021 No Yes L03.115 Cellulitis of right lower limb 12/11/2021 No Yes L03.116 Cellulitis of left lower limb 12/11/2021 No Yes Inactive Problems ICD-10 Code Description Active Date Inactive Date S30.811A Abrasion of abdominal wall, initial encounter 12/11/2021 12/11/2021 Resolved Problems Electronic Signature(s) Signed: 12/15/2021 9:20:26 AM By: Cynthia Guess MD FACS Entered By: Cynthia Richardson on 12/15/2021 09:20:26 -------------------------------------------------------------------------------- Progress Note Details Patient Name: Date of Service: Cynthia Richardson, [name] 12/15/2021 9:00 Cynthia M Medical Record Number: [id number] Patient Account Number: [id number] Date of Birth/Sex: Treating RN: 1966/04/18 (56 y.o. F) Primary Care Provider: Claybon Richardson Other Clinician: Referring Provider: Treating Provider/Extender: Cynthia Richardson in Treatment: 0 Subjective Chief Complaint Information obtained from Patient 12/15/2021: Patient presents to the wound care center with known wound conditions, specifically bilateral lower extremity lymphedema and  lymphedema dermatitis. History of Present Illness (HPI) 12/11/2021: This patient presents today for further evaluation and management of bilateral lower extremity lymphedema with associated cellulitis. She has Cynthia past medical history that is significant for congestive heart failure, anemia, and morbid obesity. She was treated with 2 separate courses of outpatient antibiotics for lower extremity cellulitis, including cephalexin and ciprofloxacin. When she failed to respond, she was admitted to the hospital in [facility] where she was treated with intravenous antibiotics. According to the electronic medical record, which I have reviewed, she received Cynthia course of IV clindamycin followed by IV vancomycin. She was also placed in compression and upon discharge from the hospital, the erythema and warmth  in her legs had markedly improved, as had the swelling. She was apparently referred to the lymphedema clinic, but she reports she has not yet received an appointment. The erythema and warmth in her legs returned fairly rapidly and she was started on Cynthia course of doxycycline, starting Monday, 04 December 2021. She was prescribed Cynthia 10- day course. She feels like there may be slight improvement in the redness. She continues to have significant swelling, however. She is wearing compression stockings at home, per her report. She thinks these are likely 30 to 40 mmHg compression. She also reports that there is an open wound just above her umbilicus. She says that her skin itches and when she scratches, wounds opened up. She denies any fevers or chills. No nausea or vomiting. No significant drainage or exudate from any of the lesions. No drainage from her legs, but she does say that sometimes, in the past, they have seeped Cynthia clear fluid. 12/15/2021: I changed this patient's antibiotics to Augmentin on Monday, to see if this had any effect on her lower extremity erythema. T oday, they appear essentially the same. Going  back into her chart, although she did receive IV antibiotics in [facility], she was also in compression at that time. I think this confirms that her skin changes are more likely lymphedema dermatitis, rather than an infectious process. The small wound on her abdomen has healed. She has not been wearing her compression stockings, and finds it difficult to elevate her legs. She confirmed with her primary care provider's office that the referral to lymphedema clinic has been made, and she is just awaiting their call to schedule Cynthia visit. Patient History Information obtained from Patient. Family History Unknown History. Social History Never smoker, Marital Status - Divorced, Alcohol Use - Rarely - wine, Drug Use - No History, Caffeine Use - Daily - soda;coffee. Medical History Hematologic/Lymphatic Patient has history of Anemia - was in hospital Nov 04 2021 -pt. stated she got Iron in hospital Cardiovascular Patient has history of Congestive Heart Failure - in 2015. Takes Lasix daily Musculoskeletal Patient has history of Osteoarthritis Psychiatric Patient has history of Confinement Anxiety Denies history of Anorexia/bulimia Hospitalization/Surgery History - R+L knee arthoplasty;Right hip replacement;Hysterectomy;Appendectomy; Back surgery. Medical Cynthia Surgical History Notes nd Eyes wears eye glasses Ear/Nose/Mouth/Throat Pt. stated has an ulcer on the tongue and needs dental work Respiratory Pt. states that she needs to get tested for sleep apnea Cardiovascular Had Cynthia myocardial infarction in 2015-as per pt. Musculoskeletal Hx: R +L knee arthroplasty. Right hip replacement Neurologic Pt. stated that she had Cynthia seizure which resulted in " hypertensive encephalopathy"- 2013 Psychiatric Pt. stated she has bipolar disorder;PTSD;Schizophrenia Objective Constitutional She is hypertensive. Morbid obesity with Cynthia BMI of 48.1.. Vitals Time Taken: 8:59 AM, Height: 64 in, Source: Stated, Weight:  280 lbs, Source: Stated, BMI: 48.1, Temperature: 98.7 F, Pulse: 99 bpm, Respiratory Rate: 22 breaths/min, Blood Pressure: 190/94 mmHg. Cardiovascular She continues to have 3+ nonpitting edema in the bilateral lower extremities. The skin is warm and erythematous.. Integumentary (Hair, Skin) Wound #1 status is Healed - Epithelialized. Original cause of wound was Gradually Appeared. The date acquired was: 10/19/2021. The wound is located on the Proximal Abdomen - midline. The wound measures 0cm length x 0cm width x 0cm depth; 0cm^2 area and 0cm^3 volume. There is Fat Layer (Subcutaneous Tissue) exposed. There is no tunneling or undermining noted. There is Cynthia small amount of serosanguineous drainage noted. The wound margin is flat and intact. There  is medium (34-66%) red granulation within the wound bed. There is no necrotic tissue within the wound bed. Assessment Active Problems ICD-10 Lymphedema, not elsewhere classified Cellulitis of right lower limb Cellulitis of left lower limb Plan Discharge From [facility] Services: Discharge from [place] Bathing/ Shower/ Hygiene: May shower and wash wound with soap and water. Edema Control - Lymphedema / SCD / Other: Avoid standing for long periods of time. Exercise regularly Moisturize legs daily. Compression stocking or Garment 30-40 mm/Hg pressure to: - Apply stockings or juxtalites to both legs daily Other Edema Control Orders/Instructions: - Please find out when your appointment is at Lymphedema Clinic Non Wound Condition: Cleanse area with: - soap and water ** Try not to scratch 12/15/2021: I think the failure to respond today antibiotics, combined with the history of improvement while in compression confirms my suspicion that this is lymphedema dermatitis, rather than an infectious disease process. The excoriations on her abdomen have healed. She does not have an open wound that requires our care. I think she would benefit greatly,  however, from evaluation and treatment in the lymphedema clinic. She reports that she does have Cynthia referral in place for this. In the meantime, I stressed to her the importance of wearing compression hose at all times during the day and making Cynthia concerted effort to keep her legs elevated whenever possible. We will see her on an as-needed basis, discharging her from clinic today. Electronic Signature(s) Signed: 12/15/2021 9:27:42 AM By: Cynthia Maudlin MD FACS Entered By: Cynthia Richardson on 12/15/2021 09:27:42 -------------------------------------------------------------------------------- HxROS Details Patient Name: Date of Service: Cynthia Richardson, [name] 12/15/2021 9:00 Cynthia M Medical Record Number: [id number] Patient Account Number: [id number] Date of Birth/Sex: Treating RN: [birth year] (56 y.o. F) Primary Care Provider: Heber [place] Other Clinician: Referring Provider: Treating Provider/Extender: Cynthia Richardson in Treatment: 0 Information Obtained From Patient Eyes Medical History: Past Medical History Notes: wears eye glasses Ear/Nose/Mouth/Throat Medical History: Past Medical History Notes: Pt. stated has an ulcer on the tongue and needs dental work Hematologic/Lymphatic Medical History: Positive for: Anemia - was in hospital Nov 04 2021 -pt. stated she got Iron in hospital Respiratory Medical History: Past Medical History Notes: Pt. states that she needs to get tested for sleep apnea Cardiovascular Medical History: Positive for: Congestive Heart Failure - in 2015. Takes Lasix daily Past Medical History Notes: Had Cynthia myocardial infarction in 2015-as per pt. Musculoskeletal Medical History: Positive for: Osteoarthritis Past Medical History Notes: Hx: R +L knee arthroplasty. Right hip replacement Neurologic Medical History: Past Medical History Notes: Pt. stated that she had Cynthia seizure which resulted in " hypertensive encephalopathy"-  2013 Psychiatric Medical History: Positive for: Confinement Anxiety Negative for: Anorexia/bulimia Past Medical History Notes: Pt. stated she has bipolar disorder;PTSD;Schizophrenia Immunizations Pneumococcal Vaccine: Received Pneumococcal Vaccination: Yes Received Pneumococcal Vaccination On or After 60th Birthday: No Implantable Devices None Hospitalization / Surgery History Type of Hospitalization/Surgery R+L knee arthoplasty;Right hip replacement;Hysterectomy;Appendectomy; Back surgery Family and Social History Unknown History: Yes; Never smoker; Marital Status - Divorced; Alcohol Use: Rarely - wine; Drug Use: No History; Caffeine Use: Daily - soda;coffee; Financial Concerns: No; Food, Clothing or Shelter Needs: No; Support System Lacking: No; Transportation Concerns: Yes - Pt. doesn't drive Physician Affirmation I have reviewed and agree with the above information. Electronic Signature(s) Signed: 12/15/2021 9:28:25 AM By: Cynthia Maudlin MD FACS Entered By: Cynthia Richardson on 12/15/2021 09:23:32 -------------------------------------------------------------------------------- SuperBill Details Patient Name: Date of Service: Cynthia Richardson, [name] 12/15/2021 Medical Record  Number: [id number] Patient Account Number: [id number] Date of Birth/Sex: Treating RN: [birth year] (56 y.o. Elam Dutch Primary Care Provider: Heber [place] Other Clinician: Referring Provider: Treating Provider/Extender: Cynthia Richardson in Treatment: 0 Diagnosis Coding ICD-10 Codes Code Description I89.0 Lymphedema, not elsewhere classified L03.115 Cellulitis of right lower limb L03.116 Cellulitis of left lower limb Facility Procedures CPT4 Code: [id number] Description: [id number] - WOUND CARE VISIT-LEV 3 EST PT Modifier: Quantity: 1 Physician Procedures : CPT4 Code Description Modifier [id number] - WC PHYS LEVEL 3 - EST PT ICD-10 Diagnosis Description I89.0 Lymphedema, not  elsewhere classified Quantity: 1 Electronic Signature(s) Signed: 12/15/2021 9:28:03 AM By: Cynthia Maudlin MD FACS Entered By: Cynthia Richardson on 12/15/2021 09:28:03

## 2022-01-31 NOTE — Progress Notes (Deleted)
?Cardiology Office Note:   ? ?Date:  01/31/2022  ? ?ID:  Cynthia Richardson, DOB 1966-05-28, MRN [id number] ? ?PCP:  Claybon Jabs, PA-C  ?Cardiologist:  Norman Herrlich, MD   ? ?Referring MD: Claybon Jabs, PA-C  ? ? ?ASSESSMENT:   ? ?No diagnosis found. ?PLAN:   ? ?In order of problems listed above: ? ?*** ? ? ?Next appointment: *** ? ? ?Medication Adjustments/Labs and Tests Ordered: ?Current medicines are reviewed at length with the patient today.  Concerns regarding medicines are outlined above.  ?No orders of the defined types were placed in this encounter. ? ?No orders of the defined types were placed in this encounter. ? ? ?No chief complaint on file. ? ? ?History of Present Illness:   ? ?Cynthia Richardson is a 56 y.o. female with a hx of chest pain with previous normal coronary arteriography 2015 chest pain without findings of acute coronary syndrome mildly abnormal myocardial perfusion study with anterior perfusion defect hypertension hyperlipidemia last seen discharge [facility] 09/18/2021 with plan to refer for cardiac CTA as outpatient.Marland Kitchen ?Compliance with diet, lifestyle and medications: *** ?Past Medical History:  ?Diagnosis Date  ? Anxiety   ? Arthritis   ? Bipolar disorder (HCC)   ? Bulging lumbar disc   ? CHF (congestive heart failure) (HCC)   ? 2011  ? Chronic back pain   ? Degenerative disc disease   ? Depression   ? Dyspnea   ? Headache   ? History of kidney stones   ? Hypertension   ? Hypertensive encephalopathy   ? Nonischemic cardiomyopathy (HCC)   ? stress inducted (Takotsubo type) 12/2009 in the setting of unintentional drug OD with acute respiratory and renal failure; No CAD by 12/2009 cath; EF recovered (> 55% '15, '17)  ? Osteoarthritis of knees, bilateral   ? Pneumonia   ? Respiratory failure (HCC)   ? March 2011  ? Seizures (HCC)   ? ? ?Past Surgical History:  ?Procedure Laterality Date  ? ABDOMINAL HYSTERECTOMY    ? APPENDECTOMY    ? CHOLECYSTECTOMY    ? JOINT REPLACEMENT    ? TONSILLECTOMY    ?  TOTAL KNEE ARTHROPLASTY Right 05/13/2017  ? Procedure: RIGHT TOTAL KNEE ARTHROPLASTY;  Surgeon: Dannielle Huh, MD;  Location: [facility] OR;  Service: Orthopedics;  Laterality: Right;  ? TOTAL KNEE ARTHROPLASTY Left 08/19/2017  ? Procedure: TOTAL KNEE ARTHROPLASTY;  Surgeon: Dannielle Huh, MD;  Location: [facility] OR;  Service: Orthopedics;  Laterality: Left;  ? ? ?Current Medications: ?No outpatient medications have been marked as taking for the 02/01/22 encounter (Appointment) with Baldo Daub, MD.  ?  ? ?Allergies:   Patient has no known allergies.  ? ?Social History  ? ?Socioeconomic History  ? Marital status: Divorced  ?  Spouse name: Not on file  ? Number of children: Not on file  ? Years of education: Not on file  ? Highest education level: Not on file  ?Occupational History  ? Not on file  ?Tobacco Use  ? Smoking status: Never  ? Smokeless tobacco: Never  ?Vaping Use  ? Vaping Use: Never used  ?Substance and Sexual Activity  ? Alcohol use: No  ? Drug use: No  ?  Types: Hydrocodone, Hydromorphone, Benzodiazepines, Cocaine  ?  Comment: last used 09/05/2011  ? Sexual activity: Never  ?Other Topics Concern  ? Not on file  ?Social History Narrative  ? Not on file  ? ?Social Determinants of Health  ? ?Physicist, medical  Strain: Not on file  ?Food Insecurity: Not on file  ?Transportation Needs: Not on file  ?Physical Activity: Not on file  ?Stress: Not on file  ?Social Connections: Not on file  ?  ? ?Family History: ?The patient's ***family history includes Emphysema in her mother; Hypertension in her mother; Liver disease in her father. ?ROS:   ?Please see the history of present illness.    ?All other systems reviewed and are negative. ? ?EKGs/Labs/Other Studies Reviewed:   ? ?The following studies were reviewed today: ? ?EKG:  EKG ordered today and personally reviewed.  The ekg ordered today demonstrates *** ? ?Recent Labs: ?No results found for requested labs within last 8760 hours.  ?Recent Lipid Panel ?No results found for:  CHOL, TRIG, HDL, CHOLHDL, VLDL, LDLCALC, LDLDIRECT ? ?Physical Exam:   ? ?VS:  There were no vitals taken for this visit.   ? ?Wt Readings from Last 3 Encounters:  ?04/16/18 261 lb 1.6 oz (118.4 kg)  ?04/08/18 261 lb 1.6 oz (118.4 kg)  ?08/22/17 264 lb 8.8 oz (120 kg)  ?  ? ?GEN: *** Well nourished, well developed in no acute distress ?HEENT: Normal ?NECK: No JVD; No carotid bruits ?LYMPHATICS: No lymphadenopathy ?CARDIAC: ***RRR, no murmurs, rubs, gallops ?RESPIRATORY:  Clear to auscultation without rales, wheezing or rhonchi  ?ABDOMEN: Soft, non-tender, non-distended ?MUSCULOSKELETAL:  No edema; No deformity  ?SKIN: Warm and dry ?NEUROLOGIC:  Alert and oriented x 3 ?PSYCHIATRIC:  Normal affect  ? ? ?Signed, ?Norman Herrlich, MD  ?01/31/2022 5:22 PM    ?[facility]  ?

## 2022-02-01 ENCOUNTER — Ambulatory Visit: Payer: Medicare Other | Admitting: Cardiology

## 2022-03-15 ENCOUNTER — Telehealth: Payer: Self-pay

## 2022-03-15 NOTE — Telephone Encounter (Signed)
Pt has appt 05/30/22 with Dr, Dulce Sellar. Will see Dr. Dulce Sellar office may be able to get the pt in sooner for pre op clearance needed as well. Looks this appt was just made 03/12/22.   Will send FYI to requesting office pt at this time has appt 05/30/22 with Dr. Dulce Sellar

## 2022-03-15 NOTE — Telephone Encounter (Signed)
   Pre-operative Risk Assessment    Patient Name: Cynthia Richardson  DOB: 03-22-1966 MRN: [id number]      Request for Surgical Clearance    Procedure:   Total Hip Arthroplasty  Date of Surgery:  Clearance TBD                                 Surgeon:  Dr Hilda Lias, MD Surgeon's Group or Practice Name:  [facility] - Orthopedics Department Phone number:  [phone] Fax number:  [phone]   Type of Clearance Requested:   - Medical    Type of Anesthesia:  Spinal   Additional requests/questions:    SignedViviano Simas   03/15/2022, 7:22 AM

## 2022-03-15 NOTE — Telephone Encounter (Signed)
Primary Cardiologist:Paula Tenny Craw, MD  Chart reviewed as part of pre-operative protocol coverage. Because of Cynthia Richardson's past medical history and time since last visit, he/she will require a follow-up visit in order to better assess preoperative cardiovascular risk.  Pre-op covering staff: - Please schedule appointment and call patient to inform them. - Please contact requesting surgeon's office via preferred method (i.e, phone, fax) to inform them of need for appointment prior to surgery.  If applicable, this message will also be routed to pharmacy pool and/or primary cardiologist for input on holding anticoagulant/antiplatelet agent as requested below so that this information is available at time of patient's appointment.   Ronney Asters, NP  03/15/2022, 10:32 AM

## 2022-04-05 ENCOUNTER — Ambulatory Visit: Payer: Medicare Other | Admitting: Cardiology

## 2022-04-05 NOTE — Progress Notes (Deleted)
Cardiology Office Note:    Date:  04/05/2022   ID:  Cynthia Richardson, DOB 05/11/1966, MRN [id number]  PCP:  Claybon Jabs, PA-C  Cardiologist:  Norman Herrlich, MD   Referring MD: Claybon Jabs, PA-C  ASSESSMENT:    No diagnosis found. PLAN:    In order of problems listed above:  ***  Next appointment   Medication Adjustments/Labs and Tests Ordered: Current medicines are reviewed at length with the patient today.  Concerns regarding medicines are outlined above.  No orders of the defined types were placed in this encounter.  No orders of the defined types were placed in this encounter.    No chief complaint on file. ***  History of Present Illness:    Cynthia Richardson is a 56 y.o. female with a history of hypertension hyperlipidemia remote coronary angiogram from 2015 no report available described as normal who is being seen today for the evaluation of *** at the request of Claybon Jabs, PA-C.  She was seen by Dr. Judithe Modest [facility] cardiology 08/28/2021 in consultation.  His background history of hypertension background history of coronary angiography records are unavailable and echocardiogram 2019 showed normal left ventricular systolic diastolic function and a perfusion study showed no evidence of ischemia.  She was seen for shortness of breath diagnosed as hypertensive heart disease with heart failure  She was discharged from [facility]/16/2021 with a discharge diagnosis of chest pain her perfusion study showed the presence of perfusion defect in the anterior wall showing partial reversibility possible ischemia.  The patient elected to undergo cardiac CTA as an outpatient ordered never performed.  Her hemoglobin was mildly diminished 11.8 GFR greater than 60 cc troponin was undetectable proBNP level was low.  Cholesterol is 116 LDL 53 Past Medical History:  Diagnosis Date   Anxiety    Arthritis    Bipolar disorder (HCC)    Bulging lumbar disc    CHF  (congestive heart failure) (HCC)    2011   Chronic back pain    Degenerative disc disease    Depression    Dyspnea    Headache    History of kidney stones    Hypertension    Hypertensive encephalopathy    Nonischemic cardiomyopathy (HCC)    stress inducted (Takotsubo type) 12/2009 in the setting of unintentional drug OD with acute respiratory and renal failure; No CAD by 12/2009 cath; EF recovered (> 55% '15, '17)   Osteoarthritis of knees, bilateral    Pneumonia    Respiratory failure (HCC)    March 2011   Seizures [facility])     Past Surgical History:  Procedure Laterality Date   ABDOMINAL HYSTERECTOMY     APPENDECTOMY     CHOLECYSTECTOMY     JOINT REPLACEMENT     TONSILLECTOMY     TOTAL KNEE ARTHROPLASTY Right 05/13/2017   Procedure: RIGHT TOTAL KNEE ARTHROPLASTY;  Surgeon: Dannielle Huh, MD;  Location: [facility] OR;  Service: Orthopedics;  Laterality: Right;   TOTAL KNEE ARTHROPLASTY Left 08/19/2017   Procedure: TOTAL KNEE ARTHROPLASTY;  Surgeon: Dannielle Huh, MD;  Location: [facility] OR;  Service: Orthopedics;  Laterality: Left;    Current Medications: No outpatient medications have been marked as taking for the 04/05/22 encounter (Appointment) with Baldo Daub, MD.     Allergies:   Patient has no known allergies.   Social History   Socioeconomic History   Marital status: Divorced    Spouse name: Not on file   Number of  children: Not on file   Years of education: Not on file   Highest education level: Not on file  Occupational History   Not on file  Tobacco Use   Smoking status: Never   Smokeless tobacco: Never  Vaping Use   Vaping Use: Never used  Substance and Sexual Activity   Alcohol use: No   Drug use: No    Types: Hydrocodone, Hydromorphone, Benzodiazepines, Cocaine    Comment: last used 09/05/2011   Sexual activity: Never  Other Topics Concern   Not on file  Social History Narrative   Not on file   Social Determinants of Health   Financial Resource Strain:  Not on file  Food Insecurity: Not on file  Transportation Needs: Not on file  Physical Activity: Not on file  Stress: Not on file  Social Connections: Not on file     Family History: The patient's ***family history includes Emphysema in her mother; Hypertension in her mother; Liver disease in her father.  ROS:   ROS Please see the history of present illness.    *** All other systems reviewed and are negative.  EKGs/Labs/Other Studies Reviewed:    The following studies were reviewed today: ***  EKG:  EKG is *** ordered today.  The ekg ordered today is personally reviewed and demonstrates ***  Recent Labs: No results found for requested labs within last 365 days.  Recent Lipid Panel No results found for: "CHOL", "TRIG", "HDL", "CHOLHDL", "VLDL", "LDLCALC", "LDLDIRECT"  Physical Exam:    VS:  There were no vitals taken for this visit.    Wt Readings from Last 3 Encounters:  04/16/18 261 lb 1.6 oz (118.4 kg)  04/08/18 261 lb 1.6 oz (118.4 kg)  08/22/17 264 lb 8.8 oz (120 kg)     GEN: *** Well nourished, well developed in no acute distress HEENT: Normal NECK: No JVD; No carotid bruits LYMPHATICS: No lymphadenopathy CARDIAC: ***RRR, no murmurs, rubs, gallops RESPIRATORY:  Clear to auscultation without rales, wheezing or rhonchi  ABDOMEN: Soft, non-tender, non-distended MUSCULOSKELETAL:  No edema; No deformity  SKIN: Warm and dry NEUROLOGIC:  Alert and oriented x 3 PSYCHIATRIC:  Normal affect     Signed, Norman Herrlich, MD  04/05/2022 12:20 PM    [facility]

## 2022-05-29 ENCOUNTER — Other Ambulatory Visit: Payer: Self-pay

## 2022-05-29 DIAGNOSIS — F32A Depression, unspecified: Secondary | ICD-10-CM | POA: Insufficient documentation

## 2022-05-29 DIAGNOSIS — F319 Bipolar disorder, unspecified: Secondary | ICD-10-CM | POA: Insufficient documentation

## 2022-05-29 DIAGNOSIS — I428 Other cardiomyopathies: Secondary | ICD-10-CM | POA: Insufficient documentation

## 2022-05-29 NOTE — Progress Notes (Deleted)
Cardiology Office Note:    Date:  05/29/2022   ID:  [name], DOB 08/18/1966, MRN [id number]  PCP:  Claybon Jabs, PA-C  Cardiologist:  Norman Herrlich, MD    Referring MD: Claybon Jabs, PA-C    ASSESSMENT:    No diagnosis found. PLAN:    In order of problems listed above:  ***   Next appointment: ***   Medication Adjustments/Labs and Tests Ordered: Current medicines are reviewed at length with the patient today.  Concerns regarding medicines are outlined above.  No orders of the defined types were placed in this encounter.  No orders of the defined types were placed in this encounter.   No chief complaint on file.   History of Present Illness:    Cynthia Richardson is a 56 y.o. female with a hx of chest pain and abnormal myocardial perfusion study at [facility] without evidence of acute coronary syndrome last seen by me 09/16/2021 advised cardiac CTA and previously last seen by Dr. Dietrich Pates in 2011.Marland Kitchen  She was felt to have had Takotsubo syndrome with quick normalization of ejection fraction presenting with ST elevation elevated troponin in the setting of hypoxemia intubation and drug overdose.  She has been receiving cardiology care through [facility] seen 08/28/2021 Diagnoses include hypertensive heart disease with heart failure and chest discomfort. Her EKG at that time showed sinus rhythm and was normal Testing echocardiogram myocardial perfusion study ordered not performed. Compliance with diet, lifestyle and medications: *** Past Medical History:  Diagnosis Date   Anxiety    Arthritis    Bipolar disorder (HCC)    Bipolar disorder, current episode depressed, moderate (HCC) 08/16/2014   Bulging lumbar disc    CARDIOMYOPATHY 01/19/2010   Qualifier: Diagnosis of  By: Kem Parkinson     CEREBRAL EDEMA 01/19/2010   Qualifier: Diagnosis of  By: Kem Parkinson     Chest discomfort 08/28/2021   CHF (congestive heart failure) (HCC)    2011    Chronic back pain    Depression    Dyspnea    Essential hypertension 01/13/2018   Headache    History of kidney stones    Hypertension    Hypertensive encephalopathy    Mood disorder (HCC) 01/08/2014   Nonischemic cardiomyopathy (HCC)    stress inducted (Takotsubo type) 12/2009 in the setting of unintentional drug OD with acute respiratory and renal failure; No CAD by 12/2009 cath; EF recovered (> 55% '15, '17)   Opioid dependence (HCC) 01/13/2018   Osteoarthritis of knees, bilateral    Pain in joint, lower leg 02/02/2016   Pain in right hip 02/12/2019   Formatting of this note might be different from the original. Added automatically from request for surgery [id number]   Pneumonia    Posttraumatic stress disorder 08/16/2014   Primary osteoarthritis of right hip 03/12/2019   PULMONARY EDEMA 01/19/2010   Qualifier: Diagnosis of  By: Kem Parkinson     RENAL FAILURE 01/19/2010   Qualifier: Diagnosis of  By: Kem Parkinson     RESPIRATORY FAILURE 01/19/2010   Qualifier: Diagnosis of  By: Kem Parkinson     S/P total knee replacement 05/13/2017   Seizures (HCC)    Shortness of breath 08/28/2021   Spondylolisthesis of lumbar region 04/16/2018   Swelling 01/14/2014    Past Surgical History:  Procedure Laterality Date   ABDOMINAL HYSTERECTOMY     APPENDECTOMY     CHOLECYSTECTOMY     JOINT REPLACEMENT  TONSILLECTOMY     TOTAL KNEE ARTHROPLASTY Right 05/13/2017   Procedure: RIGHT TOTAL KNEE ARTHROPLASTY;  Surgeon: Dannielle Huh, MD;  Location: [facility] OR;  Service: Orthopedics;  Laterality: Right;   TOTAL KNEE ARTHROPLASTY Left 08/19/2017   Procedure: TOTAL KNEE ARTHROPLASTY;  Surgeon: Dannielle Huh, MD;  Location: [facility] OR;  Service: Orthopedics;  Laterality: Left;    Current Medications: No outpatient medications have been marked as taking for the 05/30/22 encounter (Appointment) with Baldo Daub, MD.     Allergies:   Patient has no known allergies.   Social History   Socioeconomic  History   Marital status: Divorced    Spouse name: Not on file   Number of children: Not on file   Years of education: Not on file   Highest education level: Not on file  Occupational History   Not on file  Tobacco Use   Smoking status: Never   Smokeless tobacco: Never  Vaping Use   Vaping Use: Never used  Substance and Sexual Activity   Alcohol use: No   Drug use: No    Types: Hydrocodone, Hydromorphone, Benzodiazepines, Cocaine    Comment: last used 09/05/2011   Sexual activity: Never  Other Topics Concern   Not on file  Social History Narrative   Not on file   Social Determinants of Health   Financial Resource Strain: Not on file  Food Insecurity: Not on file  Transportation Needs: Not on file  Physical Activity: Not on file  Stress: Not on file  Social Connections: Not on file     Family History: The patient's ***family history includes Emphysema in her mother; Hypertension in her mother; Liver disease in her father. ROS:   Please see the history of present illness.    All other systems reviewed and are negative.  EKGs/Labs/Other Studies Reviewed:    The following studies were reviewed today:  EKG:  EKG ordered today and personally reviewed.  The ekg ordered today demonstrates ***  Recent Labs: No results found for requested labs within last 365 days.  Recent Lipid Panel No results found for: "CHOL", "TRIG", "HDL", "CHOLHDL", "VLDL", "LDLCALC", "LDLDIRECT"  Physical Exam:    VS:  There were no vitals taken for this visit.    Wt Readings from Last 3 Encounters:  04/16/18 261 lb 1.6 oz (118.4 kg)  04/08/18 261 lb 1.6 oz (118.4 kg)  08/22/17 264 lb 8.8 oz (120 kg)     GEN: *** Well nourished, well developed in no acute distress HEENT: Normal NECK: No JVD; No carotid bruits LYMPHATICS: No lymphadenopathy CARDIAC: ***RRR, no murmurs, rubs, gallops RESPIRATORY:  Clear to auscultation without rales, wheezing or rhonchi  ABDOMEN: Soft, non-tender,  non-distended MUSCULOSKELETAL:  No edema; No deformity  SKIN: Warm and dry NEUROLOGIC:  Alert and oriented x 3 PSYCHIATRIC:  Normal affect    Signed, Norman Herrlich, MD  05/29/2022 8:19 PM    [facility]

## 2022-05-30 ENCOUNTER — Ambulatory Visit: Payer: Medicare Other | Admitting: Cardiology

## 2022-08-08 ENCOUNTER — Ambulatory Visit: Payer: Medicare HMO | Attending: Cardiology | Admitting: Cardiology

## 2022-08-08 NOTE — Progress Notes (Deleted)
Cardiology Office Note:    Date:  08/08/2022   ID:  Cynthia Richardson, DOB 1966/10/19, MRN [id number]  PCP:  Heber Jeanerette, PA-C  Cardiologist:  Shirlee More, MD    Referring MD: Heber Bradbury, PA-C    ASSESSMENT:    No diagnosis found. PLAN:    In order of problems listed above:  ***   Next appointment: ***   Medication Adjustments/Labs and Tests Ordered: Current medicines are reviewed at length with the patient today.  Concerns regarding medicines are outlined above.  No orders of the defined types were placed in this encounter.  No orders of the defined types were placed in this encounter.   No chief complaint on file.   History of Present Illness:    Cynthia Richardson is a 56 y.o. female with a hx of chest pain and abnormal myocardial perfusion study hypertension hyperlipidemia last seen 09/16/2021 at [facility].  The patient opted to undergo cardiac CTA as an outpatient as opposed to coronary angiography.  It was never done.  The perfusion study showed a defect in the edge wall of the left ventricle with concerns of ischemia and normal left ventricular function and I reviewed the imaging but there is a significant degree of attenuation.  She did not have acute coronary syndrome.  Her EKG was normal troponins were undetectable.   Compliance with diet, lifestyle and medications: *** Past Medical History:  Diagnosis Date   Anxiety    Arthritis    Bipolar disorder ([place])    Bipolar disorder, current episode depressed, moderate ([place]) 08/16/2014   Bulging lumbar disc    CARDIOMYOPATHY 01/19/2010   Qualifier: Diagnosis of  By: Barnes, [place] 01/19/2010   Qualifier: Diagnosis of  By: Burnett Kanaris     Chest discomfort 08/28/2021   CHF (congestive heart failure) ([place])    2011   Chronic back pain    Depression    Dyspnea    Essential hypertension 01/13/2018   Headache    History of kidney stones    Hypertension    Hypertensive encephalopathy     Mood disorder ([place]) 01/08/2014   Nonischemic cardiomyopathy ([place])    stress inducted (Takotsubo type) 12/2009 in the setting of unintentional drug OD with acute respiratory and renal failure; No CAD by 12/2009 cath; EF recovered (> 55% '15, '17)   Opioid dependence ([place]) 01/13/2018   Osteoarthritis of knees, bilateral    Pain in joint, lower leg 02/02/2016   Pain in right hip 02/12/2019   Formatting of this note might be different from the original. Added automatically from request for surgery [id number]   Pneumonia    Posttraumatic stress disorder 08/16/2014   Primary osteoarthritis of right hip 03/12/2019   PULMONARY EDEMA 01/19/2010   Qualifier: Diagnosis of  By: Burnett Kanaris     RENAL FAILURE 01/19/2010   Qualifier: Diagnosis of  By: Barnes, [place] 01/19/2010   Qualifier: Diagnosis of  By: Burnett Kanaris     S/P total knee replacement 05/13/2017   Seizures ([place])    Shortness of breath 08/28/2021   Spondylolisthesis of lumbar region 04/16/2018   Swelling 01/14/2014    Past Surgical History:  Procedure Laterality Date   ABDOMINAL HYSTERECTOMY     APPENDECTOMY     CHOLECYSTECTOMY     JOINT REPLACEMENT     TONSILLECTOMY     TOTAL KNEE ARTHROPLASTY Right 05/13/2017   Procedure: RIGHT TOTAL KNEE ARTHROPLASTY;  Surgeon: Dannielle Huh, MD;  Location[facility];  Service: Orthopedics;  Laterality: Right;   TOTAL KNEE ARTHROPLASTY Left 08/19/2017   Procedure: TOTAL KNEE ARTHROPLASTY;  Surgeon: Dannielle Huh, MD;  Location: [facility] OR;  Service: Orthopedics;  Laterality: Left;    Current Medications: No outpatient medications have been marked as taking for the 08/08/22 encounter (Appointment) with Baldo Daub, MD.     Allergies:   Patient has no known allergies.   Social History   Socioeconomic History   Marital status: Divorced    Spouse name: Not on file   Number of children: Not on file   Years of education: Not on file   Highest education level: Not on file   Occupational History   Not on file  Tobacco Use   Smoking status: Never   Smokeless tobacco: Never  Vaping Use   Vaping Use: Never used  Substance and Sexual Activity   Alcohol use: No   Drug use: No    Types: Hydrocodone, Hydromorphone, Benzodiazepines, Cocaine    Comment: last used 09/05/2011   Sexual activity: Never  Other Topics Concern   Not on file  Social History Narrative   Not on file   Social Determinants of Health   Financial Resource Strain: Not on file  Food Insecurity: Not on file  Transportation Needs: Not on file  Physical Activity: Not on file  Stress: Not on file  Social Connections: Not on file     Family History: The patient's ***family history includes Emphysema in her mother; Hypertension in her mother; Liver disease in her father. ROS:   Please see the history of present illness.    All other systems reviewed and are negative.  EKGs/Labs/Other Studies Reviewed:    The following studies were reviewed today:  EKG:  EKG ordered today and personally reviewed.  The ekg ordered today demonstrates ***  Recent Labs: No results found for requested labs within last 365 days.  Recent Lipid Panel No results found for: "CHOL", "TRIG", "HDL", "CHOLHDL", "VLDL", "LDLCALC", "LDLDIRECT"  Physical Exam:    VS:  There were no vitals taken for this visit.    Wt Readings from Last 3 Encounters:  04/16/18 261 lb 1.6 oz (118.4 kg)  04/08/18 261 lb 1.6 oz (118.4 kg)  08/22/17 264 lb 8.8 oz (120 kg)     GEN: *** Well nourished, well developed in no acute distress HEENT: Normal NECK: No JVD; No carotid bruits LYMPHATICS: No lymphadenopathy CARDIAC: ***RRR, no murmurs, rubs, gallops RESPIRATORY:  Clear to auscultation without rales, wheezing or rhonchi  ABDOMEN: Soft, non-tender, non-distended MUSCULOSKELETAL:  No edema; No deformity  SKIN: Warm and dry NEUROLOGIC:  Alert and oriented x 3 PSYCHIATRIC:  Normal affect    Signed, Norman Herrlich, MD   08/08/2022 7:58 AM    [facility]

## 2022-08-13 ENCOUNTER — Encounter: Payer: Self-pay | Admitting: Cardiology

## 2022-08-15 NOTE — Telephone Encounter (Signed)
Our office received a duplicate request today from the original clearance 03/15/22. I will update the surgeon office the pt needs an appt in the office and has no showed x 3 since the original clearance request. Pt will need appt before she can be cleared.   She will need to call the office and schedule an appt in the office for pre op clearance.

## 2022-08-23 ENCOUNTER — Ambulatory Visit: Payer: Medicare HMO | Admitting: Cardiology

## 2022-08-28 ENCOUNTER — Ambulatory Visit: Payer: Medicare HMO | Attending: Internal Medicine | Admitting: Internal Medicine

## 2023-06-13 ENCOUNTER — Ambulatory Visit: Payer: 59 | Admitting: Physician Assistant

## 2023-06-18 ENCOUNTER — Encounter: Payer: Self-pay | Admitting: Psychiatry

## 2023-06-18 ENCOUNTER — Other Ambulatory Visit: Payer: Self-pay

## 2023-06-18 ENCOUNTER — Inpatient Hospital Stay
Admission: AD | Admit: 2023-06-18 | Discharge: 2023-06-25 | DRG: 885 | Disposition: A | Discharge status: Home / Self Care | Payer: 59 | Source: Other Acute Inpatient Hospital | Attending: Psychiatry | Admitting: Psychiatry

## 2023-06-18 DIAGNOSIS — Z90710 Acquired absence of both cervix and uterus: Secondary | ICD-10-CM

## 2023-06-18 DIAGNOSIS — Z96653 Presence of artificial knee joint, bilateral: Secondary | ICD-10-CM | POA: Diagnosis present

## 2023-06-18 DIAGNOSIS — F419 Anxiety disorder, unspecified: Secondary | ICD-10-CM | POA: Diagnosis present

## 2023-06-18 DIAGNOSIS — I110 Hypertensive heart disease with heart failure: Secondary | ICD-10-CM | POA: Diagnosis present

## 2023-06-18 DIAGNOSIS — F4310 Post-traumatic stress disorder, unspecified: Secondary | ICD-10-CM | POA: Diagnosis present

## 2023-06-18 DIAGNOSIS — M1990 Unspecified osteoarthritis, unspecified site: Secondary | ICD-10-CM | POA: Diagnosis present

## 2023-06-18 DIAGNOSIS — K5900 Constipation, unspecified: Secondary | ICD-10-CM | POA: Diagnosis present

## 2023-06-18 DIAGNOSIS — Z87442 Personal history of urinary calculi: Secondary | ICD-10-CM | POA: Diagnosis not present

## 2023-06-18 DIAGNOSIS — Z9049 Acquired absence of other specified parts of digestive tract: Secondary | ICD-10-CM | POA: Diagnosis not present

## 2023-06-18 DIAGNOSIS — F322 Major depressive disorder, single episode, severe without psychotic features: Secondary | ICD-10-CM | POA: Insufficient documentation

## 2023-06-18 DIAGNOSIS — Z5941 Food insecurity: Secondary | ICD-10-CM

## 2023-06-18 DIAGNOSIS — Z5900 Homelessness unspecified: Secondary | ICD-10-CM | POA: Diagnosis not present

## 2023-06-18 DIAGNOSIS — I428 Other cardiomyopathies: Secondary | ICD-10-CM | POA: Diagnosis present

## 2023-06-18 DIAGNOSIS — F39 Unspecified mood [affective] disorder: Secondary | ICD-10-CM | POA: Diagnosis present

## 2023-06-18 DIAGNOSIS — F3132 Bipolar disorder, current episode depressed, moderate: Principal | ICD-10-CM | POA: Diagnosis present

## 2023-06-18 DIAGNOSIS — G8929 Other chronic pain: Secondary | ICD-10-CM | POA: Diagnosis present

## 2023-06-18 DIAGNOSIS — I509 Heart failure, unspecified: Secondary | ICD-10-CM | POA: Diagnosis present

## 2023-06-18 DIAGNOSIS — Z8249 Family history of ischemic heart disease and other diseases of the circulatory system: Secondary | ICD-10-CM

## 2023-06-18 DIAGNOSIS — Z79899 Other long term (current) drug therapy: Secondary | ICD-10-CM | POA: Diagnosis not present

## 2023-06-18 DIAGNOSIS — Z96641 Presence of right artificial hip joint: Secondary | ICD-10-CM | POA: Diagnosis present

## 2023-06-18 DIAGNOSIS — M549 Dorsalgia, unspecified: Secondary | ICD-10-CM | POA: Diagnosis present

## 2023-06-18 DIAGNOSIS — R45851 Suicidal ideations: Secondary | ICD-10-CM | POA: Diagnosis present

## 2023-06-18 MED ORDER — DIPHENHYDRAMINE HCL 25 MG PO CAPS
50.0000 mg | ORAL_CAPSULE | Freq: Three times a day (TID) | ORAL | Status: DC | PRN
  Administered 2023-06-18: 50 mg via ORAL
  Filled 2023-06-18: qty 2

## 2023-06-18 MED ORDER — TRAZODONE HCL 50 MG PO TABS
50.0000 mg | ORAL_TABLET | Freq: Every evening | ORAL | Status: DC | PRN
  Administered 2023-06-18 – 2023-06-24 (×5): 50 mg via ORAL
  Filled 2023-06-18 (×5): qty 1

## 2023-06-18 MED ORDER — ACETAMINOPHEN 325 MG PO TABS
650.0000 mg | ORAL_TABLET | Freq: Four times a day (QID) | ORAL | Status: DC | PRN
  Administered 2023-06-18 – 2023-06-24 (×14): 650 mg via ORAL
  Filled 2023-06-18 (×14): qty 2

## 2023-06-18 MED ORDER — MAGNESIUM HYDROXIDE 400 MG/5ML PO SUSP: 30.0000 mL | Freq: Every day | ORAL | Status: DC | PRN

## 2023-06-18 MED ORDER — HALOPERIDOL LACTATE 5 MG/ML IJ SOLN: 5.0000 mg | Freq: Three times a day (TID) | INTRAMUSCULAR | Status: DC | PRN

## 2023-06-18 MED ORDER — LORAZEPAM 1 MG PO TABS
2.0000 mg | ORAL_TABLET | Freq: Three times a day (TID) | ORAL | Status: DC | PRN
  Administered 2023-06-18 – 2023-06-19 (×2): 2 mg via ORAL
  Filled 2023-06-18: qty 1
  Filled 2023-06-18: qty 2

## 2023-06-18 MED ORDER — LORAZEPAM 2 MG/ML IJ SOLN: 2.0000 mg | Freq: Three times a day (TID) | INTRAMUSCULAR | Status: DC | PRN

## 2023-06-18 MED ORDER — ALUM & MAG HYDROXIDE-SIMETH 200-200-20 MG/5ML PO SUSP: 30.0000 mL | ORAL | Status: DC | PRN

## 2023-06-18 MED ORDER — DIPHENHYDRAMINE HCL 50 MG/ML IJ SOLN: 50.0000 mg | Freq: Three times a day (TID) | INTRAMUSCULAR | Status: DC | PRN

## 2023-06-18 MED ORDER — HALOPERIDOL 5 MG PO TABS: 5.0000 mg | ORAL_TABLET | Freq: Three times a day (TID) | ORAL | Status: DC | PRN

## 2023-06-18 MED ORDER — HYDROXYZINE HCL 25 MG PO TABS
25.0000 mg | ORAL_TABLET | Freq: Three times a day (TID) | ORAL | Status: DC | PRN
  Administered 2023-06-19 – 2023-06-25 (×9): 25 mg via ORAL
  Filled 2023-06-18 (×9): qty 1

## 2023-06-18 NOTE — Plan of Care (Signed)
Pt new to the unit tonight, hasn't had time to progress  Problem: Education: Goal: Knowledge of General Education information will improve Description: Including pain rating scale, medication(s)/side effects and non-pharmacologic comfort measures Outcome: Not Progressing   Problem: Health Behavior/Discharge Planning: Goal: Ability to manage health-related needs will improve Outcome: Not Progressing   Problem: Clinical Measurements: Goal: Ability to maintain clinical measurements within normal limits will improve Outcome: Not Progressing Goal: Will remain free from infection Outcome: Not Progressing Goal: Diagnostic test results will improve Outcome: Not Progressing Goal: Respiratory complications will improve Outcome: Not Progressing Goal: Cardiovascular complication will be avoided Outcome: Not Progressing   Problem: Activity: Goal: Risk for activity intolerance will decrease Outcome: Not Progressing   Problem: Nutrition: Goal: Adequate nutrition will be maintained Outcome: Not Progressing   Problem: Coping: Goal: Level of anxiety will decrease Outcome: Not Progressing   Problem: Elimination: Goal: Will not experience complications related to bowel motility Outcome: Not Progressing Goal: Will not experience complications related to urinary retention Outcome: Not Progressing   Problem: Pain Managment: Goal: General experience of comfort will improve Outcome: Not Progressing   Problem: Safety: Goal: Ability to remain free from injury will improve Outcome: Not Progressing   Problem: Skin Integrity: Goal: Risk for impaired skin integrity will decrease Outcome: Not Progressing   Problem: Education: Goal: Knowledge of [place] [facility] Education information/materials will improve Outcome: Not Progressing Goal: Emotional status will improve Outcome: Not Progressing Goal: Mental status will improve Outcome: Not Progressing Goal: Verbalization of understanding  the information provided will improve Outcome: Not Progressing   Problem: Activity: Goal: Interest or engagement in activities will improve Outcome: Not Progressing Goal: Sleeping patterns will improve Outcome: Not Progressing   Problem: Coping: Goal: Ability to verbalize frustrations and anger appropriately will improve Outcome: Not Progressing Goal: Ability to demonstrate self-control will improve Outcome: Not Progressing   Problem: Health Behavior/Discharge Planning: Goal: Identification of resources available to assist in meeting health care needs will improve Outcome: Not Progressing Goal: Compliance with treatment plan for underlying cause of condition will improve Outcome: Not Progressing   Problem: Physical Regulation: Goal: Ability to maintain clinical measurements within normal limits will improve Outcome: Not Progressing   Problem: Safety: Goal: Periods of time without injury will increase Outcome: Not Progressing   Problem: Education: Goal: Utilization of techniques to improve thought processes will improve Outcome: Not Progressing Goal: Knowledge of the prescribed therapeutic regimen will improve Outcome: Not Progressing   Problem: Activity: Goal: Interest or engagement in leisure activities will improve Outcome: Not Progressing Goal: Imbalance in normal sleep/wake cycle will improve Outcome: Not Progressing   Problem: Coping: Goal: Coping ability will improve Outcome: Not Progressing Goal: Will verbalize feelings Outcome: Not Progressing   Problem: Health Behavior/Discharge Planning: Goal: Ability to make decisions will improve Outcome: Not Progressing Goal: Compliance with therapeutic regimen will improve Outcome: Not Progressing   Problem: Role Relationship: Goal: Will demonstrate positive changes in social behaviors and relationships Outcome: Not Progressing   Problem: Education: Goal: Knowledge of [place] [facility] Education  information/materials will improve Outcome: Not Progressing Goal: Emotional status will improve Outcome: Not Progressing Goal: Mental status will improve Outcome: Not Progressing Goal: Verbalization of understanding the information provided will improve Outcome: Not Progressing   Problem: Activity: Goal: Interest or engagement in activities will improve Outcome: Not Progressing Goal: Sleeping patterns will improve Outcome: Not Progressing   Problem: Coping: Goal: Ability to verbalize frustrations and anger appropriately will improve Outcome: Not Progressing Goal: Ability to demonstrate self-control  will improve Outcome: Not Progressing   Problem: Health Behavior/Discharge Planning: Goal: Identification of resources available to assist in meeting health care needs will improve Outcome: Not Progressing Goal: Compliance with treatment plan for underlying cause of condition will improve Outcome: Not Progressing   Problem: Physical Regulation: Goal: Ability to maintain clinical measurements within normal limits will improve Outcome: Not Progressing   Problem: Safety: Goal: Periods of time without injury will increase Outcome: Not Progressing

## 2023-06-18 NOTE — Consult Note (Signed)
  Placed admission orders for IP admission for [facility] as requested by [facility] After chart review in  [facility] system.  The only medication patient is currently prescribed while at [facility] is lisinopril 20 mg daily and Lasix 40 mg daily.  Per Jane chart review.  Patient appears to have been prescribed some psychotropic medications in the past. However, medications have not been verified by pharmacy at this time.

## 2023-06-18 NOTE — Progress Notes (Signed)
Patient admitted from [facility], pt calm and cooperative during assessment. Pt stated that she recently got thrown out of her living situation by her partner. Pt had SI upon coming to the ED but denies it with this Clinical research associate. Pt endorses anxiety and depression, denies SI/HI/AVH. Pt is in pain and states that she needs hip surgery. Pt was given a walker and put close to the nurses station. Pt given education, support, and encouragement to be active in her treatment plan. Pt skin assessment completed with Marylu Lund, RN. Pt has sores on her back, legs and buttocks. Pt is unsure what they are from. Pt has been living in a car but stated that she will have an apartment ready next week. Pt being monitored Q 15 minutes for safety per unit protocol, remains safe on the unit

## 2023-06-18 NOTE — Tx Team (Signed)
Initial Treatment Plan 06/18/2023 8:07 PM Cynthia Richardson [id number]    PATIENT STRESSORS: Health problems   Loss of Relationship     PATIENT STRENGTHS: Average or above average intelligence  Capable of independent living  Communication skills    PATIENT IDENTIFIED PROBLEMS: Major Depressive Disorder  Chronic Pain and Health condition  Loss or Relationship  Housing               DISCHARGE CRITERIA:  Improved stabilization in mood, thinking, and/or behavior  PRELIMINARY DISCHARGE PLAN: Outpatient therapy  PATIENT/FAMILY INVOLVEMENT: This treatment plan has been presented to and reviewed with the patient, Cynthia Richardson, and/or family member, .  The patient and family have been given the opportunity to ask questions and make suggestions.  Shelia Media, RN 06/18/2023, 8:07 PM

## 2023-06-19 DIAGNOSIS — F322 Major depressive disorder, single episode, severe without psychotic features: Secondary | ICD-10-CM | POA: Diagnosis not present

## 2023-06-19 LAB — COMPREHENSIVE METABOLIC PANEL
ALT: 13 U/L (ref 0–44)
AST: 16 U/L (ref 15–41)
Albumin: 3.1 g/dL — ABNORMAL LOW (ref 3.5–5.0)
Alkaline Phosphatase: 138 U/L — ABNORMAL HIGH (ref 38–126)
Anion gap: 8 (ref 5–15)
BUN: 14 mg/dL (ref 6–20)
CO2: 25 mmol/L (ref 22–32)
Calcium: 8.5 mg/dL — ABNORMAL LOW (ref 8.9–10.3)
Chloride: 104 mmol/L (ref 98–111)
Creatinine, Ser: 0.66 mg/dL (ref 0.44–1.00)
GFR, Estimated: >60 mL/min (ref >60)
Glucose, Bld: 128 mg/dL — ABNORMAL HIGH (ref 70–99)
Potassium: 3.1 mmol/L — ABNORMAL LOW (ref 3.5–5.1)
Sodium: 137 mmol/L (ref 135–145)
Total Bilirubin: 0.7 mg/dL (ref 0.3–1.2)
Total Protein: 7.4 g/dL (ref 6.5–8.1)

## 2023-06-19 LAB — CBC WITH DIFFERENTIAL/PLATELET
Abs Immature Granulocytes: 0.02 10*3/uL (ref 0.00–0.07)
Basophils Absolute: 0 10*3/uL (ref 0.0–0.1)
Basophils Relative: 1 %
Eosinophils Absolute: 0.1 10*3/uL (ref 0.0–0.5)
Eosinophils Relative: 2 %
HCT: 36.8 % (ref 36.0–46.0)
Hemoglobin: 12.3 g/dL (ref 12.0–15.0)
Immature Granulocytes: 0 %
Lymphocytes Relative: 31 %
Lymphs Abs: 2.2 10*3/uL (ref 0.7–4.0)
MCH: 29.9 pg (ref 26.0–34.0)
MCHC: 33.4 g/dL (ref 30.0–36.0)
MCV: 89.5 fL (ref 80.0–100.0)
Monocytes Absolute: 0.5 10*3/uL (ref 0.1–1.0)
Monocytes Relative: 6 %
Neutro Abs: 4.3 10*3/uL (ref 1.7–7.7)
Neutrophils Relative %: 60 %
Platelets: 306 10*3/uL (ref 150–400)
RBC: 4.11 MIL/uL (ref 3.87–5.11)
RDW: 13.4 % (ref 11.5–15.5)
WBC: 7.2 10*3/uL (ref 4.0–10.5)
nRBC: 0 % (ref 0.0–0.2)

## 2023-06-19 MED ORDER — IBUPROFEN 200 MG PO TABS
400.0000 mg | ORAL_TABLET | ORAL | Status: DC | PRN
  Administered 2023-06-19 – 2023-06-22 (×9): 400 mg via ORAL
  Filled 2023-06-19 (×9): qty 2

## 2023-06-19 MED ORDER — DULOXETINE HCL 20 MG PO CPEP
20.0000 mg | ORAL_CAPSULE | Freq: Two times a day (BID) | ORAL | Status: DC
  Administered 2023-06-19 – 2023-06-20 (×2): 20 mg via ORAL
  Filled 2023-06-19 (×3): qty 1

## 2023-06-19 MED ORDER — OXCARBAZEPINE 300 MG PO TABS
300.0000 mg | ORAL_TABLET | Freq: Two times a day (BID) | ORAL | Status: DC
  Administered 2023-06-19 – 2023-06-20 (×2): 300 mg via ORAL
  Filled 2023-06-19 (×3): qty 1

## 2023-06-19 NOTE — Group Note (Signed)
Date:  06/19/2023 Time:  8:19 PM  Group Topic/Focus:  Goals Group:   The focus of this group is to help patients establish daily goals to achieve during treatment and discuss how the patient can incorporate goal setting into their daily lives to aide in recovery.    Participation Level:  Did Not Attend  Participation Quality:   Did Not Attend  Affect:   Did Not Attend  Cognitive:  Did Not Attend    Insight: None  Engagement in Group:  None  Modes of Intervention:   Did Not Attend  Additional Comments:    Burt Ek 06/19/2023, 8:19 PM

## 2023-06-19 NOTE — Evaluation (Signed)
Physical Therapy Evaluation Patient Details Name: Cynthia Richardson MRN: [id number] DOB: 02/28/1966 Today's Date: 06/19/2023  History of Present Illness  57 y/o female, states that she recently got thrown out of her living situation (sleeping in a car) by her partner. Pt had SI upon coming to the ED.  Pt endorses anxiety and depression, denies SI/HI/AVH at this time. Pt is in pain and states that she needs hip surgery.  Clinical Impression  Pt sleeping in bed on arrival, was pleasant but due to ongoing severe L hip pain did not wish to do too much.  Pt reports that she was supposed to have had a L hip replacement 2023, but it did not happen - notes that she had both knees replaced ~2018, R hip replaced ~2019 and a lumbar surgery 2021.  Pt was able to move relatively well in bed when not needing to WB through R, but struggled very much with any standing/WBing tasks, noted audible grinding crepitus with L hip WBing - poor tolerance and self selecting using elbows on walker for limited in-room ambulation.  Per subjective history and PT clinical impression there seems to be minimal chance for significant functional improvement w/o surgery, will continue PT at this time to educate on HEP, AD use and functional management techniques.  Pt does not have adequate DME to manage at discharge, will benefit from w/c, BSC and RW at discharge.       If plan is discharge home, recommend the following: A lot of help with walking and/or transfers;A little help with bathing/dressing/bathroom;Assistance with cooking/housework;Assist for transportation;Help with stairs or ramp for entrance (apparently working to procure an apartment)   Can travel by [employer], multimedia (2 wheels);Wheelchair (measurements PT), 3-in-1  Recommendations for Other Services       Functional Status Assessment Patient has had a recent decline in their functional status and demonstrates the ability to  make significant improvements in function in a reasonable and predictable amount of time.     Precautions / Restrictions Precautions Precautions: Fall Restrictions Weight Bearing Restrictions: No      Mobility  Bed Mobility Overal bed mobility: Modified Independent             General bed mobility comments: Pt able to get herself to sitting w/o assist, guarded 2/2 L hip pain    Transfers Overall transfer level: Modified independent Equipment used: Rolling walker (2 wheels) Transfers: Sit to/from Stand Sit to Stand: Supervision           General transfer comment: Pt with appropriate reliance on UEs to push to standing, able to rise from relatively low surface w/o direct assist - reliant on walker with L LE WBing    Ambulation/Gait Ambulation/Gait assistance: Supervision Gait Distance (Feet): 6 Feet Assistive device: Rolling walker (2 wheels)         General Gait Details: Pt self selects leaning elbows on walker to ease L hip WBing, never-the-less audible OA crepitus with L only WBing during R steppage.  Pt clearly in a lot of pain with the effort, was willing to walk more if PT "needed" but had recently walked to the bathroom and back and did not want to aggravate her L hip by doing too much.  Returned to bed after minimal highly UE reliant ambulation.  Stairs            Wheelchair Mobility     Tilt Bed    Modified  Rankin (Stroke Patients Only)       Balance Overall balance assessment: Needs assistance Sitting-balance support: Single extremity supported Sitting balance-Leahy Scale: Good Sitting balance - Comments: pt tending to lean to R to unweight pressure on L hip     Standing balance-Leahy Scale: Good Standing balance comment: Pt had no LOBs but clearly in a lot of pain with any L LE WBing and highly reliant on walker to maintain standing during L WBing                             Pertinent Vitals/Pain Pain Assessment Pain  Assessment: 0-10 Pain Score: 8     Home Living Family/patient expects to be discharged to:: Shelter/Homeless (reports working on an apartment in [place]) Living Arrangements: Alone (aparently living out of partner's car until recently)                 Additional Comments: reports she has a cane but otherwise essentially no possessions    Prior Function Prior Level of Function : Needs assist             Mobility Comments: reports she was supposed to have L total hip replacement in 2023, never happened, and she manages minimal limping ambulation with wooden cane ADLs Comments: reports being able to do what she needs but with increasing pain and struggle     Extremity/Trunk Assessment   Upper Extremity Assessment Upper Extremity Assessment: Overall WFL for tasks assessed    Lower Extremity Assessment Lower Extremity Assessment: Generalized weakness (R LE WFL, pain with any L hip movement, difficult to test)       Communication   Communication Communication: No apparent difficulties  Cognition Arousal: Alert Behavior During Therapy: Restless Overall Cognitive Status: Within Functional Limits for tasks assessed                                 General Comments: appropriate but tearful (pain, etc) during session        General Comments General comments (skin integrity, edema, etc.): Pt was willing to participate with PT but clearly having excessive pain related to the L hip (especially with WBing) and frankly until she consults with an orthopedic (and gets a replacement) she will struggle with a lot of prolonged WBing tasks    Exercises     Assessment/Plan    PT Assessment Patient needs continued PT services (will likely seejust a few sessions to issue/instruct HEP)  PT Problem List Decreased strength;Decreased range of motion;Decreased activity tolerance;Decreased balance;Decreased safety awareness;Decreased knowledge of use of DME;Pain       PT  Treatment Interventions DME instruction;Gait training;Functional mobility training;Therapeutic exercise;Therapeutic activities;Patient/family education    PT Goals (Current goals can be found in the Care Plan section)  Acute Rehab PT Goals Patient Stated Goal: get a hip replacement PT Goal Formulation: With patient Time For Goal Achievement: 07/02/23 Potential to Achieve Goals: Fair    Frequency Min 1X/week     Co-evaluation               AM-PAC PT "6 Clicks" Mobility  Outcome Measure Help needed turning from your back to your side while in a flat bed without using bedrails?: None Help needed moving from lying on your back to sitting on the side of a flat bed without using bedrails?: None Help needed moving to and from a bed  to a chair (including a wheelchair)?: A Little Help needed standing up from a chair using your arms (e.g., wheelchair or bedside chair)?: A Little Help needed to walk in hospital room?: A Little Help needed climbing 3-5 steps with a railing? : A Lot 6 Click Score: 19    End of Session   Activity Tolerance: Patient limited by pain Patient left: in bed Nurse Communication: Mobility status (discussed possible benefits of w/c) PT Visit Diagnosis: Muscle weakness (generalized) (M62.81);Pain;Difficulty in walking, not elsewhere classified (R26.2);Other abnormalities of gait and mobility (R26.89) Pain - Right/Left: Left Pain - part of body: Hip    Time: 1141-1157 PT Time Calculation (min) (ACUTE ONLY): 16 min   Charges:   PT Evaluation $PT Eval Low Complexity: 1 Low   PT General Charges $$ ACUTE PT VISIT: 1 Visit         Malachi Pro, DPT 06/19/2023, 1:46 PM
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
## 2023-06-19 NOTE — Progress Notes (Signed)
Patient transferred from BMU to [facility] via wheelchair. No skin issues noted. Patient grimacing in pain. She was able to stand and pivot from chair to bed and wheelchair to toilet.

## 2023-06-19 NOTE — Group Note (Signed)
Recreation Therapy Group Note   Group Topic:Relaxation  Group Date: 06/19/2023 Start Time: 1030 End Time: 1120 Facilitators: Rosina Lowenstein, LRT, CTRS Location:  Craft Room  Group Description: PMR (Progressive Muscle Relaxation). LRT asks patients their current level of stress/anxiety from 1-10, with 10 being the highest. LRT educates patients on what PMR is and the benefits that come from it. Patients are asked to sit with their feet flat on the floor while sitting up and all the way back in their chair, if possible. LRT and pts follow a prompt through a speaker that requires you to tense and release different muscles in their body and focus on their breathing. During session, lights are off and soft music is being played. At the end of the prompt, LRT asks patients to rank their current levels of stress/anxiety from 1-10, 10 being the highest.   Goal Area(s) Addressed:  Patients will be able to describe progressive muscle relaxation.  Patient will practice using relaxation technique. Patient will identify a new coping skill.  Patient will follow multistep directions to reduce anxiety and stress.   Affect/Mood: N/A   Participation Level: Did not attend    Clinical Observations/Individualized Feedback: Cynthia Richardson did not attend group.  Plan: Continue to engage patient in RT group sessions 2-3x/week.   Rosina Lowenstein, LRT, CTRS 06/19/2023 12:38 PM

## 2023-06-19 NOTE — BH IP Treatment Plan (Signed)
Interdisciplinary Treatment and Diagnostic Plan Update  06/19/2023 Time of Session: 9:22 AM  Cynthia Richardson MRN: [id number]  Principal Diagnosis: MDD (major depressive disorder), severe (HCC)  Secondary Diagnoses: Principal Problem:   MDD (major depressive disorder), severe (HCC)   Current Medications:  Current Facility-Administered Medications  Medication Dose Route Frequency Provider Last Rate Last Admin   acetaminophen (TYLENOL) tablet 650 mg  650 mg Oral Q6H PRN Ardis Hughs, NP   650 mg at 06/19/23 0742   alum & mag hydroxide-simeth (MAALOX/MYLANTA) 200-200-20 MG/5ML suspension 30 mL  30 mL Oral Q4H PRN Ardis Hughs, NP       diphenhydrAMINE (BENADRYL) capsule 50 mg  50 mg Oral TID PRN Ardis Hughs, NP   50 mg at 06/18/23 2101   Or   diphenhydrAMINE (BENADRYL) injection 50 mg  50 mg Intramuscular TID PRN Ardis Hughs, NP       haloperidol (HALDOL) tablet 5 mg  5 mg Oral TID PRN Ardis Hughs, NP       Or   haloperidol lactate (HALDOL) injection 5 mg  5 mg Intramuscular TID PRN Ardis Hughs, NP       hydrOXYzine (ATARAX) tablet 25 mg  25 mg Oral TID PRN Ardis Hughs, NP       LORazepam (ATIVAN) tablet 2 mg  2 mg Oral TID PRN Ardis Hughs, NP   2 mg at 06/18/23 2102   Or   LORazepam (ATIVAN) injection 2 mg  2 mg Intramuscular TID PRN Ardis Hughs, NP       magnesium hydroxide (MILK OF MAGNESIA) suspension 30 mL  30 mL Oral Daily PRN Ardis Hughs, NP       traZODone (DESYREL) tablet 50 mg  50 mg Oral QHS PRN Ardis Hughs, NP   50 mg at 06/18/23 2102   PTA Medications: Medications Prior to Admission  Medication Sig Dispense Refill Last Dose   doxepin (SINEQUAN) 10 MG capsule Take 10 mg by mouth at bedtime.      DULoxetine (CYMBALTA) 60 MG capsule Take 120 mg by mouth daily.       furosemide (LASIX) 40 MG tablet Take 40 mg by mouth 2 (two) times daily.      lisinopril (ZESTRIL) 20 MG tablet Take 20 mg by mouth daily.       oxcarbazepine (TRILEPTAL) 600 MG tablet Take 1,200 mg by mouth daily.       pantoprazole (PROTONIX) 40 MG tablet Take 40 mg by mouth daily.      REXULTI 1 MG TABS tablet Take 1 mg by mouth daily.      SUBOXONE 8-2 MG FILM Place 1 Film under the tongue 3 (three) times daily.       Patient Stressors: Health problems   Loss of Relationship    Patient Strengths: Average or above average intelligence  Capable of independent living  Communication skills   Treatment Modalities: Medication Management, Group therapy, Case management,  1 to 1 session with clinician, Psychoeducation, Recreational therapy.   Physician Treatment Plan for Primary Diagnosis: MDD (major depressive disorder), severe (HCC) Long Term Goal(s):     Short Term Goals:    Medication Management: Evaluate patient's response, side effects, and tolerance of medication regimen.  Therapeutic Interventions: 1 to 1 sessions, Unit Group sessions and Medication administration.  Evaluation of Outcomes: Not Progressing  Physician Treatment Plan for Secondary Diagnosis: Principal Problem:   MDD (major depressive disorder), severe (HCC)  Long  Term Goal(s):     Short Term Goals:       Medication Management: Evaluate patient's response, side effects, and tolerance of medication regimen.  Therapeutic Interventions: 1 to 1 sessions, Unit Group sessions and Medication administration.  Evaluation of Outcomes: Not Progressing   RN Treatment Plan for Primary Diagnosis: MDD (major depressive disorder), severe (HCC) Long Term Goal(s): Knowledge of disease and therapeutic regimen to maintain health will improve  Short Term Goals: Ability to remain free from injury will improve, Ability to verbalize frustration and anger appropriately will improve, Ability to demonstrate self-control, Ability to participate in decision making will improve, Ability to verbalize feelings will improve, Ability to disclose and discuss suicidal ideas, and  Ability to identify and develop effective coping behaviors will improve  Medication Management: RN will administer medications as ordered by provider, will assess and evaluate patient's response and provide education to patient for prescribed medication. RN will report any adverse and/or side effects to prescribing provider.  Therapeutic Interventions: 1 on 1 counseling sessions, Psychoeducation, Medication administration, Evaluate responses to treatment, Monitor vital signs and CBGs as ordered, Perform/monitor CIWA, COWS, AIMS and Fall Risk screenings as ordered, Perform wound care treatments as ordered.  Evaluation of Outcomes: Not Progressing   LCSW Treatment Plan for Primary Diagnosis: MDD (major depressive disorder), severe (HCC) Long Term Goal(s): Safe transition to appropriate next level of care at discharge, Engage patient in therapeutic group addressing interpersonal concerns.  Short Term Goals: Engage patient in aftercare planning with referrals and resources, Increase social support, Increase ability to appropriately verbalize feelings, Increase emotional regulation, Facilitate acceptance of mental health diagnosis and concerns, and Increase skills for wellness and recovery  Therapeutic Interventions: Assess for all discharge needs, 1 to 1 time with Social worker, Explore available resources and support systems, Assess for adequacy in community support network, Educate family and significant other(s) on suicide prevention, Complete Psychosocial Assessment, Interpersonal group therapy.  Evaluation of Outcomes: Not Progressing   Progress in Treatment: Attending groups: No. Participating in groups: No. Taking medication as prescribed: Yes. Toleration medication: Yes. Family/Significant other contact made: No, will contact:  CSW will contact if given permission Patient understands diagnosis: Yes. Discussing patient identified problems/goals with staff: No. Medical problems  stabilized or resolved: Yes. Denies suicidal/homicidal ideation: Yes. Issues/concerns per patient self-inventory: No. Other: None   New problem(s) identified: No, Describe:  None   New Short Term/Long Term Goal(s):  elimination of symptoms of psychosis, medication management for mood stabilization; elimination of SI thoughts; development of comprehensive mental wellness plan.   Patient Goals:  Pt declined treatment team meeting   Discharge Plan or Barriers: CSW will assist with appropriate discharge planning   Reason for Continuation of Hospitalization: Anxiety Depression Medication stabilization  Estimated Length of Stay: 1 to 7 days   Last 3 [place] Suicide Severity Risk Score: Flowsheet Row Admission (Current) from 06/18/2023 in [facility] INPATIENT BEHAVIORAL MEDICINE  C-SSRS RISK CATEGORY Low Risk       Last PHQ 2/9 Scores:     No data to display          Scribe for Treatment Team: Elza Rafter, LCSWA 06/19/2023 10:05 AM

## 2023-06-19 NOTE — Group Note (Signed)
Date:  06/19/2023 Time:  12:13 AM  Group Topic/Focus:  Wrap-Up Group:   The focus of this group is to help patients review their daily goal of treatment and discuss progress on daily workbooks.    Participation Level:  Did Not Attend   Additional Comments:  Not yet on unit    Maglione,Nami Strawder E 06/19/2023, 12:13 AM

## 2023-06-19 NOTE — Plan of Care (Signed)
  Problem: Education: Goal: Knowledge of General Education information will improve Description: Including pain rating scale, medication(s)/side effects and non-pharmacologic comfort measures Outcome: Not Progressing   Problem: Health Behavior/Discharge Planning: Goal: Ability to manage health-related needs will improve Outcome: Not Progressing   Problem: Clinical Measurements: Goal: Ability to maintain clinical measurements within normal limits will improve Outcome: Not Progressing Goal: Will remain free from infection Outcome: Not Progressing Goal: Diagnostic test results will improve Outcome: Not Progressing Goal: Respiratory complications will improve Outcome: Not Progressing Goal: Cardiovascular complication will be avoided Outcome: Not Progressing   Problem: Activity: Goal: Risk for activity intolerance will decrease Outcome: Not Progressing   Problem: Pain Managment: Goal: General experience of comfort will improve Outcome: Not Progressing   Problem: Elimination: Goal: Will not experience complications related to bowel motility Outcome: Not Progressing Goal: Will not experience complications related to urinary retention Outcome: Not Progressing

## 2023-06-19 NOTE — Progress Notes (Signed)
Suicide Risk Assessment  Admission Assessment    [facility] Admission Suicide Risk Assessment   Nursing information obtained from:  Patient Demographic factors:  Low socioeconomic status, Gay, lesbian, or bisexual orientation Current Mental Status:  Suicidal ideation indicated by others Loss Factors:  Loss of significant relationship Historical Factors:  Impulsivity Risk Reduction Factors:  Positive social support  Total Time spent with patient: 1 hour Principal Problem: MDD (major depressive disorder), severe (HCC) Diagnosis:  Principal Problem:   MDD (major depressive disorder), severe (HCC)  Subjective Data: 58 y/o female w/ reported history of bipolar disorder, depression, anxiety, ptsd, admitted as a transfer from [place] ED after presenting with suicidal ideations.  Continued Clinical Symptoms:  Alcohol Use Disorder Identification Test Final Score (AUDIT): 0 The "Alcohol Use Disorders Identification Test", Guidelines for Use in Primary Care, Second Edition.  World Science writer [facility]). Score between 0-7:  no or low risk or alcohol related problems. Score between 8-15:  moderate risk of alcohol related problems. Score between 16-19:  high risk of alcohol related problems. Score 20 or above:  warrants further diagnostic evaluation for alcohol dependence and treatment.   CLINICAL FACTORS:   Previous Psychiatric Diagnoses and Treatments Medical Diagnoses and Treatments/Surgeries   Musculoskeletal: Strength & Muscle Tone:  pt lying down during assessment, ambulates with walker Gait & Station:  pt lying down during assessment, ambulates with walker Patient leans:  pt lying down during assessment, ambulates with walker  Psychiatric Specialty Exam:  Presentation  General Appearance:  Disheveled  Eye Contact: Minimal  Speech: Clear and Coherent; Normal Rate  Speech Volume: Decreased  Handedness: Right   Mood and Affect  Mood: Depressed; Anxious;  Irritable  Affect: Flat   Thought Process  Thought Processes: Coherent; Goal Directed; Linear  Descriptions of Associations:Intact  Orientation:Full (Time, Place and Person)  Thought Content:Logical  History of Schizophrenia/Schizoaffective disorder:No  Hallucinations:Hallucinations: None  Ideas of Reference:None  Suicidal Thoughts:Suicidal Thoughts: Yes, Passive  Homicidal Thoughts:Homicidal Thoughts: No   Sensorium  Memory: Immediate Fair  Judgment: Intact  Insight: Shallow   Executive Functions  Concentration: Fair  Attention Span: Fair  Recall: Fair  Fund of Knowledge: Fair  Language: Fair   Psychomotor Activity  Psychomotor Activity: Psychomotor Activity: Other (comment) (ambulates with a walker)   Assets  Assets: Communication Skills; Desire for Improvement; Financial Resources/Insurance; Resilience   Sleep  Sleep: Sleep: Fair    Physical Exam: Physical Exam see admission ROS see admission Blood pressure (!) 146/90, pulse 68, temperature 98.9 F (37.2 C), resp. rate 20, height 5\' 3"  (1.6 m), weight 118.4 kg, SpO2 100%. Body mass index is 46.24 kg/m.   COGNITIVE FEATURES THAT CONTRIBUTE TO RISK:  Polarized thinking    SUICIDE RISK:   Moderate:  Frequent suicidal ideation with limited intensity, and duration, some specificity in terms of plans, no associated intent, good self-control, limited dysphoria/symptomatology, some risk factors present, and identifiable protective factors, including available and accessible social support.  PLAN OF CARE:  Daily contact with patient to assess and evaluate symptoms and progress in treatment Medication management  I certify that inpatient services furnished can reasonably be expected to improve the patient's condition.   Lauree Chandler, NP 06/19/2023, 12:24 PM

## 2023-06-19 NOTE — Progress Notes (Signed)
Patient transferred to Geropsychiatry from BMU to room 330. PT evaluation with recommendations for assisted transfers and close supervision with ADL's secondary to left hip degeneration. PT state the need for hip replacement. She is A & O x 4 and able to follow commands She was instructed to use the call bell for assist. She complained of chronic pain earlier 8/10 and follow up at 6/10. She states the need to speak with Social Work related to housing. She was tearful and flat. Social Work notified of her request.

## 2023-06-19 NOTE — Group Note (Signed)
Date:  06/19/2023 Time:  10:49 AM  Group Topic/Focus:  Self Esteem Action Plan:   The focus of this group is to help patients create a plan to continue to build self-esteem after discharge.    Participation Level:  Did Not Attend   Cynthia Richardson 06/19/2023, 10:49 AM

## 2023-06-20 DIAGNOSIS — F322 Major depressive disorder, single episode, severe without psychotic features: Secondary | ICD-10-CM | POA: Diagnosis not present

## 2023-06-20 MED ORDER — RISPERIDONE 1 MG PO TABS
0.5000 mg | ORAL_TABLET | ORAL | Status: DC
  Administered 2023-06-20 – 2023-06-25 (×12): 0.5 mg via ORAL
  Filled 2023-06-20 (×12): qty 1

## 2023-06-20 MED ORDER — OXCARBAZEPINE 300 MG PO TABS
600.0000 mg | ORAL_TABLET | Freq: Two times a day (BID) | ORAL | Status: DC
  Administered 2023-06-20 – 2023-06-25 (×10): 600 mg via ORAL
  Filled 2023-06-20 (×12): qty 2

## 2023-06-20 MED ORDER — PANTOPRAZOLE SODIUM 40 MG PO TBEC
40.0000 mg | DELAYED_RELEASE_TABLET | Freq: Every day | ORAL | Status: DC
  Administered 2023-06-20 – 2023-06-25 (×6): 40 mg via ORAL
  Filled 2023-06-20 (×6): qty 1

## 2023-06-20 MED ORDER — LISINOPRIL 20 MG PO TABS
20.0000 mg | ORAL_TABLET | Freq: Every day | ORAL | Status: DC
  Administered 2023-06-20 – 2023-06-24 (×5): 20 mg via ORAL
  Filled 2023-06-20 (×5): qty 1

## 2023-06-20 MED ORDER — DULOXETINE HCL 30 MG PO CPEP
30.0000 mg | ORAL_CAPSULE | Freq: Two times a day (BID) | ORAL | Status: DC
  Administered 2023-06-20 – 2023-06-21 (×2): 30 mg via ORAL
  Filled 2023-06-20 (×2): qty 1

## 2023-06-20 NOTE — Plan of Care (Signed)
D: Pt alert and oriented. Pt endorses depression and anxiety at this time. Pt reports experiencing chronic hip pain at this time, prn medication given. Pt denies experiencing any SI/HI, or AVH at this time.   A: Scheduled medications administered to pt, per MD orders. Support and encouragement provided. Frequent verbal contact made. Routine safety checks conducted q15 minutes.   R: No adverse drug reactions noted. Pt verbally contracts for safety at this time. Pt compliant with medications. Pt self isolates to room. Pt remains safe at this time. Plan of care ongoing.  Pt showered and was observed for shaving chin area. Pt encouraged to attend meals and groups however refuses with the exception of coming out for dinner.   Problem: Coping: Goal: Level of anxiety will decrease Outcome: Not Progressing   Problem: Pain Managment: Goal: General experience of comfort will improve Outcome: Not Progressing

## 2023-06-20 NOTE — Progress Notes (Signed)
Hypertensive overnight. Sys in 180s. Resting in bed without acute issues. PRN ibuprofen given for pain  06/19/23 2200  Psych Admission Type (Psych Patients Only)  Admission Status Voluntary  Psychosocial Assessment  Patient Complaints Anxiety;Depression  Eye Contact Brief  Facial Expression Flat  Affect Depressed  Speech Logical/coherent  Interaction Assertive  Motor Activity Slow  Appearance/Hygiene Disheveled  Behavior Characteristics Anxious  Mood Anxious  Aggressive Behavior  Effect No apparent injury  Thought Process  Coherency WDL  Content Blaming others  Delusions None reported or observed  Perception WDL  Hallucination None reported or observed  Judgment Impaired  Confusion None  Danger to Self  Current suicidal ideation? Denies  Danger to Others  Danger to Others None reported or observed

## 2023-06-20 NOTE — Group Note (Deleted)
Date:  06/20/2023 Time:  9:05 PM  Group Topic/Focus:  Self Care:   The focus of this group is to help patients understand the importance of self-care in order to improve or restore emotional, physical, spiritual, interpersonal, and financial health.     Participation Level:  {BHH PARTICIPATION BPZWC:58527}  Participation Quality:  {BHH PARTICIPATION QUALITY:22265}  Affect:  {BHH AFFECT:22266}  Cognitive:  {BHH COGNITIVE:22267}  Insight: {BHH Insight2:20797}  Engagement in Group:  {BHH ENGAGEMENT IN POEUM:35361}  Modes of Intervention:  {BHH MODES OF INTERVENTION:22269}  Additional Comments:  ***  Burt Ek 06/20/2023, 9:05 PM

## 2023-06-20 NOTE — Group Note (Signed)
Recreation Therapy Group Note   Group Topic:Relaxation  Group Date: 06/20/2023 Start Time: 1400 End Time: 1445 Facilitators: Clinton Gallant, CTRS Location: Day Room  Group Description: Chair Yoga. LRT and patients discussed the benefits of yoga and how it differs from strength exercises. LRT educated patients on the mental and physical benefits of yoga and deep breathing and how it can be used as a Associate Professor. LRT and patients followed along to a guided yoga session on the television that focused on all parts of the body, as well as deep breathing. Pt encouraged to stop movement at any time if they feel discomfort or pain.   Goal Area(s) Addressed: Patient will practice using relaxation technique. Patient will identify a new coping skill.  Patient will follow multistep directions to reduce anxiety and stress.   Affect/Mood: N/A   Participation Level: Did not attend    Clinical Observations/Individualized Feedback: Mendie did not attend group.  Plan: Continue to engage patient in RT group sessions 2-3x/week.   [address], LRT, CTRS 06/20/2023 3:12 PM

## 2023-06-20 NOTE — Group Note (Unsigned)
Date:  06/20/2023 Time:  9:15 PM  Group Topic/Focus:  Self Care:   The focus of this group is to help patients understand the importance of self-care in order to improve or restore emotional, physical, spiritual, interpersonal, and financial health.     Participation Level:  {BHH PARTICIPATION ZOXWR:60454}  Participation Quality:  {BHH PARTICIPATION QUALITY:22265}  Affect:  {BHH AFFECT:22266}  Cognitive:  {BHH COGNITIVE:22267}  Insight: {BHH Insight2:20797}  Engagement in Group:  {BHH ENGAGEMENT IN UJWJX:91478}  Modes of Intervention:  {BHH MODES OF INTERVENTION:22269}  Additional Comments:  ***  Cynthia Richardson 06/20/2023, 9:15 PM

## 2023-06-20 NOTE — Group Note (Signed)
Date:  06/20/2023 Time:  11:22 AM  Group Topic/Focus:  Outside Rec/Music Therapy  The purpose of this group is to get patients outside and get fresh air while listening to therapeutic music.   Participation Level:  Did Not Attend  Participation Quality:    Affect:    Cognitive:    Insight:   Engagement in Group:    Modes of Intervention:   Additional Comments:  Dis not attend  Lysle Yero T Yadhira Mckneely 06/20/2023, 11:22 AM

## 2023-06-20 NOTE — Group Note (Signed)
LCSW Group Therapy Note  Group Date: 06/20/2023 Start Time: 1315 End Time: 1400   Type of Therapy and Topic:  Group Therapy - Healthy vs Unhealthy Coping Skills  Participation Level:  Did Not Attend   Description of Group The focus of this group was to determine what unhealthy coping techniques typically are used by group members and what healthy coping techniques would be helpful in coping with various problems. Patients were guided in becoming aware of the differences between healthy and unhealthy coping techniques. Patients were asked to identify 2-3 healthy coping skills they would like to learn to use more effectively.  Therapeutic Goals Patients learned that coping is what human beings do all day long to deal with various situations in their lives Patients defined and discussed healthy vs unhealthy coping techniques Patients identified their preferred coping techniques and identified whether these were healthy or unhealthy Patients determined 2-3 healthy coping skills they would like to become more familiar with and use more often. Patients provided support and ideas to each other   Summary of Patient Progress: X  Therapeutic Modalities Cognitive Behavioral Therapy Motivational Interviewing  Elza Rafter, [place] 06/20/2023  2:26 PM

## 2023-06-20 NOTE — Progress Notes (Signed)
[facility] MD Progress Note  06/20/2023 10:39 AM Cynthia Richardson  MRN:  [id number] Subjective: Cynthia Richardson is seen on rounds.  She broke up with her girlfriend and they were living in her girlfriend's car and she was kicked out.  She is currently homeless.  She is from [employer] and is supposed to attend a [employer] but has no transportation.  She also states that she needs another hip replacement.  That was done in [place]?  In the meantime we talked about getting her back on some medications.  She states that she was suicidal before she came to the hospital but is not right now. Principal Problem: MDD (major depressive disorder), severe (HCC) Diagnosis: Principal Problem:   MDD (major depressive disorder), severe (HCC)  Total Time spent with patient: 15 minutes  Past Psychiatric History: History of bipolar disorder versus borderline personality disorder and major depressive disorder  Past Medical History:  Past Medical History:  Diagnosis Date   Anxiety    Arthritis    Bipolar disorder (HCC)    Bipolar disorder, current episode depressed, moderate (HCC) 08/16/2014   Bulging lumbar disc    CARDIOMYOPATHY 01/19/2010   Qualifier: Diagnosis of  By: Kem Parkinson     CEREBRAL EDEMA 01/19/2010   Qualifier: Diagnosis of  By: Kem Parkinson     Chest discomfort 08/28/2021   CHF (congestive heart failure) (HCC)    2011   Chronic back pain    Depression    Dyspnea    Essential hypertension 01/13/2018   Headache    History of kidney stones    Hypertension    Hypertensive encephalopathy    Mood disorder (HCC) 01/08/2014   Nonischemic cardiomyopathy (HCC)    stress inducted (Takotsubo type) 12/2009 in the setting of unintentional drug OD with acute respiratory and renal failure; No CAD by 12/2009 cath; EF recovered (> 55% '15, '17)   Opioid dependence (HCC) 01/13/2018   Osteoarthritis of knees, bilateral    Pain in joint, lower leg 02/02/2016   Pain in right hip 02/12/2019   Formatting of this note might  be different from the original. Added automatically from request for surgery [id number]   Pneumonia    Posttraumatic stress disorder 08/16/2014   Primary osteoarthritis of right hip 03/12/2019   PULMONARY EDEMA 01/19/2010   Qualifier: Diagnosis of  By: Kem Parkinson     RENAL FAILURE 01/19/2010   Qualifier: Diagnosis of  By: Kem Parkinson     RESPIRATORY FAILURE 01/19/2010   Qualifier: Diagnosis of  By: Kem Parkinson     S/P total knee replacement 05/13/2017   Seizures (HCC)    Shortness of breath 08/28/2021   Spondylolisthesis of lumbar region 04/16/2018   Swelling 01/14/2014    Past Surgical History:  Procedure Laterality Date   ABDOMINAL HYSTERECTOMY     APPENDECTOMY     CHOLECYSTECTOMY     JOINT REPLACEMENT     TONSILLECTOMY     TOTAL KNEE ARTHROPLASTY Right 05/13/2017   Procedure: RIGHT TOTAL KNEE ARTHROPLASTY;  Surgeon: Dannielle Huh, MD;  Location: [facility] OR;  Service: Orthopedics;  Laterality: Right;   TOTAL KNEE ARTHROPLASTY Left 08/19/2017   Procedure: TOTAL KNEE ARTHROPLASTY;  Surgeon: Dannielle Huh, MD;  Location: [facility] OR;  Service: Orthopedics;  Laterality: Left;   Family History:  Family History  Problem Relation Age of Onset   Emphysema Mother    Hypertension Mother    Liver disease Father    Family Psychiatric  History: Unremarkable Social History:  Social  History   Substance and Sexual Activity  Alcohol Use No     Social History   Substance and Sexual Activity  Drug Use No   Types: Hydrocodone, Hydromorphone, Benzodiazepines, Cocaine   Comment: last used 09/05/2011    Social History   Socioeconomic History   Marital status: Divorced    Spouse name: Not on file   Number of children: Not on file   Years of education: Not on file   Highest education level: Not on file  Occupational History   Not on file  Tobacco Use   Smoking status: Never   Smokeless tobacco: Never  Vaping Use   Vaping status: Never Used  Substance and Sexual Activity   Alcohol  use: No   Drug use: No    Types: Hydrocodone, Hydromorphone, Benzodiazepines, Cocaine    Comment: last used 09/05/2011   Sexual activity: Never  Other Topics Concern   Not on file  Social History Narrative   Not on file   Social Determinants of Health   Financial Resource Strain: At Risk (12/24/2022)   Received from [facility]    Financial Resource Strain: 2  Food Insecurity: Food Insecurity Present (06/18/2023)   Hunger Vital Sign    Worried About Running Out of Food in the Last Year: Sometimes true    Ran Out of Food in the Last Year: Sometimes true  Transportation Needs: No Transportation Needs (06/18/2023)   PRAPARE - Administrator, Civil Service (Medical): No    Lack of Transportation (Non-Medical): No  Physical Activity: At Risk (12/24/2022)   Received from [facility]   Physical Activity    Physical Activity: 2  Stress: Not at Risk (12/24/2022)   Received from [facility]   Stress    Stress: 1  Social Connections: Not at Risk (12/24/2022)   Received from [facility]   Social Connections    Social Connections and Isolation: 1   Additional Social History:                         Sleep: Fair  Appetite:  Fair  Current Medications: Current Facility-Administered Medications  Medication Dose Route Frequency Provider Last Rate Last Admin   acetaminophen (TYLENOL) tablet 650 mg  650 mg Oral Q6H PRN Ardis Hughs, NP   650 mg at 06/20/23 0755   alum & mag hydroxide-simeth (MAALOX/MYLANTA) 200-200-20 MG/5ML suspension 30 mL  30 mL Oral Q4H PRN Ardis Hughs, NP       diphenhydrAMINE (BENADRYL) capsule 50 mg  50 mg Oral TID PRN Ardis Hughs, NP   50 mg at 06/18/23 2101   Or   diphenhydrAMINE (BENADRYL) injection 50 mg  50 mg Intramuscular TID PRN Ardis Hughs, NP       DULoxetine (CYMBALTA) DR capsule 30 mg  30 mg Oral BID Sarina Ill, DO       haloperidol (HALDOL) tablet 5 mg  5 mg Oral TID PRN Ardis Hughs, NP        Or   haloperidol lactate (HALDOL) injection 5 mg  5 mg Intramuscular TID PRN Ardis Hughs, NP       hydrOXYzine (ATARAX) tablet 25 mg  25 mg Oral TID PRN Ardis Hughs, NP   25 mg at 06/19/23 2057   ibuprofen (ADVIL) tablet 400 mg  400 mg Oral Q4H PRN Lauree Chandler, NP   400 mg at 06/20/23 [phone]  lisinopril (ZESTRIL) tablet 20 mg  20 mg Oral QPC breakfast Sarina Ill, DO       LORazepam (ATIVAN) tablet 2 mg  2 mg Oral TID PRN Ardis Hughs, NP   2 mg at 06/19/23 1746   Or   LORazepam (ATIVAN) injection 2 mg  2 mg Intramuscular TID PRN Ardis Hughs, NP       magnesium hydroxide (MILK OF MAGNESIA) suspension 30 mL  30 mL Oral Daily PRN Ardis Hughs, NP       Oxcarbazepine (TRILEPTAL) tablet 600 mg  600 mg Oral BID Sarina Ill, DO       pantoprazole (PROTONIX) EC tablet 40 mg  40 mg Oral Daily Sarina Ill, DO       risperiDONE (RISPERDAL) tablet 0.5 mg  0.5 mg Oral [id number] Zaide Kardell Edward, DO       traZODone (DESYREL) tablet 50 mg  50 mg Oral QHS PRN Ardis Hughs, NP   50 mg at 06/19/23 2057    Lab Results:  Results for orders placed or performed during the hospital encounter of 06/18/23 (from the past 48 hour(s))  CBC with Differential/Platelet     Status: None   Collection Time: 06/19/23  3:14 PM  Result Value Ref Range   WBC 7.2 4.0 - 10.5 K/uL   RBC 4.11 3.87 - 5.11 MIL/uL   Hemoglobin 12.3 12.0 - 15.0 g/dL   HCT 16.1 09.6 - 04.5 %   MCV 89.5 80.0 - 100.0 fL   MCH 29.9 26.0 - 34.0 pg   MCHC 33.4 30.0 - 36.0 g/dL   RDW 40.9 81.1 - 91.4 %   Platelets 306 150 - 400 K/uL   nRBC 0.0 0.0 - 0.2 %   Neutrophils Relative % 60 %   Neutro Abs 4.3 1.7 - 7.7 K/uL   Lymphocytes Relative 31 %   Lymphs Abs 2.2 0.7 - 4.0 K/uL   Monocytes Relative 6 %   Monocytes Absolute 0.5 0.1 - 1.0 K/uL   Eosinophils Relative 2 %   Eosinophils Absolute 0.1 0.0 - 0.5 K/uL   Basophils Relative 1 %   Basophils Absolute  0.0 0.0 - 0.1 K/uL   Immature Granulocytes 0 %   Abs Immature Granulocytes 0.02 0.00 - 0.07 K/uL    Comment: Performed at [facility], [address] [place], [place] [address]  Comprehensive metabolic panel     Status: Abnormal   Collection Time: 06/19/23  3:14 PM  Result Value Ref Range   Sodium 137 135 - 145 mmol/L   Potassium 3.1 (L) 3.5 - 5.1 mmol/L   Chloride 104 98 - 111 mmol/L   CO2 25 22 - 32 mmol/L   Glucose, Bld 128 (H) 70 - 99 mg/dL    Comment: Glucose reference range applies only to samples taken after fasting for at least 8 hours.   BUN 14 6 - 20 mg/dL   Creatinine, Ser 6.21 0.44 - 1.00 mg/dL   Calcium 8.5 (L) 8.9 - 10.3 mg/dL   Total Protein 7.4 6.5 - 8.1 g/dL   Albumin 3.1 (L) 3.5 - 5.0 g/dL   AST 16 15 - 41 U/L   ALT 13 0 - 44 U/L   Alkaline Phosphatase 138 (H) 38 - 126 U/L   Total Bilirubin 0.7 0.3 - 1.2 mg/dL   GFR, Estimated >30 >86 mL/min    Comment: (NOTE) Calculated using the CKD-EPI Creatinine Equation (2021)    Anion gap 8  5 - 15    Comment: Performed at [facility], [address] [place], [place] [address]    Blood Alcohol level:  Lab Results  Component Value Date   [facility] <11 01/07/2014    Metabolic Disorder Labs: No results found for: "HGBA1C", "MPG" No results found for: "PROLACTIN" No results found for: "CHOL", "TRIG", "HDL", "CHOLHDL", "VLDL", "LDLCALC"  Physical Findings: AIMS:  , ,  ,  ,    CIWA:    COWS:     Musculoskeletal: Strength & Muscle Tone: within normal limits Gait & Station: normal Patient leans: N/A  Psychiatric Specialty Exam:  Presentation  General Appearance:  Disheveled  Eye Contact: Minimal  Speech: Clear and Coherent; Normal Rate  Speech Volume: Decreased  Handedness: Right   Mood and Affect  Mood: Depressed; Anxious; Irritable  Affect: Flat   Thought Process  Thought Processes: Coherent; Goal Directed; Linear  Descriptions of  Associations:Intact  Orientation:Full (Time, Place and Person)  Thought Content:Logical  History of Schizophrenia/Schizoaffective disorder:No data recorded Duration of Psychotic Symptoms:No data recorded Hallucinations:Hallucinations: None  Ideas of Reference:None  Suicidal Thoughts:Suicidal Thoughts: Yes, Passive  Homicidal Thoughts:Homicidal Thoughts: No   Sensorium  Memory: Immediate Fair  Judgment: Intact  Insight: Shallow   Executive Functions  Concentration: Fair  Attention Span: Fair  Recall: Fair  Fund of Knowledge: Fair  Language: Fair   Psychomotor Activity  Psychomotor Activity: Psychomotor Activity: Other (comment) (ambulates with a walker)   Assets  Assets: Communication Skills; Desire for Improvement; Financial Resources/Insurance; Resilience   Sleep  Sleep: Sleep: Fair     Blood pressure (!) 152/98, pulse 78, temperature 98.9 F (37.2 C), resp. rate 16, height 5\' 3"  (1.6 m), weight 118.4 kg, SpO2 95%. Body mass index is 46.24 kg/m.   Treatment Plan Summary: Daily contact with patient to assess and evaluate symptoms and progress in treatment, Medication management, and Plan restart some of her home medications.  See orders.  Sarina Ill, DO 06/20/2023, 10:39 AM

## 2023-06-20 NOTE — Group Note (Signed)
Date:  06/20/2023 Time:  4:20 PM  Group Topic/Focus:  Therapeutic exercises that support impactful group sessions.  The cards serve as stand-alone treatment activities to help clients understand therapeutic concepts, build coping skills, and generalize their skills outside of the group.     Participation Level:  Did Not Attend    Cynthia Richardson 06/20/2023, 4:20 PM

## 2023-06-20 NOTE — Progress Notes (Signed)
   06/20/23 2300  Psych Admission Type (Psych Patients Only)  Admission Status Voluntary  Psychosocial Assessment  Patient Complaints Depression;Anxiety  Eye Contact Brief  Facial Expression Flat  Affect Depressed  Speech Soft  Interaction Isolative;Minimal;Needy  Motor Activity Slow  Appearance/Hygiene Disheveled  Behavior Characteristics Appropriate to situation  Mood Depressed  Thought Process  Coherency WDL  Content Blaming others  Delusions None reported or observed  Perception WDL  Hallucination None reported or observed  Judgment Impaired  Confusion None  Danger to Self  Current suicidal ideation? Denies  Danger to Others  Danger to Others None reported or observed

## 2023-06-21 DIAGNOSIS — F322 Major depressive disorder, single episode, severe without psychotic features: Secondary | ICD-10-CM | POA: Diagnosis not present

## 2023-06-21 MED ORDER — MIRTAZAPINE 15 MG PO TABS
15.0000 mg | ORAL_TABLET | Freq: Every day | ORAL | Status: DC
  Administered 2023-06-21 – 2023-06-24 (×4): 15 mg via ORAL
  Filled 2023-06-21 (×4): qty 1

## 2023-06-21 MED ORDER — DULOXETINE HCL 30 MG PO CPEP
30.0000 mg | ORAL_CAPSULE | Freq: Every day | ORAL | Status: DC
  Administered 2023-06-22 – 2023-06-24 (×3): 30 mg via ORAL
  Filled 2023-06-21 (×3): qty 1

## 2023-06-21 NOTE — Group Note (Signed)
Date:  06/21/2023 Time:  10:25 AM  Group Topic/Focus:  Goals Group:   The focus of this group is to help patients establish daily goals to achieve during treatment and discuss how the patient can incorporate goal setting into their daily lives to aide in recovery.    Participation Level:  Did Not Attend   Rodena Goldmann 06/21/2023, 10:25 AM

## 2023-06-21 NOTE — Plan of Care (Signed)
D: Pt alert and oriented. Pt endorses experiencing anxiety/depression at this time. Pt reports experiencing chronic right hip pain at this time, prn medications given. Pt denies experiencing any SI/HI, or AVH at this time.   A: Scheduled medications administered to pt, per MD orders. Support and encouragement provided. Frequent verbal contact made. Routine safety checks conducted q15 minutes.   R: No adverse drug reactions noted. Pt verbally contracts for safety at this time. Pt compliant with medications and treatment plan. Pt interacts minimally with others on the unit. Pt remains safe at this time. Plan of care ongoing.  Pt attended some groups and came to meals.   Problem: Nutrition: Goal: Adequate nutrition will be maintained Outcome: Progressing   Problem: Coping: Goal: Level of anxiety will decrease Outcome: Not Progressing

## 2023-06-21 NOTE — Progress Notes (Signed)
   06/21/23 2126  Psych Admission Type (Psych Patients Only)  Admission Status Voluntary  Psychosocial Assessment  Patient Complaints Anxiety;Worrying  Eye Contact Fair  Facial Expression Anxious;Worried  Affect Anxious  Speech Soft  Interaction Minimal  Motor Activity Slow  Appearance/Hygiene In scrubs  Behavior Characteristics Cooperative;Anxious  Mood Anxious  Thought Process  Coherency WDL  Content WDL  Delusions None reported or observed  Perception WDL  Hallucination None reported or observed  Judgment Impaired  Confusion None  Danger to Self  Current suicidal ideation? Denies

## 2023-06-21 NOTE — Group Note (Signed)
Recreation Therapy Group Note   Group Topic:Emotion Expression  Group Date: 06/21/2023 Start Time: 1400 End Time: 1440 Facilitators: Rosina Lowenstein, LRT, CTRS Location:  Dayroom  Group Description: Positivity Collage. LRT and patients discussed the importance of having a positive mindset and being happy. Patients received magazines, safety scissors, a glue stick and a piece of paper. Pts were encouraged to find images or words in the magazines that showed "happiness" or positivity to them. Pt shared their collage with the group once they were finished. LRT and pts discussed how it can be difficult to always have a positive mindset, especially when they have mental health challenges.    Goal Area(s) Addressed:  Pt will identify things associate with positivity. Pt will reduce negative thinking. Pt will identify a new coping skill of thinking positive thoughts.    Affect/Mood: Appropriate   Participation Level: Active and Engaged   Participation Quality: Independent   Behavior: Appropriate, Calm, and Cooperative   Speech/Thought Process: Coherent   Insight: Good   Judgement: Good   Modes of Intervention: Art and Activity   Patient Response to Interventions:  Attentive, Engaged, Interested , and Receptive   Education Outcome:  Acknowledges education   Clinical Observations/Individualized Feedback: Cynthia Richardson was active in their participation of session activities and group discussion. Pt identified "this picture of a dog reminds me of my dog. I like to go on vacation when I can, I like to do my make up and try new recipes". Pt appropriately identified images to show these. Pt interacted well with LRT and peers duration of session.   Plan: Continue to engage patient in RT group sessions 2-3x/week.   [address], LRT, CTRS 06/21/2023 3:19 PM

## 2023-06-21 NOTE — Plan of Care (Signed)
New goal as of 06/21/23  Problem: Coping Skills Goal: STG - Patient will identify 3 positive coping skills strategies to use for depression post d/c within 5 recreation therapy group sessions Description: STG - Patient will identify 3 positive coping skills strategies to use for depression post d/c within 5 recreation therapy group sessions Outcome: Not Applicable

## 2023-06-21 NOTE — Plan of Care (Signed)
?  Problem: Clinical Measurements: ?Goal: Will remain free from infection ?Outcome: Progressing ?  ?

## 2023-06-21 NOTE — Group Note (Unsigned)
Date:  06/21/2023 Time:  10:28 AM  Group Topic/Focus:  Goals Group:   The focus of this group is to help patients establish daily goals to achieve during treatment and discuss how the patient can incorporate goal setting into their daily lives to aide in recovery.     Participation Level:  {BHH PARTICIPATION ZOXWR:60454}  Participation Quality:  {BHH PARTICIPATION QUALITY:22265}  Affect:  {BHH AFFECT:22266}  Cognitive:  {BHH COGNITIVE:22267}  Insight: {BHH Insight2:20797}  Engagement in Group:  {BHH ENGAGEMENT IN UJWJX:91478}  Modes of Intervention:  {BHH MODES OF INTERVENTION:22269}  Additional Comments:  ***  Rodena Goldmann 06/21/2023, 10:28 AM

## 2023-06-21 NOTE — Progress Notes (Signed)
   06/21/23 0601  15 Minute Checks  Location Bedroom  Visual Appearance Calm  Behavior Sleeping  Sleep (Behavioral Health Patients Only)  Calculate sleep? (Click Yes once per 24 hr at 0600 safety check) Yes  Documented sleep last 24 hours 13

## 2023-06-21 NOTE — Progress Notes (Signed)
[facility] MD Progress Note  06/21/2023 12:26 PM Cynthia Richardson  MRN:  [id number] Subjective: [name] is seen on rounds.  She did not sleep very well last night because the hip that she needs replace is the one that is painful.  She did meet with physical therapy today.  She says that she has a place to live in [place] but needs to call the landlord.  It starts on 1 September.  She still depressed hopeless and helpless.  She has a very flat affect.  I think she could benefit from Remeron at nighttime but might help her sleep and calm her down. Principal Problem: MDD (major depressive disorder), severe (HCC) Diagnosis: Principal Problem:   MDD (major depressive disorder), severe (HCC)  Total Time spent with patient: 15 minutes  Past Psychiatric History: Depression  Past Medical History:  Past Medical History:  Diagnosis Date   Anxiety    Arthritis    Bipolar disorder (HCC)    Bipolar disorder, current episode depressed, moderate (HCC) 08/16/2014   Bulging lumbar disc    CARDIOMYOPATHY 01/19/2010   Qualifier: Diagnosis of  By: Kem Parkinson     CEREBRAL EDEMA 01/19/2010   Qualifier: Diagnosis of  By: Kem Parkinson     Chest discomfort 08/28/2021   CHF (congestive heart failure) (HCC)    2011   Chronic back pain    Depression    Dyspnea    Essential hypertension 01/13/2018   Headache    History of kidney stones    Hypertension    Hypertensive encephalopathy    Mood disorder (HCC) 01/08/2014   Nonischemic cardiomyopathy (HCC)    stress inducted (Takotsubo type) 12/2009 in the setting of unintentional drug OD with acute respiratory and renal failure; No CAD by 12/2009 cath; EF recovered (> 55% '15, '17)   Opioid dependence (HCC) 01/13/2018   Osteoarthritis of knees, bilateral    Pain in joint, lower leg 02/02/2016   Pain in right hip 02/12/2019   Formatting of this note might be different from the original. Added automatically from request for surgery [id number]   Pneumonia    Posttraumatic  stress disorder 08/16/2014   Primary osteoarthritis of right hip 03/12/2019   PULMONARY EDEMA 01/19/2010   Qualifier: Diagnosis of  By: Kem Parkinson     RENAL FAILURE 01/19/2010   Qualifier: Diagnosis of  By: Kem Parkinson     RESPIRATORY FAILURE 01/19/2010   Qualifier: Diagnosis of  By: Kem Parkinson     S/P total knee replacement 05/13/2017   Seizures (HCC)    Shortness of breath 08/28/2021   Spondylolisthesis of lumbar region 04/16/2018   Swelling 01/14/2014    Past Surgical History:  Procedure Laterality Date   ABDOMINAL HYSTERECTOMY     APPENDECTOMY     CHOLECYSTECTOMY     JOINT REPLACEMENT     TONSILLECTOMY     TOTAL KNEE ARTHROPLASTY Right 05/13/2017   Procedure: RIGHT TOTAL KNEE ARTHROPLASTY;  Surgeon: Dannielle Huh, MD;  Location: [facility] OR;  Service: Orthopedics;  Laterality: Right;   TOTAL KNEE ARTHROPLASTY Left 08/19/2017   Procedure: TOTAL KNEE ARTHROPLASTY;  Surgeon: Dannielle Huh, MD;  Location: [facility] OR;  Service: Orthopedics;  Laterality: Left;   Family History:  Family History  Problem Relation Age of Onset   Emphysema Mother    Hypertension Mother    Liver disease Father    Family Psychiatric  History: Unremarkable Social History:  Social History   Substance and Sexual Activity  Alcohol Use No  Social History   Substance and Sexual Activity  Drug Use No   Types: Hydrocodone, Hydromorphone, Benzodiazepines, Cocaine   Comment: last used 09/05/2011    Social History   Socioeconomic History   Marital status: Divorced    Spouse name: Not on file   Number of children: Not on file   Years of education: Not on file   Highest education level: Not on file  Occupational History   Not on file  Tobacco Use   Smoking status: Never   Smokeless tobacco: Never  Vaping Use   Vaping status: Never Used  Substance and Sexual Activity   Alcohol use: No   Drug use: No    Types: Hydrocodone, Hydromorphone, Benzodiazepines, Cocaine    Comment: last used  09/05/2011   Sexual activity: Never  Other Topics Concern   Not on file  Social History Narrative   Not on file   Social Determinants of Health   Financial Resource Strain: At Risk (12/24/2022)   Received from [employer]    Financial Resource Strain: 2  Food Insecurity: Food Insecurity Present (06/18/2023)   Hunger Vital Sign    Worried About Running Out of Food in the Last Year: Sometimes true    Ran Out of Food in the Last Year: Sometimes true  Transportation Needs: No Transportation Needs (06/18/2023)   PRAPARE - Administrator, Civil Service (Medical): No    Lack of Transportation (Non-Medical): No  Physical Activity: At Risk (12/24/2022)   Received from [facility]   Physical Activity    Physical Activity: 2  Stress: Not at Risk (12/24/2022)   Received from [facility]   Stress    Stress: 1  Social Connections: Not at Risk (12/24/2022)   Received from [facility]   Social Connections    Social Connections and Isolation: 1   Additional Social History:                         Sleep: Good  Appetite:  Good  Current Medications: Current Facility-Administered Medications  Medication Dose Route Frequency Provider Last Rate Last Admin   acetaminophen (TYLENOL) tablet 650 mg  650 mg Oral Q6H PRN Ardis Hughs, NP   650 mg at 06/21/23 0827   alum & mag hydroxide-simeth (MAALOX/MYLANTA) 200-200-20 MG/5ML suspension 30 mL  30 mL Oral Q4H PRN Ardis Hughs, NP       diphenhydrAMINE (BENADRYL) capsule 50 mg  50 mg Oral TID PRN Ardis Hughs, NP   50 mg at 06/18/23 2101   Or   diphenhydrAMINE (BENADRYL) injection 50 mg  50 mg Intramuscular TID PRN Ardis Hughs, NP       DULoxetine (CYMBALTA) DR capsule 30 mg  30 mg Oral BID Sarina Ill, DO   30 mg at 06/21/23 0749   haloperidol (HALDOL) tablet 5 mg  5 mg Oral TID PRN Ardis Hughs, NP       Or   haloperidol lactate (HALDOL) injection 5 mg  5 mg Intramuscular TID PRN  Ardis Hughs, NP       hydrOXYzine (ATARAX) tablet 25 mg  25 mg Oral TID PRN Ardis Hughs, NP   25 mg at 06/21/23 0113   ibuprofen (ADVIL) tablet 400 mg  400 mg Oral Q4H PRN Lauree Chandler, NP   400 mg at 06/21/23 0957   lisinopril (ZESTRIL) tablet 20 mg  20 mg Oral QPC breakfast Marlou Porch,  Lanny Cramp, DO   20 mg at 06/21/23 0802   LORazepam (ATIVAN) tablet 2 mg  2 mg Oral TID PRN Ardis Hughs, NP   2 mg at 06/19/23 1746   Or   LORazepam (ATIVAN) injection 2 mg  2 mg Intramuscular TID PRN Ardis Hughs, NP       magnesium hydroxide (MILK OF MAGNESIA) suspension 30 mL  30 mL Oral Daily PRN Ardis Hughs, NP       Oxcarbazepine (TRILEPTAL) tablet 600 mg  600 mg Oral BID Sarina Ill, DO   600 mg at 06/21/23 0951   pantoprazole (PROTONIX) EC tablet 40 mg  40 mg Oral Daily Sarina Ill, DO   40 mg at 06/21/23 1610   risperiDONE (RISPERDAL) tablet 0.5 mg  0.5 mg Oral [id number] Sarina Ill, DO   0.5 mg at 06/21/23 0749   traZODone (DESYREL) tablet 50 mg  50 mg Oral QHS PRN Ardis Hughs, NP   50 mg at 06/20/23 2113    Lab Results:  Results for orders placed or performed during the hospital encounter of 06/18/23 (from the past 48 hour(s))  CBC with Differential/Platelet     Status: None   Collection Time: 06/19/23  3:14 PM  Result Value Ref Range   WBC 7.2 4.0 - 10.5 K/uL   RBC 4.11 3.87 - 5.11 MIL/uL   Hemoglobin 12.3 12.0 - 15.0 g/dL   HCT 96.0 45.4 - 09.8 %   MCV 89.5 80.0 - 100.0 fL   MCH 29.9 26.0 - 34.0 pg   MCHC 33.4 30.0 - 36.0 g/dL   RDW 11.9 14.7 - 82.9 %   Platelets 306 150 - 400 K/uL   nRBC 0.0 0.0 - 0.2 %   Neutrophils Relative % 60 %   Neutro Abs 4.3 1.7 - 7.7 K/uL   Lymphocytes Relative 31 %   Lymphs Abs 2.2 0.7 - 4.0 K/uL   Monocytes Relative 6 %   Monocytes Absolute 0.5 0.1 - 1.0 K/uL   Eosinophils Relative 2 %   Eosinophils Absolute 0.1 0.0 - 0.5 K/uL   Basophils Relative 1 %   Basophils Absolute  0.0 0.0 - 0.1 K/uL   Immature Granulocytes 0 %   Abs Immature Granulocytes 0.02 0.00 - 0.07 K/uL    Comment: Performed at [facility], [address] [place], [place] [address]  Comprehensive metabolic panel     Status: Abnormal   Collection Time: 06/19/23  3:14 PM  Result Value Ref Range   Sodium 137 135 - 145 mmol/L   Potassium 3.1 (L) 3.5 - 5.1 mmol/L   Chloride 104 98 - 111 mmol/L   CO2 25 22 - 32 mmol/L   Glucose, Bld 128 (H) 70 - 99 mg/dL    Comment: Glucose reference range applies only to samples taken after fasting for at least 8 hours.   BUN 14 6 - 20 mg/dL   Creatinine, Ser 0.86 0.44 - 1.00 mg/dL   Calcium 8.5 (L) 8.9 - 10.3 mg/dL   Total Protein 7.4 6.5 - 8.1 g/dL   Albumin 3.1 (L) 3.5 - 5.0 g/dL   AST 16 15 - 41 U/L   ALT 13 0 - 44 U/L   Alkaline Phosphatase 138 (H) 38 - 126 U/L   Total Bilirubin 0.7 0.3 - 1.2 mg/dL   GFR, Estimated >57 >84 mL/min    Comment: (NOTE) Calculated using the CKD-EPI Creatinine Equation (2021)    Anion gap 8  5 - 15    Comment: Performed at [facility], [address] [place], [place] [address]    Blood Alcohol level:  Lab Results  Component Value Date   [facility] <11 01/07/2014    Metabolic Disorder Labs: No results found for: "HGBA1C", "MPG" No results found for: "PROLACTIN" No results found for: "CHOL", "TRIG", "HDL", "CHOLHDL", "VLDL", "LDLCALC"  Physical Findings: AIMS:  , ,  ,  ,    CIWA:    COWS:     Musculoskeletal: Strength & Muscle Tone: within normal limits Gait & Station: normal Patient leans: N/A  Psychiatric Specialty Exam:  Presentation  General Appearance:  Disheveled  Eye Contact: Minimal  Speech: Clear and Coherent; Normal Rate  Speech Volume: Decreased  Handedness: Right   Mood and Affect  Mood: Depressed; Anxious; Irritable  Affect: Flat   Thought Process  Thought Processes: Coherent; Goal Directed; Linear  Descriptions of  Associations:Intact  Orientation:Full (Time, Place and Person)  Thought Content:Logical  History of Schizophrenia/Schizoaffective disorder:No data recorded Duration of Psychotic Symptoms:No data recorded Hallucinations:No data recorded Ideas of Reference:None  Suicidal Thoughts:No data recorded Homicidal Thoughts:No data recorded  Sensorium  Memory: Immediate Fair  Judgment: Intact  Insight: Shallow   Executive Functions  Concentration: Fair  Attention Span: Fair  Recall: Fair  Fund of Knowledge: Fair  Language: Fair   Psychomotor Activity  Psychomotor Activity:No data recorded  Assets  Assets: Communication Skills; Desire for Improvement; Financial Resources/Insurance; Resilience   Sleep  Sleep:No data recorded    Blood pressure 131/70, pulse 66, temperature 98.1 F (36.7 C), resp. rate 18, height 5\' 3"  (1.6 m), weight 118.4 kg, SpO2 97%. Body mass index is 46.24 kg/m.   Treatment Plan Summary: Daily contact with patient to assess and evaluate symptoms and progress in treatment, Medication management, and Plan change Cymbalta to 30 mg in the morning and start Remeron 15 mg at bedtime.  Sarina Ill, DO 06/21/2023, 12:26 PM

## 2023-06-21 NOTE — BHH Counselor (Signed)
CSW called Daine Gip [phone]), pt's future landlord at [facility].   Robin verbalizes understanding and states pt can call her on Monday as she is off the weekend.   CSW will inform pt.

## 2023-06-21 NOTE — Progress Notes (Signed)
   06/21/23 1700  Spiritual Encounters  Type of Visit Initial  Care provided to: Patient  Conversation partners present during encounter Nurse  Referral source Patient request  Reason for visit Routine spiritual support  OnCall Visit Yes  Spiritual Framework  Presenting Themes Meaning/purpose/sources of inspiration;Goals in life/care;Values and beliefs;Impactful experiences and emotions;Coping tools;Courage hope and growth  Community/Connection Limited  Patient Stress Factors Loss;Family relationships;Financial concerns;Health changes  Interventions  Spiritual Care Interventions Made Compassionate presence;Established relationship of care and support;Self-care teaching;Encouragement;Prayer;Reflective listening  Intervention Outcomes  Outcomes Connection to spiritual care;Awareness around self/spiritual resourses;Awareness of health;Reduced anxiety;Patient family open to resources  Spiritual Care Plan  Spiritual Care Issues Still Outstanding No further spiritual care needs at this time (see row info)   Chaplain received a spiritual consult for prayer. Chaplain met with the patient in her room. Patient shared that she is dealing with a heartbreak, financial problems,familial issues, and health issues. Chaplain offered the patient words of hope and encouragement and prayed for the patient. Chaplain will follow up with patient and her progress.
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
## 2023-06-22 DIAGNOSIS — F322 Major depressive disorder, single episode, severe without psychotic features: Secondary | ICD-10-CM | POA: Diagnosis not present

## 2023-06-22 MED ORDER — IBUPROFEN 200 MG PO TABS
600.0000 mg | ORAL_TABLET | Freq: Four times a day (QID) | ORAL | Status: DC | PRN
  Administered 2023-06-22 – 2023-06-25 (×4): 600 mg via ORAL
  Filled 2023-06-22 (×4): qty 3

## 2023-06-22 NOTE — Group Note (Signed)
Date:  06/22/2023 Time:  11:58 PM  Group Topic/Focus:  Wrap-Up Group:   The focus of this group is to help patients review their daily goal of treatment and discuss progress on daily workbooks.    Participation Level:  Active  Participation Quality:  Appropriate  Affect:  Appropriate  Cognitive:  Appropriate  Insight: Appropriate  Engagement in Group:  Engaged  Modes of Intervention:  Discussion  Additional Comments:    Osker Mason 06/22/2023, 11:58 PM

## 2023-06-22 NOTE — Group Note (Signed)
Date:  06/22/2023 Time:  4:23 PM  Group Topic/Focus:  Outside Rec Therapy    Participation Level:  Active  Participation Quality:  Appropriate  Affect:  Appropriate  Cognitive:  Appropriate  Insight: Appropriate  Engagement in Group:  Engaged  Modes of Intervention:  Discussion  Additional Comments:  None  Rodena Goldmann 06/22/2023, 4:23 PM

## 2023-06-22 NOTE — Progress Notes (Signed)
   06/22/23 2200  Psych Admission Type (Psych Patients Only)  Admission Status Voluntary  Psychosocial Assessment  Patient Complaints Anxiety  Eye Contact Fair  Facial Expression Flat  Affect Anxious  Speech Soft  Interaction Minimal  Motor Activity Slow  Appearance/Hygiene In scrubs  Thought Process  Coherency WDL  Content WDL  Delusions None reported or observed  Perception WDL  Hallucination None reported or observed  Judgment Impaired  Confusion None  Danger to Self  Current suicidal ideation? Denies  Danger to Others  Danger to Others None reported or observed

## 2023-06-22 NOTE — Group Note (Unsigned)
Date:  06/22/2023 Time:  4:24 PM  Group Topic/Focus:  Outside Rec Therapy     Participation Level:  {BHH PARTICIPATION ZOXWR:60454}  Participation Quality:  {BHH PARTICIPATION QUALITY:22265}  Affect:  {BHH AFFECT:22266}  Cognitive:  {BHH COGNITIVE:22267}  Insight: {BHH Insight2:20797}  Engagement in Group:  {BHH ENGAGEMENT IN UJWJX:91478}  Modes of Intervention:  {BHH MODES OF INTERVENTION:22269}  Additional Comments:  ***  Rodena Goldmann 06/22/2023, 4:24 PM

## 2023-06-22 NOTE — Progress Notes (Signed)
   06/22/23 0600  15 Minute Checks  Location Bedroom  Visual Appearance Calm  Behavior Sleeping  Sleep (Behavioral Health Patients Only)  Calculate sleep? (Click Yes once per 24 hr at 0600 safety check) Yes  Documented sleep last 24 hours 9.25

## 2023-06-22 NOTE — Group Note (Unsigned)
Date:  06/22/2023 Time:  3:35 PM  Group Topic/Focus:  Movement therapy     Participation Level:  {BHH PARTICIPATION LEVEL:22264}  Participation Quality:  {BHH PARTICIPATION QUALITY:22265}  Affect:  {BHH AFFECT:22266}  Cognitive:  {BHH COGNITIVE:22267}  Insight: {BHH Insight2:20797}  Engagement in Group:  {BHH ENGAGEMENT IN WUJWJ:19147}  Modes of Intervention:  {BHH MODES OF INTERVENTION:22269}  Additional Comments:  ***  Rodena Goldmann 06/22/2023, 3:35 PM

## 2023-06-22 NOTE — Plan of Care (Signed)
°  Problem: Safety: °Goal: Ability to remain free from injury will improve °Outcome: Progressing °  °Problem: Pain Managment: °Goal: General experience of comfort will improve °Outcome: Not Progressing °  °

## 2023-06-22 NOTE — Plan of Care (Signed)
D: Pt alert and oriented. Pt reports experiencing anxiety/depression at this time. Pt reports experiencing chronic hip pain at this time. Pt denies experiencing any SI/HI, or AVH at this time.   A: Scheduled medications administered to pt, per MD orders. Support and encouragement provided. Frequent verbal contact made. Routine safety checks conducted q15 minutes.   R: No adverse drug reactions noted. Pt verbally contracts for safety at this time. Pt compliant with medications and treatment plan. Pt interacts well with others on the unit. Pt remains safe at this time. Plan of care ongoing.  Problem: Nutrition: Goal: Adequate nutrition will be maintained Outcome: Progressing   Problem: Coping: Goal: Level of anxiety will decrease Outcome: Not Progressing

## 2023-06-22 NOTE — Progress Notes (Signed)
[facility] MD Progress Note  06/22/2023 1:08 PM Cynthia Richardson  MRN:  [id number] Subjective: Cynthia Richardson is seen on rounds.  Her biggest complaint is her left hip that needs to be replaced.  She says that she did not sleep very well due to the pain.  She denies any suicidal ideation.  She says that she has spoke with social work about talking with her landlord about her apartment in [place]. Principal Problem: MDD (major depressive disorder), severe (HCC) Diagnosis: Principal Problem:   MDD (major depressive disorder), severe (HCC)  Total Time spent with patient: 15 minutes  Past Psychiatric History: Depression  Past Medical History:  Past Medical History:  Diagnosis Date   Anxiety    Arthritis    Bipolar disorder (HCC)    Bipolar disorder, current episode depressed, moderate (HCC) 08/16/2014   Bulging lumbar disc    CARDIOMYOPATHY 01/19/2010   Qualifier: Diagnosis of  By: Kem Parkinson     CEREBRAL EDEMA 01/19/2010   Qualifier: Diagnosis of  By: Kem Parkinson     Chest discomfort 08/28/2021   CHF (congestive heart failure) (HCC)    2011   Chronic back pain    Depression    Dyspnea    Essential hypertension 01/13/2018   Headache    History of kidney stones    Hypertension    Hypertensive encephalopathy    Mood disorder (HCC) 01/08/2014   Nonischemic cardiomyopathy (HCC)    stress inducted (Takotsubo type) 12/2009 in the setting of unintentional drug OD with acute respiratory and renal failure; No CAD by 12/2009 cath; EF recovered (> 55% '15, '17)   Opioid dependence (HCC) 01/13/2018   Osteoarthritis of knees, bilateral    Pain in joint, lower leg 02/02/2016   Pain in right hip 02/12/2019   Formatting of this note might be different from the original. Added automatically from request for surgery [id number]   Pneumonia    Posttraumatic stress disorder 08/16/2014   Primary osteoarthritis of right hip 03/12/2019   PULMONARY EDEMA 01/19/2010   Qualifier: Diagnosis of  By: Kem Parkinson      RENAL FAILURE 01/19/2010   Qualifier: Diagnosis of  By: Kem Parkinson     RESPIRATORY FAILURE 01/19/2010   Qualifier: Diagnosis of  By: Kem Parkinson     S/P total knee replacement 05/13/2017   Seizures (HCC)    Shortness of breath 08/28/2021   Spondylolisthesis of lumbar region 04/16/2018   Swelling 01/14/2014    Past Surgical History:  Procedure Laterality Date   ABDOMINAL HYSTERECTOMY     APPENDECTOMY     CHOLECYSTECTOMY     JOINT REPLACEMENT     TONSILLECTOMY     TOTAL KNEE ARTHROPLASTY Right 05/13/2017   Procedure: RIGHT TOTAL KNEE ARTHROPLASTY;  Surgeon: Dannielle Huh, MD;  Location: [facility] OR;  Service: Orthopedics;  Laterality: Right;   TOTAL KNEE ARTHROPLASTY Left 08/19/2017   Procedure: TOTAL KNEE ARTHROPLASTY;  Surgeon: Dannielle Huh, MD;  Location: [facility] OR;  Service: Orthopedics;  Laterality: Left;   Family History:  Family History  Problem Relation Age of Onset   Emphysema Mother    Hypertension Mother    Liver disease Father    Family Psychiatric  History: Unremarkable Social History:  Social History   Substance and Sexual Activity  Alcohol Use No     Social History   Substance and Sexual Activity  Drug Use No   Types: Hydrocodone, Hydromorphone, Benzodiazepines, Cocaine   Comment: last used 09/05/2011    Social History  Socioeconomic History   Marital status: Divorced    Spouse name: Not on file   Number of children: Not on file   Years of education: Not on file   Highest education level: Not on file  Occupational History   Not on file  Tobacco Use   Smoking status: Never   Smokeless tobacco: Never  Vaping Use   Vaping status: Never Used  Substance and Sexual Activity   Alcohol use: No   Drug use: No    Types: Hydrocodone, Hydromorphone, Benzodiazepines, Cocaine    Comment: last used 09/05/2011   Sexual activity: Never  Other Topics Concern   Not on file  Social History Narrative   Not on file   Social Determinants of Health   Financial  Resource Strain: At Risk (12/24/2022)   Received from [facility]    Financial Resource Strain: 2  Food Insecurity: Food Insecurity Present (06/18/2023)   Hunger Vital Sign    Worried About Running Out of Food in the Last Year: Sometimes true    Ran Out of Food in the Last Year: Sometimes true  Transportation Needs: No Transportation Needs (06/18/2023)   PRAPARE - Administrator, Civil Service (Medical): No    Lack of Transportation (Non-Medical): No  Physical Activity: At Risk (12/24/2022)   Received from [facility]   Physical Activity    Physical Activity: 2  Stress: Not at Risk (12/24/2022)   Received from [facility]   Stress    Stress: 1  Social Connections: Not at Risk (12/24/2022)   Received from [facility]   Social Connections    Social Connections and Isolation: 1   Additional Social History:                         Sleep: Poor  Appetite:  Fair  Current Medications: Current Facility-Administered Medications  Medication Dose Route Frequency Provider Last Rate Last Admin   acetaminophen (TYLENOL) tablet 650 mg  650 mg Oral Q6H PRN Ardis Hughs, NP   650 mg at 06/22/23 0827   alum & mag hydroxide-simeth (MAALOX/MYLANTA) 200-200-20 MG/5ML suspension 30 mL  30 mL Oral Q4H PRN Ardis Hughs, NP       diphenhydrAMINE (BENADRYL) capsule 50 mg  50 mg Oral TID PRN Ardis Hughs, NP   50 mg at 06/18/23 2101   Or   diphenhydrAMINE (BENADRYL) injection 50 mg  50 mg Intramuscular TID PRN Ardis Hughs, NP       DULoxetine (CYMBALTA) DR capsule 30 mg  30 mg Oral QPC breakfast Sarina Ill, DO   30 mg at 06/22/23 0827   haloperidol (HALDOL) tablet 5 mg  5 mg Oral TID PRN Ardis Hughs, NP       Or   haloperidol lactate (HALDOL) injection 5 mg  5 mg Intramuscular TID PRN Ardis Hughs, NP       hydrOXYzine (ATARAX) tablet 25 mg  25 mg Oral TID PRN Ardis Hughs, NP   25 mg at 06/22/23 0835   ibuprofen (ADVIL) tablet  400 mg  400 mg Oral Q4H PRN Lauree Chandler, NP   400 mg at 06/22/23 1216   lisinopril (ZESTRIL) tablet 20 mg  20 mg Oral QPC breakfast Sarina Ill, DO   20 mg at 06/22/23 0827   LORazepam (ATIVAN) tablet 2 mg  2 mg Oral TID PRN Ardis Hughs, NP   2 mg  at 06/19/23 1746   Or   LORazepam (ATIVAN) injection 2 mg  2 mg Intramuscular TID PRN Ardis Hughs, NP       magnesium hydroxide (MILK OF MAGNESIA) suspension 30 mL  30 mL Oral Daily PRN Ardis Hughs, NP       mirtazapine (REMERON) tablet 15 mg  15 mg Oral QHS Sarina Ill, DO   15 mg at 06/21/23 2115   Oxcarbazepine (TRILEPTAL) tablet 600 mg  600 mg Oral BID Sarina Ill, DO   600 mg at 06/22/23 7829   pantoprazole (PROTONIX) EC tablet 40 mg  40 mg Oral Daily Sarina Ill, DO   40 mg at 06/22/23 0827   risperiDONE (RISPERDAL) tablet 0.5 mg  0.5 mg Oral FA-[id number] Sarina Ill, DO   0.5 mg at 06/22/23 0827   traZODone (DESYREL) tablet 50 mg  50 mg Oral QHS PRN Ardis Hughs, NP   50 mg at 06/20/23 2113    Lab Results: No results found for this or any previous visit (from the past 48 hour(s)).  Blood Alcohol level:  Lab Results  Component Value Date   ETH <11 01/07/2014    Metabolic Disorder Labs: No results found for: "HGBA1C", "MPG" No results found for: "PROLACTIN" No results found for: "CHOL", "TRIG", "HDL", "CHOLHDL", "VLDL", "LDLCALC"  Physical Findings: AIMS:  , ,  ,  ,    CIWA:    COWS:     Musculoskeletal: Strength & Muscle Tone: within normal limits Gait & Station: normal Patient leans: N/A  Psychiatric Specialty Exam:  Presentation  General Appearance:  Disheveled  Eye Contact: Minimal  Speech: Clear and Coherent; Normal Rate  Speech Volume: Decreased  Handedness: Right   Mood and Affect  Mood: Depressed; Anxious; Irritable  Affect: Flat   Thought Process  Thought Processes: Coherent; Goal Directed;  Linear  Descriptions of Associations:Intact  Orientation:Full (Time, Place and Person)  Thought Content:Logical  History of Schizophrenia/Schizoaffective disorder:No data recorded Duration of Psychotic Symptoms:No data recorded Hallucinations:No data recorded Ideas of Reference:None  Suicidal Thoughts:No data recorded Homicidal Thoughts:No data recorded  Sensorium  Memory: Immediate Fair  Judgment: Intact  Insight: Shallow   Executive Functions  Concentration: Fair  Attention Span: Fair  Recall: Fair  Fund of Knowledge: Fair  Language: Fair   Psychomotor Activity  Psychomotor Activity:No data recorded  Assets  Assets: Communication Skills; Desire for Improvement; Financial Resources/Insurance; Resilience   Sleep  Sleep:No data recorded   Blood pressure (!) 151/73, pulse 73, temperature 98.4 F (36.9 C), resp. rate 16, height 5\' 3"  (1.6 m), weight 118.4 kg, SpO2 96%. Body mass index is 46.24 kg/m.   Treatment Plan Summary: Daily contact with patient to assess and evaluate symptoms and progress in treatment, Medication management, and Plan increase Advil.  Yurianna Tusing Tresea Mall, DO 06/22/2023, 1:08 PM

## 2023-06-22 NOTE — Group Note (Signed)
Date:  06/22/2023 Time:  3:31 PM  Group Topic/Focus:  Movement Therapy    Participation Level:  Active  Participation Quality:  Appropriate  Affect:  Appropriate  Cognitive:  Appropriate  Insight: Appropriate  Engagement in Group:  Engaged  Modes of Intervention:  Activity  Additional Comments:  None  Rodena Goldmann 06/22/2023, 3:31 PM

## 2023-06-23 DIAGNOSIS — F322 Major depressive disorder, single episode, severe without psychotic features: Secondary | ICD-10-CM | POA: Diagnosis not present

## 2023-06-23 MED ORDER — ONDANSETRON HCL 4 MG PO TABS
4.0000 mg | ORAL_TABLET | Freq: Four times a day (QID) | ORAL | Status: DC | PRN
  Administered 2023-06-23: 4 mg via ORAL
  Filled 2023-06-23: qty 1

## 2023-06-23 MED ORDER — ONDANSETRON HCL 4 MG PO TABS
4.0000 mg | ORAL_TABLET | Freq: Three times a day (TID) | ORAL | Status: DC | PRN
  Administered 2023-06-23: 4 mg via ORAL
  Filled 2023-06-23: qty 1

## 2023-06-23 NOTE — Group Note (Signed)
LCSW Group Therapy Note   Group Date: 06/23/2023 Start Time: 1315 End Time: 2000   Type of Therapy and Topic:  Group Therapy: Challenging Core Beliefs  Participation Level:  Active  Description of Group:  Patients were educated about core beliefs and asked to identify one harmful core belief that they have. Patients were asked to explore from where those beliefs originate. Patients were asked to discuss how those beliefs make them feel and the resulting behaviors of those beliefs. They were then be asked if those beliefs are true and, if so, what evidence they have to support them. Lastly, group members were challenged to replace those negative core beliefs with helpful beliefs.   Therapeutic Goals:   1. Patient will identify harmful core beliefs and explore the origins of such beliefs. 2. Patient will identify feelings and behaviors that result from those core beliefs. 3. Patient will discuss whether such beliefs are true. 4.  Patient will replace harmful core beliefs with helpful ones.  Summary of Patient Progress:  Cynthia Richardson actively engaged in processing and exploring how core beliefs are formed and how they impact thoughts, feelings, and behaviors. Patient proved open to input from peers and feedback from CSW. Patient demonstrated good insight into the subject matter, was respectful and supportive of peers, and participated throughout the entire session.  Therapeutic Modalities: Cognitive Behavioral Therapy; Solution-Focused Therapy   Elza Rafter, [place] 06/23/2023  2:34 PM

## 2023-06-23 NOTE — Group Note (Signed)
Date:  06/23/2023 Time:  10:06 PM  Group Topic/Focus:  Goals Group:   The focus of this group is to help patients establish daily goals to achieve during treatment and discuss how the patient can incorporate goal setting into their daily lives to aide in recovery.    Participation Level:  Active  Participation Quality:  Appropriate  Affect:  Appropriate  Cognitive:  Appropriate  Insight: Appropriate  Engagement in Group:  Engaged  Modes of Intervention:  Discussion  Additional Comments:    Cynthia Richardson 06/23/2023, 10:06 PM

## 2023-06-23 NOTE — Plan of Care (Signed)
  Problem: Safety: Goal: Ability to remain free from injury will improve Outcome: Progressing   Problem: Coping: Goal: Level of anxiety will decrease Outcome: Not Progressing   Problem: Pain Managment: Goal: General experience of comfort will improve Outcome: Not Progressing   Problem: Education: Goal: Emotional status will improve Outcome: Not Progressing Goal: Mental status will improve Outcome: Not Progressing

## 2023-06-23 NOTE — Progress Notes (Signed)
   06/23/23 2200  Psych Admission Type (Psych Patients Only)  Admission Status Voluntary  Psychosocial Assessment  Patient Complaints Anxiety  Eye Contact Brief  Facial Expression Flat  Affect Anxious  Speech Logical/coherent  Interaction Assertive  Motor Activity Slow  Appearance/Hygiene Unremarkable  Behavior Characteristics Anxious  Mood Anxious  Thought Process  Coherency WDL  Content WDL  Delusions None reported or observed  Perception WDL  Hallucination None reported or observed  Judgment Impaired  Confusion None  Danger to Self  Current suicidal ideation? Denies

## 2023-06-23 NOTE — Plan of Care (Signed)
  Problem: Health Behavior/Discharge Planning: Goal: Ability to manage health-related needs will improve Outcome: Progressing   Problem: Clinical Measurements: Goal: Ability to maintain clinical measurements within normal limits will improve Outcome: Progressing Goal: Will remain free from infection Outcome: Progressing   

## 2023-06-23 NOTE — Progress Notes (Addendum)
[facility] MD Progress Note  06/23/2023 3:49 PM TEOSHA ADI  MRN:  [id number]  Subjective: Case discussed with staff, chart reviewed, patient seen during rounds today.  Patient reports that she came here because of depression.  She reports that she is stressed out about her driving diagnosis.  She said" my partner is locked up and it is a big stressor".  Patient also reports pain in her hip, she reports it is adding to her stress and depression.  Patient was provided with support and reassurance.  Patient feels she is close to her baseline.  She was encouraged to work on a safe discharge plan with Child psychotherapist.  Today patient denies thoughts of harming herself or others.  Patient denies any psychotic symptoms.  She was encouraged to attend group and work on coping strategies.  Patient complains of nausea.  Will prescribe Zofran as needed   Principal Problem: MDD (major depressive disorder), severe (HCC) Diagnosis: Principal Problem:   MDD (major depressive disorder), severe (HCC)   Past Psychiatric History: Depression  Past Medical History:  Past Medical History:  Diagnosis Date   Anxiety    Arthritis    Bipolar disorder (HCC)    Bipolar disorder, current episode depressed, moderate (HCC) 08/16/2014   Bulging lumbar disc    CARDIOMYOPATHY 01/19/2010   Qualifier: Diagnosis of  By: Kem Parkinson     CEREBRAL EDEMA 01/19/2010   Qualifier: Diagnosis of  By: Kem Parkinson     Chest discomfort 08/28/2021   CHF (congestive heart failure) (HCC)    2011   Chronic back pain    Depression    Dyspnea    Essential hypertension 01/13/2018   Headache    History of kidney stones    Hypertension    Hypertensive encephalopathy    Mood disorder (HCC) 01/08/2014   Nonischemic cardiomyopathy (HCC)    stress inducted (Takotsubo type) 12/2009 in the setting of unintentional drug OD with acute respiratory and renal failure; No CAD by 12/2009 cath; EF recovered (> 55% '15, '17)   Opioid dependence (HCC)  01/13/2018   Osteoarthritis of knees, bilateral    Pain in joint, lower leg 02/02/2016   Pain in right hip 02/12/2019   Formatting of this note might be different from the original. Added automatically from request for surgery [id number]   Pneumonia    Posttraumatic stress disorder 08/16/2014   Primary osteoarthritis of right hip 03/12/2019   PULMONARY EDEMA 01/19/2010   Qualifier: Diagnosis of  By: Kem Parkinson     RENAL FAILURE 01/19/2010   Qualifier: Diagnosis of  By: Kem Parkinson     RESPIRATORY FAILURE 01/19/2010   Qualifier: Diagnosis of  By: Kem Parkinson     S/P total knee replacement 05/13/2017   Seizures (HCC)    Shortness of breath 08/28/2021   Spondylolisthesis of lumbar region 04/16/2018   Swelling 01/14/2014    Past Surgical History:  Procedure Laterality Date   ABDOMINAL HYSTERECTOMY     APPENDECTOMY     CHOLECYSTECTOMY     JOINT REPLACEMENT     TONSILLECTOMY     TOTAL KNEE ARTHROPLASTY Right 05/13/2017   Procedure: RIGHT TOTAL KNEE ARTHROPLASTY;  Surgeon: Dannielle Huh, MD;  Location: [facility] OR;  Service: Orthopedics;  Laterality: Right;   TOTAL KNEE ARTHROPLASTY Left 08/19/2017   Procedure: TOTAL KNEE ARTHROPLASTY;  Surgeon: Dannielle Huh, MD;  Location: [facility] OR;  Service: Orthopedics;  Laterality: Left;   Family History:  Family History  Problem Relation Age of Onset  Emphysema Mother    Hypertension Mother    Liver disease Father    Family Psychiatric  History: Unremarkable Social History:  Social History   Substance and Sexual Activity  Alcohol Use No     Social History   Substance and Sexual Activity  Drug Use No   Types: Hydrocodone, Hydromorphone, Benzodiazepines, Cocaine   Comment: last used 09/05/2011    Social History   Socioeconomic History   Marital status: Divorced    Spouse name: Not on file   Number of children: Not on file   Years of education: Not on file   Highest education level: Not on file  Occupational History   Not on file   Tobacco Use   Smoking status: Never   Smokeless tobacco: Never  Vaping Use   Vaping status: Never Used  Substance and Sexual Activity   Alcohol use: No   Drug use: No    Types: Hydrocodone, Hydromorphone, Benzodiazepines, Cocaine    Comment: last used 09/05/2011   Sexual activity: Never  Other Topics Concern   Not on file  Social History Narrative   Not on file   Social Determinants of Health   Financial Resource Strain: At Risk (12/24/2022)   Received from [facility]    Financial Resource Strain: 2  Food Insecurity: Food Insecurity Present (06/18/2023)   Hunger Vital Sign    Worried About Running Out of Food in the Last Year: Sometimes true    Ran Out of Food in the Last Year: Sometimes true  Transportation Needs: No Transportation Needs (06/18/2023)   PRAPARE - Administrator, Civil Service (Medical): No    Lack of Transportation (Non-Medical): No  Physical Activity: At Risk (12/24/2022)   Received from [facility]   Physical Activity    Physical Activity: 2  Stress: Not at Risk (12/24/2022)   Received from [facility]   Stress    Stress: 1  Social Connections: Not at Risk (12/24/2022)   Received from [facility]   Social Connections    Social Connections and Isolation: 1   Additional Social History:                         Sleep:Improved  Appetite:  Fair  Current Medications: Current Facility-Administered Medications  Medication Dose Route Frequency Provider Last Rate Last Admin   acetaminophen (TYLENOL) tablet 650 mg  650 mg Oral Q6H PRN Ardis Hughs, NP   650 mg at 06/23/23 0848   alum & mag hydroxide-simeth (MAALOX/MYLANTA) 200-200-20 MG/5ML suspension 30 mL  30 mL Oral Q4H PRN Ardis Hughs, NP       diphenhydrAMINE (BENADRYL) capsule 50 mg  50 mg Oral TID PRN Ardis Hughs, NP   50 mg at 06/18/23 2101   Or   diphenhydrAMINE (BENADRYL) injection 50 mg  50 mg Intramuscular TID PRN Ardis Hughs, NP       DULoxetine  (CYMBALTA) DR capsule 30 mg  30 mg Oral QPC breakfast Sarina Ill, DO   30 mg at 06/23/23 0848   haloperidol (HALDOL) tablet 5 mg  5 mg Oral TID PRN Ardis Hughs, NP       Or   haloperidol lactate (HALDOL) injection 5 mg  5 mg Intramuscular TID PRN Ardis Hughs, NP       hydrOXYzine (ATARAX) tablet 25 mg  25 mg Oral TID PRN Ardis Hughs, NP   25 mg at  06/23/23 0848   ibuprofen (ADVIL) tablet 600 mg  600 mg Oral Q6H PRN Sarina Ill, DO   600 mg at 06/23/23 1432   lisinopril (ZESTRIL) tablet 20 mg  20 mg Oral QPC breakfast Sarina Ill, DO   20 mg at 06/23/23 0848   LORazepam (ATIVAN) tablet 2 mg  2 mg Oral TID PRN Ardis Hughs, NP   2 mg at 06/19/23 1746   Or   LORazepam (ATIVAN) injection 2 mg  2 mg Intramuscular TID PRN Ardis Hughs, NP       magnesium hydroxide (MILK OF MAGNESIA) suspension 30 mL  30 mL Oral Daily PRN Ardis Hughs, NP       mirtazapine (REMERON) tablet 15 mg  15 mg Oral QHS Sarina Ill, DO   15 mg at 06/22/23 2129   ondansetron (ZOFRAN) tablet 4 mg  4 mg Oral Q8H PRN Lewanda Rife, MD   4 mg at 06/23/23 1432   Oxcarbazepine (TRILEPTAL) tablet 600 mg  600 mg Oral BID Sarina Ill, DO   600 mg at 06/23/23 0848   pantoprazole (PROTONIX) EC tablet 40 mg  40 mg Oral Daily Sarina Ill, DO   40 mg at 06/23/23 0848   risperiDONE (RISPERDAL) tablet 0.5 mg  0.5 mg Oral [id number] Sarina Ill, DO   0.5 mg at 06/23/23 0848   traZODone (DESYREL) tablet 50 mg  50 mg Oral QHS PRN Ardis Hughs, NP   50 mg at 06/22/23 2344    Lab Results: No results found for this or any previous visit (from the past 48 hour(s)).  Blood Alcohol level:  Lab Results  Component Value Date   ETH <11 01/07/2014    Metabolic Disorder Labs: No results found for: "HGBA1C", "MPG" No results found for: "PROLACTIN" No results found for: "CHOL", "TRIG", "HDL", "CHOLHDL", "VLDL",  "LDLCALC"    Musculoskeletal: Strength & Muscle Tone: within normal limits Gait & Station: normal Patient leans: N/A  Psychiatric Specialty Exam:  Presentation  General Appearance:  Appropriate  Eye Contact: Good  Speech: Clear and Coherent; Normal Rate  Speech Volume: Normal  Handedness: Right   Mood and Affect  Mood: "Better"  Affect: Stable, animated   Thought Process  Thought Processes: Coherent; Goal Directed; Linear  Descriptions of Associations:Intact  Orientation:Full (Time, Place and Person)  Thought Content:Logical  History of Schizophrenia/Schizoaffective disorder:No  Hallucinations:Denies Ideas of Reference:None  Suicidal Thoughts:Denies Homicidal Thoughts:Denies  Sensorium  Memory: Immediate Fair  Judgment: Intact  Insight: Improved   Executive Functions  Concentration: Fair  Attention Span: Fair  Recall: [employer] of Knowledge: Fair  Language: Fair   Psychomotor Activity  Psychomotor Activity:Normal  Assets  Assets: Manufacturing systems engineer; Desire for Improvement; Financial Resources/Insurance; Resilience   Sleep  Sleep:Improved   Blood pressure (!) 152/88, pulse 74, temperature 98.6 F (37 C), resp. rate 18, height 5\' 3"  (1.6 m), weight 118.4 kg, SpO2 99%. Body mass index is 46.24 kg/m.   Treatment Plan Summary: Daily contact with patient to assess and evaluate symptoms and progress in treatment and Medication management  Patient has been started on duloxetine, mirtazapine, oxcarbazepine, and Risperdal.  Will continue on current treatment  Prescribed Zoloft from as needed for nausea/vomiting  ELOS 1-2 days  Lewanda Rife, MD

## 2023-06-23 NOTE — Progress Notes (Addendum)
Patient voluntarily admitted on June 18, 2023 from [facility] ED to Behavioral Medicine unit then transferred to [facility] on Aug 28. Patient presented with complaints of anxiety and depression with voicing suicidal thoughts.  She denies SI/HI/AVH. She endorses anxiety but the biggest complaint is chronic L hip pain.  Adm Tylenol 650 mg PRN x 1, Ibuprofen 600 mg PRN X 1. Hydroxyzine 25 mg PRN adm for anxiety. Pain and anxiety medications met with relief. Today is patient's birthday, several calls received.  Patient complains of nausea. New order of Zofran 4 mg Q8H PRN obtained and adm at 1432. Upon follow up, patient was provided relief. At approx 1730, patient stated that she has vomited and is having dry heaves. Declined saltine crackers but accepted ginger ale. HOB elevated. Zofran 4mg  changed to Q6H PRN. At the time of this writing, patient is feeling mildly better.  Patient possible discharge Tuesday/Wednesday, Sept 3/4.  Q15 minute unit checks in place.

## 2023-06-24 DIAGNOSIS — F322 Major depressive disorder, single episode, severe without psychotic features: Secondary | ICD-10-CM | POA: Diagnosis not present

## 2023-06-24 MED ORDER — FUROSEMIDE 20 MG PO TABS
20.0000 mg | ORAL_TABLET | Freq: Once | ORAL | Status: AC
  Administered 2023-06-24: 20 mg via ORAL

## 2023-06-24 MED ORDER — DULOXETINE HCL 60 MG PO CPEP
60.0000 mg | ORAL_CAPSULE | Freq: Every day | ORAL | Status: DC
  Administered 2023-06-25: 60 mg via ORAL
  Filled 2023-06-24: qty 1

## 2023-06-24 MED ORDER — FUROSEMIDE 20 MG PO TABS
40.0000 mg | ORAL_TABLET | Freq: Every day | ORAL | Status: DC
  Administered 2023-06-25: 40 mg via ORAL
  Filled 2023-06-24: qty 2

## 2023-06-24 MED ORDER — LISINOPRIL 20 MG PO TABS
40.0000 mg | ORAL_TABLET | Freq: Every day | ORAL | Status: DC
  Administered 2023-06-25: 40 mg via ORAL
  Filled 2023-06-24: qty 2

## 2023-06-24 MED ORDER — POTASSIUM CHLORIDE CRYS ER 20 MEQ PO TBCR
20.0000 meq | EXTENDED_RELEASE_TABLET | Freq: Every day | ORAL | Status: DC
  Administered 2023-06-24 – 2023-06-25 (×2): 20 meq via ORAL
  Filled 2023-06-24 (×2): qty 1

## 2023-06-24 MED ORDER — TRAZODONE HCL 50 MG PO TABS
150.0000 mg | ORAL_TABLET | Freq: Every evening | ORAL | Status: DC | PRN
  Administered 2023-06-24: 150 mg via ORAL
  Filled 2023-06-24: qty 1

## 2023-06-24 MED ORDER — PRAZOSIN HCL 2 MG PO CAPS
2.0000 mg | ORAL_CAPSULE | Freq: Every day | ORAL | Status: DC
  Administered 2023-06-24: 2 mg via ORAL
  Filled 2023-06-24: qty 1

## 2023-06-24 MED ORDER — HYDROCODONE-ACETAMINOPHEN 7.5-325 MG PO TABS
1.0000 | ORAL_TABLET | Freq: Four times a day (QID) | ORAL | Status: DC | PRN
  Administered 2023-06-24 – 2023-06-25 (×5): 1 via ORAL
  Filled 2023-06-24 (×5): qty 1

## 2023-06-24 MED ORDER — FUROSEMIDE 20 MG PO TABS
20.0000 mg | ORAL_TABLET | Freq: Every day | ORAL | Status: DC
  Administered 2023-06-24: 20 mg via ORAL
  Filled 2023-06-24: qty 1

## 2023-06-24 NOTE — BHH Counselor (Signed)
CSW contacted [facility] [phone]) per pt's request.  CSW unable to reach unable to LVM.   Reynaldo Minium, MSW, [place] 06/24/2023 1:40 PM

## 2023-06-24 NOTE — Progress Notes (Signed)
[facility] MD Progress Note  06/24/2023 11:52 AM Cynthia Richardson  MRN:  [id number] Subjective:  Cynthia Richardson is seen on rounds.  Her biggest complaint is not being able to sleep.  She has been on doxepin in the past and states that it was not helpful.  She prefers trazodone and she is currently on 50 mg.  She will need a higher dose since it is not working.  She also gets nightmares from the trazodone and I told her we could start Minipress.  In the meantime I will put her back on her Lasix and potassium and increase her lisinopril.  She needs to talk with social work to make sure that her apartment is ready.  She denies any suicidal ideation.  She still has depression though. Principal Problem: MDD (major depressive disorder), severe (HCC) Diagnosis: Principal Problem:   MDD (major depressive disorder), severe (HCC)  Total Time spent with patient: 15 minutes  Past Psychiatric History: Depression  Past Medical History:  Past Medical History:  Diagnosis Date   Anxiety    Arthritis    Bipolar disorder (HCC)    Bipolar disorder, current episode depressed, moderate (HCC) 08/16/2014   Bulging lumbar disc    CARDIOMYOPATHY 01/19/2010   Qualifier: Diagnosis of  By: Kem Parkinson     CEREBRAL EDEMA 01/19/2010   Qualifier: Diagnosis of  By: Kem Parkinson     Chest discomfort 08/28/2021   CHF (congestive heart failure) (HCC)    2011   Chronic back pain    Depression    Dyspnea    Essential hypertension 01/13/2018   Headache    History of kidney stones    Hypertension    Hypertensive encephalopathy    Mood disorder (HCC) 01/08/2014   Nonischemic cardiomyopathy (HCC)    stress inducted (Takotsubo type) 12/2009 in the setting of unintentional drug OD with acute respiratory and renal failure; No CAD by 12/2009 cath; EF recovered (> 55% '15, '17)   Opioid dependence (HCC) 01/13/2018   Osteoarthritis of knees, bilateral    Pain in joint, lower leg 02/02/2016   Pain in right hip 02/12/2019   Formatting of  this note might be different from the original. Added automatically from request for surgery [id number]   Pneumonia    Posttraumatic stress disorder 08/16/2014   Primary osteoarthritis of right hip 03/12/2019   PULMONARY EDEMA 01/19/2010   Qualifier: Diagnosis of  By: Kem Parkinson     RENAL FAILURE 01/19/2010   Qualifier: Diagnosis of  By: Kem Parkinson     RESPIRATORY FAILURE 01/19/2010   Qualifier: Diagnosis of  By: Kem Parkinson     S/P total knee replacement 05/13/2017   Seizures (HCC)    Shortness of breath 08/28/2021   Spondylolisthesis of lumbar region 04/16/2018   Swelling 01/14/2014    Past Surgical History:  Procedure Laterality Date   ABDOMINAL HYSTERECTOMY     APPENDECTOMY     CHOLECYSTECTOMY     JOINT REPLACEMENT     TONSILLECTOMY     TOTAL KNEE ARTHROPLASTY Right 05/13/2017   Procedure: RIGHT TOTAL KNEE ARTHROPLASTY;  Surgeon: Dannielle Huh, MD;  Location: [facility] OR;  Service: Orthopedics;  Laterality: Right;   TOTAL KNEE ARTHROPLASTY Left 08/19/2017   Procedure: TOTAL KNEE ARTHROPLASTY;  Surgeon: Dannielle Huh, MD;  Location: [facility] OR;  Service: Orthopedics;  Laterality: Left;   Family History:  Family History  Problem Relation Age of Onset   Emphysema Mother    Hypertension Mother    Liver disease Father  Family Psychiatric  History: Unremarkable Social History:  Social History   Substance and Sexual Activity  Alcohol Use No     Social History   Substance and Sexual Activity  Drug Use No   Types: Hydrocodone, Hydromorphone, Benzodiazepines, Cocaine   Comment: last used 09/05/2011    Social History   Socioeconomic History   Marital status: Divorced    Spouse name: Not on file   Number of children: Not on file   Years of education: Not on file   Highest education level: Not on file  Occupational History   Not on file  Tobacco Use   Smoking status: Never   Smokeless tobacco: Never  Vaping Use   Vaping status: Never Used  Substance and Sexual  Activity   Alcohol use: No   Drug use: No    Types: Hydrocodone, Hydromorphone, Benzodiazepines, Cocaine    Comment: last used 09/05/2011   Sexual activity: Never  Other Topics Concern   Not on file  Social History Narrative   Not on file   Social Determinants of Health   Financial Resource Strain: At Risk (12/24/2022)   Received from [employer] [facility]    Financial Resource Strain: 2  Food Insecurity: Food Insecurity Present (06/18/2023)   Hunger Vital Sign    Worried About Running Out of Food in the Last Year: Sometimes true    Ran Out of Food in the Last Year: Sometimes true  Transportation Needs: No Transportation Needs (06/18/2023)   PRAPARE - Administrator, Civil Service (Medical): No    Lack of Transportation (Non-Medical): No  Physical Activity: At Risk (12/24/2022)   Received from [facility]   Physical Activity    Physical Activity: 2  Stress: Not at Risk (12/24/2022)   Received from [facility]   Stress    Stress: 1  Social Connections: Not at Risk (12/24/2022)   Received from [facility]   Social Connections    Social Connections and Isolation: 1   Additional Social History:                         Sleep: Poor  Appetite:  Fair  Current Medications: Current Facility-Administered Medications  Medication Dose Route Frequency Provider Last Rate Last Admin   acetaminophen (TYLENOL) tablet 650 mg  650 mg Oral Q6H PRN Ardis Hughs, NP   650 mg at 06/24/23 0516   alum & mag hydroxide-simeth (MAALOX/MYLANTA) 200-200-20 MG/5ML suspension 30 mL  30 mL Oral Q4H PRN Ardis Hughs, NP       diphenhydrAMINE (BENADRYL) capsule 50 mg  50 mg Oral TID PRN Ardis Hughs, NP   50 mg at 06/18/23 2101   Or   diphenhydrAMINE (BENADRYL) injection 50 mg  50 mg Intramuscular TID PRN Ardis Hughs, NP       DULoxetine (CYMBALTA) DR capsule 30 mg  30 mg Oral QPC breakfast Sarina Ill, DO   30 mg at 06/24/23 0940   furosemide (LASIX)  tablet 20 mg  20 mg Oral Daily Sarina Ill, DO       haloperidol (HALDOL) tablet 5 mg  5 mg Oral TID PRN Ardis Hughs, NP       Or   haloperidol lactate (HALDOL) injection 5 mg  5 mg Intramuscular TID PRN Ardis Hughs, NP       hydrOXYzine (ATARAX) tablet 25 mg  25 mg Oral TID PRN Ardis Hughs, NP  25 mg at 06/23/23 2052   ibuprofen (ADVIL) tablet 600 mg  600 mg Oral Q6H PRN Sarina Ill, DO   600 mg at 06/23/23 1432   [START ON 06/25/2023] lisinopril (ZESTRIL) tablet 40 mg  40 mg Oral QPC breakfast Sarina Ill, DO       LORazepam (ATIVAN) tablet 2 mg  2 mg Oral TID PRN Ardis Hughs, NP   2 mg at 06/19/23 1746   Or   LORazepam (ATIVAN) injection 2 mg  2 mg Intramuscular TID PRN Ardis Hughs, NP       magnesium hydroxide (MILK OF MAGNESIA) suspension 30 mL  30 mL Oral Daily PRN Ardis Hughs, NP       mirtazapine (REMERON) tablet 15 mg  15 mg Oral QHS Sarina Ill, DO   15 mg at 06/23/23 2054   ondansetron (ZOFRAN) tablet 4 mg  4 mg Oral Q6H PRN Lewanda Rife, MD   4 mg at 06/23/23 2052   Oxcarbazepine (TRILEPTAL) tablet 600 mg  600 mg Oral BID Sarina Ill, DO   600 mg at 06/24/23 0728   pantoprazole (PROTONIX) EC tablet 40 mg  40 mg Oral Daily Sarina Ill, DO   40 mg at 06/24/23 0940   potassium chloride SA (KLOR-CON M) CR tablet 20 mEq  20 mEq Oral Daily Sarina Ill, DO       prazosin (MINIPRESS) capsule 2 mg  2 mg Oral QHS Sarina Ill, DO       risperiDONE (RISPERDAL) tablet 0.5 mg  0.5 mg Oral [id number] Sarina Ill, DO   0.5 mg at 06/24/23 6213   traZODone (DESYREL) tablet 150 mg  150 mg Oral QHS PRN Sarina Ill, DO        Lab Results: No results found for this or any previous visit (from the past 48 hour(s)).  Blood Alcohol level:  Lab Results  Component Value Date   ETH <11 01/07/2014    Metabolic Disorder Labs: No results found  for: "HGBA1C", "MPG" No results found for: "PROLACTIN" No results found for: "CHOL", "TRIG", "HDL", "CHOLHDL", "VLDL", "LDLCALC"  Physical Findings: AIMS:  , ,  ,  ,    CIWA:    COWS:     Musculoskeletal: Strength & Muscle Tone: within normal limits Gait & Station: unable to stand Patient leans: N/A  Psychiatric Specialty Exam:  Presentation  General Appearance:  Disheveled  Eye Contact: Minimal  Speech: Clear and Coherent; Normal Rate  Speech Volume: Decreased  Handedness: Right   Mood and Affect  Mood: Depressed; Anxious; Irritable  Affect: Flat   Thought Process  Thought Processes: Coherent; Goal Directed; Linear  Descriptions of Associations:Intact  Orientation:Full (Time, Place and Person)  Thought Content:Logical  History of Schizophrenia/Schizoaffective disorder:No data recorded Duration of Psychotic Symptoms:No data recorded Hallucinations:No data recorded Ideas of Reference:None  Suicidal Thoughts:No data recorded Homicidal Thoughts:No data recorded  Sensorium  Memory: Immediate Fair  Judgment: Intact  Insight: Shallow   Executive Functions  Concentration: Fair  Attention Span: Fair  Recall: Fair  Fund of Knowledge: Fair  Language: Fair   Psychomotor Activity  Psychomotor Activity:No data recorded  Assets  Assets: Communication Skills; Desire for Improvement; Financial Resources/Insurance; Resilience   Sleep  Sleep:No data recorded    Blood pressure (!) 160/86, pulse 76, temperature 98.4 F (36.9 C), resp. rate 14, height 5\' 3"  (1.6 m), weight 118.4 kg, SpO2 99%. Body mass index is 46.24 kg/m.  Treatment Plan Summary: Daily contact with patient to assess and evaluate symptoms and progress in treatment, Medication management, and Plan restart Lasix, increase lisinopril, restart potassium, increase trazodone and start Minipress.  Sarina Ill, DO 06/24/2023, 11:52 AM

## 2023-06-24 NOTE — Progress Notes (Signed)
Patient is a voluntary admit to [facility] for MDD with c/o anxiety, depression and suicidal thoughts. She is using a wheelchair here at the facility d/t hip pain with future need/plans of hip replacement.  Currently denies S/I, H/I and AVH and current complaint is that of her chronic leg pain.  Dr has added on lasix, potassium and increased her bp meds d/t edema and htn. Possibly to d/c on tues or weds. Will continue to monitor.

## 2023-06-24 NOTE — Progress Notes (Signed)
Nursing Shift Note:  1900-0700  Attended Evening Group: Yes Medication Compliant:  Yes Behavior: anxious, preoccupied Sleep Quality: fair; interrupted by pain episodes Significant Changes: none noted   06/24/23 2200  Psych Admission Type (Psych Patients Only)  Admission Status Voluntary  Psychosocial Assessment  Patient Complaints Anxiety  Eye Contact Brief  Facial Expression Fixed smile  Affect Anxious  Speech Logical/coherent  Interaction Assertive  Motor Activity Slow  Appearance/Hygiene Unremarkable  Behavior Characteristics Anxious  Mood Anxious  Thought Process  Coherency WDL  Content WDL  Delusions None reported or observed  Perception WDL  Hallucination None reported or observed  Judgment Impaired  Confusion None  Danger to Self  Current suicidal ideation? Denies  Danger to Others  Danger to Others None reported or observed

## 2023-06-24 NOTE — BH IP Treatment Plan (Signed)
Interdisciplinary Treatment and Diagnostic Plan Update  06/24/2023 Time of Session: 10:00 AM  Cynthia Richardson MRN: [id number]  Principal Diagnosis: MDD (major depressive disorder), severe (HCC)  Secondary Diagnoses: Principal Problem:   MDD (major depressive disorder), severe (HCC)   Current Medications:  Current Facility-Administered Medications  Medication Dose Route Frequency Provider Last Rate Last Admin   acetaminophen (TYLENOL) tablet 650 mg  650 mg Oral Q6H PRN Ardis Hughs, NP   650 mg at 06/24/23 0516   alum & mag hydroxide-simeth (MAALOX/MYLANTA) 200-200-20 MG/5ML suspension 30 mL  30 mL Oral Q4H PRN Ardis Hughs, NP       diphenhydrAMINE (BENADRYL) capsule 50 mg  50 mg Oral TID PRN Ardis Hughs, NP   50 mg at 06/18/23 2101   Or   diphenhydrAMINE (BENADRYL) injection 50 mg  50 mg Intramuscular TID PRN Ardis Hughs, NP       DULoxetine (CYMBALTA) DR capsule 30 mg  30 mg Oral QPC breakfast Sarina Ill, DO   30 mg at 06/24/23 0940   furosemide (LASIX) tablet 20 mg  20 mg Oral Daily Sarina Ill, DO       haloperidol (HALDOL) tablet 5 mg  5 mg Oral TID PRN Ardis Hughs, NP       Or   haloperidol lactate (HALDOL) injection 5 mg  5 mg Intramuscular TID PRN Ardis Hughs, NP       hydrOXYzine (ATARAX) tablet 25 mg  25 mg Oral TID PRN Ardis Hughs, NP   25 mg at 06/23/23 2052   ibuprofen (ADVIL) tablet 600 mg  600 mg Oral Q6H PRN Sarina Ill, DO   600 mg at 06/23/23 1432   [START ON 06/25/2023] lisinopril (ZESTRIL) tablet 40 mg  40 mg Oral QPC breakfast Sarina Ill, DO       LORazepam (ATIVAN) tablet 2 mg  2 mg Oral TID PRN Ardis Hughs, NP   2 mg at 06/19/23 1746   Or   LORazepam (ATIVAN) injection 2 mg  2 mg Intramuscular TID PRN Ardis Hughs, NP       magnesium hydroxide (MILK OF MAGNESIA) suspension 30 mL  30 mL Oral Daily PRN Ardis Hughs, NP       mirtazapine (REMERON) tablet 15  mg  15 mg Oral QHS Sarina Ill, DO   15 mg at 06/23/23 2054   ondansetron (ZOFRAN) tablet 4 mg  4 mg Oral Q6H PRN Lewanda Rife, MD   4 mg at 06/23/23 2052   Oxcarbazepine (TRILEPTAL) tablet 600 mg  600 mg Oral BID Sarina Ill, DO   600 mg at 06/24/23 0728   pantoprazole (PROTONIX) EC tablet 40 mg  40 mg Oral Daily Sarina Ill, DO   40 mg at 06/24/23 0940   potassium chloride SA (KLOR-CON M) CR tablet 20 mEq  20 mEq Oral Daily Sarina Ill, DO       prazosin (MINIPRESS) capsule 2 mg  2 mg Oral QHS Sarina Ill, DO       risperiDONE (RISPERDAL) tablet 0.5 mg  0.5 mg Oral [id number] Sarina Ill, DO   0.5 mg at 06/24/23 0729   traZODone (DESYREL) tablet 150 mg  150 mg Oral QHS PRN Sarina Ill, DO       PTA Medications: Medications Prior to Admission  Medication Sig Dispense Refill Last Dose   doxepin (SINEQUAN) 10 MG capsule Take 10 mg by  mouth at bedtime.      DULoxetine (CYMBALTA) 60 MG capsule Take 120 mg by mouth daily.       furosemide (LASIX) 40 MG tablet Take 40 mg by mouth 2 (two) times daily.      lisinopril (ZESTRIL) 20 MG tablet Take 20 mg by mouth daily.      oxcarbazepine (TRILEPTAL) 600 MG tablet Take 1,200 mg by mouth daily.       pantoprazole (PROTONIX) 40 MG tablet Take 40 mg by mouth daily.      REXULTI 1 MG TABS tablet Take 1 mg by mouth daily.      SUBOXONE 8-2 MG FILM Place 1 Film under the tongue 3 (three) times daily.       Patient Stressors: Health problems   Loss of Relationship    Patient Strengths: Average or above average intelligence  Capable of independent living  Communication skills   Treatment Modalities: Medication Management, Group therapy, Case management,  1 to 1 session with clinician, Psychoeducation, Recreational therapy.   Physician Treatment Plan for Primary Diagnosis: MDD (major depressive disorder), severe (HCC) Long Term Goal(s): Improvement in symptoms so as  ready for discharge   Short Term Goals: Ability to identify changes in lifestyle to reduce recurrence of condition will improve Ability to verbalize feelings will improve Ability to disclose and discuss suicidal ideas Ability to identify and develop effective coping behaviors will improve Ability to identify triggers associated with substance abuse/mental health issues will improve  Medication Management: Evaluate patient's response, side effects, and tolerance of medication regimen.  Therapeutic Interventions: 1 to 1 sessions, Unit Group sessions and Medication administration.  Evaluation of Outcomes: Progressing  Physician Treatment Plan for Secondary Diagnosis: Principal Problem:   MDD (major depressive disorder), severe (HCC)  Long Term Goal(s): Improvement in symptoms so as ready for discharge   Short Term Goals: Ability to identify changes in lifestyle to reduce recurrence of condition will improve Ability to verbalize feelings will improve Ability to disclose and discuss suicidal ideas Ability to identify and develop effective coping behaviors will improve Ability to identify triggers associated with substance abuse/mental health issues will improve     Medication Management: Evaluate patient's response, side effects, and tolerance of medication regimen.  Therapeutic Interventions: 1 to 1 sessions, Unit Group sessions and Medication administration.  Evaluation of Outcomes: Progressing   RN Treatment Plan for Primary Diagnosis: MDD (major depressive disorder), severe (HCC) Long Term Goal(s): Knowledge of disease and therapeutic regimen to maintain health will improve  Short Term Goals: Ability to remain free from injury will improve, Ability to verbalize frustration and anger appropriately will improve, Ability to demonstrate self-control, Ability to participate in decision making will improve, Ability to verbalize feelings will improve, Ability to disclose and discuss  suicidal ideas, Ability to identify and develop effective coping behaviors will improve, and Compliance with prescribed medications will improve  Medication Management: RN will administer medications as ordered by provider, will assess and evaluate patient's response and provide education to patient for prescribed medication. RN will report any adverse and/or side effects to prescribing provider.  Therapeutic Interventions: 1 on 1 counseling sessions, Psychoeducation, Medication administration, Evaluate responses to treatment, Monitor vital signs and CBGs as ordered, Perform/monitor CIWA, COWS, AIMS and Fall Risk screenings as ordered, Perform wound care treatments as ordered.  Evaluation of Outcomes: Progressing   LCSW Treatment Plan for Primary Diagnosis: MDD (major depressive disorder), severe (HCC) Long Term Goal(s): Safe transition to appropriate next level of care at discharge,  Engage patient in therapeutic group addressing interpersonal concerns.  Short Term Goals: Engage patient in aftercare planning with referrals and resources, Increase social support, Increase ability to appropriately verbalize feelings, Increase emotional regulation, Facilitate acceptance of mental health diagnosis and concerns, and Increase skills for wellness and recovery  Therapeutic Interventions: Assess for all discharge needs, 1 to 1 time with Social worker, Explore available resources and support systems, Assess for adequacy in community support network, Educate family and significant other(s) on suicide prevention, Complete Psychosocial Assessment, Interpersonal group therapy.  Evaluation of Outcomes: Progressing   Progress in Treatment: Attending groups: Yes. Participating in groups: Yes. Taking medication as prescribed: Yes. Toleration medication: Yes. Family/Significant other contact made: No, will contact:  CSW will contact if given permission Patient understands diagnosis: Yes. Discussing patient  identified problems/goals with staff: Yes. Medical problems stabilized or resolved: Yes. Denies suicidal/homicidal ideation: Yes. Issues/concerns per patient self-inventory: No. Other: None   New problem(s) identified: No, Describe:  None Update 06/24/23: No changes at this time    New Short Term/Long Term Goal(s):  elimination of symptoms of psychosis, medication management for mood stabilization; elimination of SI thoughts; development of comprehensive mental wellness plan. Update 06/24/23: No changes at this time      Patient Goals:  Pt declined treatment team meeting Update 06/24/23 Pt is engaging with staff and states she wants to find a place to live    Discharge Plan or Barriers: CSW will assist with appropriate discharge planning Update 06/24/23: No changes at this time      Reason for Continuation of Hospitalization: Anxiety Depression Medication stabilization   Estimated Length of Stay: 1 to 7 days Update 06/24/23: No changes at this time     Last 3 [place] Suicide Severity Risk Score: Flowsheet Row Admission (Current) from 06/18/2023 in [facility]  C-SSRS RISK CATEGORY Low Risk       Last PHQ 2/9 Scores:     No data to display          Scribe for Treatment Team: Elza Rafter, Theresia Majors 06/24/2023 12:05 PM

## 2023-06-24 NOTE — Group Note (Signed)
Date:  06/24/2023 Time:  11:24 PM  Group Topic/Focus:  Developing a Wellness Toolbox:   The focus of this group is to help patients develop a "wellness toolbox" with skills and strategies to promote recovery upon discharge.    Participation Level:  Active  Participation Quality:  Appropriate  Affect:  Appropriate  Cognitive:  Appropriate  Insight: Appropriate  Engagement in Group:  Engaged  Modes of Intervention:  Discussion  Additional Comments:    Maeola Harman 06/24/2023, 11:24 PM

## 2023-06-24 NOTE — BHH Counselor (Signed)
Pt asked to speak with CSW.   Pt states she is in extreme pain in her lower legs. She stated she feels she needs medical attention and would like a full work-up from a hospitalist. She said her lymphedema is causing pain in her lower legs which is causing her to not be able to transfer herself out of her wheelchair to use the restroom or get in bed.\  CSW informed provider.   Provider states that he is starting pt on lasix, increased pt's BP medicine and started her on pain medicine.   Provider states pain is caused by need for hip replacement.   Reynaldo Minium, MSW, [place] 06/24/2023 1:46 PM

## 2023-06-25 DIAGNOSIS — F322 Major depressive disorder, single episode, severe without psychotic features: Secondary | ICD-10-CM | POA: Diagnosis not present

## 2023-06-25 MED ORDER — HYDROXYZINE HCL 25 MG PO TABS: 25.0000 mg | ORAL_TABLET | Freq: Three times a day (TID) | ORAL | 0 refills | Status: AC | PRN

## 2023-06-25 MED ORDER — ONDANSETRON HCL 4 MG PO TABS: 4.0000 mg | ORAL_TABLET | Freq: Four times a day (QID) | ORAL | 0 refills | Status: AC | PRN

## 2023-06-25 MED ORDER — DULOXETINE HCL 60 MG PO CPEP: 60.0000 mg | ORAL_CAPSULE | Freq: Every day | ORAL | 0 refills | Status: AC

## 2023-06-25 MED ORDER — MIRTAZAPINE 15 MG PO TABS: 15.0000 mg | ORAL_TABLET | Freq: Every day | ORAL | 0 refills | Status: AC

## 2023-06-25 MED ORDER — FUROSEMIDE 40 MG PO TABS: 40.0000 mg | ORAL_TABLET | Freq: Every day | ORAL | 0 refills | Status: AC

## 2023-06-25 MED ORDER — TRAZODONE HCL 150 MG PO TABS: 150.0000 mg | ORAL_TABLET | Freq: Every evening | ORAL | 0 refills | Status: AC | PRN

## 2023-06-25 MED ORDER — POTASSIUM CHLORIDE CRYS ER 20 MEQ PO TBCR: 20.0000 meq | EXTENDED_RELEASE_TABLET | Freq: Every day | ORAL | 0 refills | Status: AC

## 2023-06-25 MED ORDER — HYDROCODONE-ACETAMINOPHEN 7.5-325 MG PO TABS: 1.0000 | ORAL_TABLET | Freq: Four times a day (QID) | ORAL | 0 refills | Status: AC | PRN

## 2023-06-25 MED ORDER — LISINOPRIL 40 MG PO TABS: 40.0000 mg | ORAL_TABLET | Freq: Every day | ORAL | 0 refills | Status: AC

## 2023-06-25 MED ORDER — IBUPROFEN 600 MG PO TABS: 600.0000 mg | ORAL_TABLET | Freq: Four times a day (QID) | ORAL | 0 refills | Status: AC | PRN

## 2023-06-25 MED ORDER — PANTOPRAZOLE SODIUM 40 MG PO TBEC: 40.0000 mg | DELAYED_RELEASE_TABLET | Freq: Every day | ORAL | 0 refills | Status: AC

## 2023-06-25 MED ORDER — OXCARBAZEPINE 600 MG PO TABS: 600.0000 mg | ORAL_TABLET | Freq: Two times a day (BID) | ORAL | 0 refills | Status: AC

## 2023-06-25 MED ORDER — PRAZOSIN HCL 2 MG PO CAPS: 2.0000 mg | ORAL_CAPSULE | Freq: Every day | ORAL | 0 refills | Status: AC

## 2023-06-25 MED ORDER — RISPERIDONE 0.5 MG PO TABS: 0.5000 mg | ORAL_TABLET | ORAL | 0 refills | Status: AC

## 2023-06-25 NOTE — Group Note (Signed)
LCSW Group Therapy Note  Group Date: 06/25/2023 Start Time: 1330 End Time: 1400   Type of Therapy and Topic:  Group Therapy - Healthy vs Unhealthy Coping Skills  Participation Level:  Active   Description of Group The focus of this group was to determine what unhealthy coping techniques typically are used by group members and what healthy coping techniques would be helpful in coping with various problems. Patients were guided in becoming aware of the differences between healthy and unhealthy coping techniques. Patients were asked to identify 2-3 healthy coping skills they would like to learn to use more effectively.  Therapeutic Goals Patients learned that coping is what human beings do all day long to deal with various situations in their lives Patients defined and discussed healthy vs unhealthy coping techniques Patients identified their preferred coping techniques and identified whether these were healthy or unhealthy Patients determined 2-3 healthy coping skills they would like to become more familiar with and use more often. Patients provided support and ideas to each other   Summary of Patient Progress:  Patient proved open to input from peers and feedback from CSW. Patient demonstrated good insight into the subject matter, was respectful of peers, and participated throughout the entire session.   Therapeutic Modalities Cognitive Behavioral Therapy Motivational Interviewing  Cynthia Richardson, [place] 06/25/2023  2:01 PM

## 2023-06-25 NOTE — BHH Suicide Risk Assessment (Signed)
[facility] Discharge Suicide Risk Assessment   Principal Problem: <principal problem not specified> Discharge Diagnoses: Active Problems:   Bipolar disorder, current episode depressed, moderate (HCC)    Musculoskeletal: Strength & Muscle Tone: within normal limits Gait & Station: normal Patient leans: N/A   Psychiatric Specialty Exam:   Presentation  General Appearance:  Appropriate   Eye Contact: Good   Speech: Clear and Coherent; Normal Rate   Speech Volume: Normal   Handedness: Right     Mood and Affect  Mood: "Better"   Affect: Stable, animated     Thought Processes: Coherent; Goal Directed; Linear   Descriptions of Associations:Intact   Orientation:Full (Time, Place and Person)   Thought Content:Logical   History of Schizophrenia/Schizoaffective disorder:No   Hallucinations:Denies Ideas of Reference:None   Suicidal Thoughts:Denies, no intentions or plans Homicidal Thoughts:Denies, no intentions or plans   Sensorium  Memory: Immediate Fair   Judgment: Intact   Insight: Improved     Executive Functions  Concentration: Fair   Attention Span: Fair   Recall: [employer] of Knowledge: Fair   Language: Fair     Psychomotor Activity:Normal   Assets  Assets: Manufacturing systems engineer; Desire for Improvement; Financial Resources/Insurance; Resilience     Sleep  Sleep:Improved   Blood pressure (!) 146/76, pulse 67, temperature 98.4 F (36.9 C), resp. rate 14, height 5\' 3"  (1.6 m), weight 118.4 kg, SpO2 98%. Body mass index is 46.24 kg/m.  Demographic Factors:  Caucasian and Living alone  Loss Factors: Decline in physical health and Financial problems/change in socioeconomic status  Historical Factors: Prior suicide attempts and Impulsivity  Risk Reduction Factors:   Positive therapeutic relationship and Positive coping skills or problem solving skills  Continued Clinical Symptoms:  Previous Psychiatric Diagnoses and  Treatments  Cognitive Features That Contribute To Risk:  Polarized thinking    Suicide Risk:  Minimal: No identifiable suicidal ideation.    Follow-up Information     [employer]. Go to.   Why: To complete your hospital follow-up you must go to [facility]. The location closest to you does not do scheduled appointments for hospital follow-up but you can go anytime M-F 8:00 AM- 5:00 PM. Please remember to bring your insurance card. Contact information: [address] [place] [address] [phone]                 Plan Of Care/Follow-up recommendations:  Per Discharge Summary  Lewanda Rife, MD

## 2023-06-25 NOTE — Care Management Important Message (Signed)
Important Message  Patient Details  Name: Cynthia Richardson MRN: [id number] Date of Birth: July 23, 1966   Medicare Important Message Given:  Yes     Laretta Alstrom 06/25/2023, 10:36 AM

## 2023-06-25 NOTE — Discharge Summary (Signed)
Physician Discharge Summary Note  Patient:  Cynthia Richardson is an 57 y.o., female MRN:  [id number] DOB:  May 22, 1966 Patient phone:  [phone] (home)  Patient address:   [address] [place] [address],    Date of Admission:  06/18/2023 Date of Discharge: 06/25/2023  Reason for Admission:  57 y/o female w/ reported history of bipolar disorder, depression, anxiety, ptsd, admitted as a transfer from [facility] after presenting with suicidal ideations.   Principal Problem: Bipolar disorder most current episode depressed Discharge Diagnoses: Active Problems:   Bipolar disorder, current episode depressed, moderate (HCC)   Past Psychiatric History: Reported history of bipolar disorder, depression, anxiety, ptsd; per chart review, history of alcohol, cocaine, opioid use disorder   Past Medical History:  Past Medical History:  Diagnosis Date   Anxiety    Arthritis    Bipolar disorder (HCC)    Bipolar disorder, current episode depressed, moderate (HCC) 08/16/2014   Bulging lumbar disc    CARDIOMYOPATHY 01/19/2010   Qualifier: Diagnosis of  By: Kem Parkinson     CEREBRAL EDEMA 01/19/2010   Qualifier: Diagnosis of  By: Kem Parkinson     Chest discomfort 08/28/2021   CHF (congestive heart failure) (HCC)    2011   Chronic back pain    Depression    Dyspnea    Essential hypertension 01/13/2018   Headache    History of kidney stones    Hypertension    Hypertensive encephalopathy    Mood disorder (HCC) 01/08/2014   Nonischemic cardiomyopathy (HCC)    stress inducted (Takotsubo type) 12/2009 in the setting of unintentional drug OD with acute respiratory and renal failure; No CAD by 12/2009 cath; EF recovered (> 55% '15, '17)   Opioid dependence (HCC) 01/13/2018   Osteoarthritis of knees, bilateral    Pain in joint, lower leg 02/02/2016   Pain in right hip 02/12/2019   Formatting of this note might be different from the original. Added automatically from request for surgery  [id number]   Pneumonia    Posttraumatic stress disorder 08/16/2014   Primary osteoarthritis of right hip 03/12/2019   PULMONARY EDEMA 01/19/2010   Qualifier: Diagnosis of  By: Kem Parkinson     RENAL FAILURE 01/19/2010   Qualifier: Diagnosis of  By: Kem Parkinson     RESPIRATORY FAILURE 01/19/2010   Qualifier: Diagnosis of  By: Kem Parkinson     S/P total knee replacement 05/13/2017   Seizures (HCC)    Shortness of breath 08/28/2021   Spondylolisthesis of lumbar region 04/16/2018   Swelling 01/14/2014    Past Surgical History:  Procedure Laterality Date   ABDOMINAL HYSTERECTOMY     APPENDECTOMY     CHOLECYSTECTOMY     JOINT REPLACEMENT     TONSILLECTOMY     TOTAL KNEE ARTHROPLASTY Right 05/13/2017   Procedure: RIGHT TOTAL KNEE ARTHROPLASTY;  Surgeon: Dannielle Huh, MD;  Location: [facility] OR;  Service: Orthopedics;  Laterality: Right;   TOTAL KNEE ARTHROPLASTY Left 08/19/2017   Procedure: TOTAL KNEE ARTHROPLASTY;  Surgeon: Dannielle Huh, MD;  Location: [facility] OR;  Service: Orthopedics;  Laterality: Left;   Family History:  Family History  Problem Relation Age of Onset   Emphysema Mother    Hypertension Mother    Liver disease Father     Social History:  Social History   Substance and Sexual Activity  Alcohol Use No     Social History   Substance and Sexual Activity  Drug Use No   Types:  Hydrocodone, Hydromorphone, Benzodiazepines, Cocaine   Comment: last used 09/05/2011    Social History   Socioeconomic History   Marital status: Divorced    Spouse name: Not on file   Number of children: Not on file   Years of education: Not on file   Highest education level: Not on file  Occupational History   Not on file  Tobacco Use   Smoking status: Never   Smokeless tobacco: Never  Vaping Use   Vaping status: Never Used  Substance and Sexual Activity   Alcohol use: No   Drug use: No    Types: Hydrocodone, Hydromorphone, Benzodiazepines, Cocaine    Comment: last used  09/05/2011   Sexual activity: Never  Other Topics Concern   Not on file  Social History Narrative   Not on file   Social Determinants of Health   Financial Resource Strain: At Risk (12/24/2022)   Received from [employer] [facility]    Financial Resource Strain: 2  Food Insecurity: Food Insecurity Present (06/18/2023)   Hunger Vital Sign    Worried About Running Out of Food in the Last Year: Sometimes true    Ran Out of Food in the Last Year: Sometimes true  Transportation Needs: No Transportation Needs (06/18/2023)   PRAPARE - Administrator, Civil Service (Medical): No    Lack of Transportation (Non-Medical): No  Physical Activity: At Risk (12/24/2022)   Received from [facility]   Physical Activity    Physical Activity: 2  Stress: Not at Risk (12/24/2022)   Received from [facility]   Stress    Stress: 1  Social Connections: Not at Risk (12/24/2022)   [facility]   Social Connections    Social Connections and Isolation: 1    Hospital Course:  The patient was admitted to Inpatient psychiatric treatment for stabilization of depression and suicidal ideations.  Patient was placed on suicidal precautions. The patient was evaluated and treated by the multidisciplinary treatment team including physicians, nurses, social workers and therapists. All medications were presented to the patient and the Patient gave consent to all the medications that they were given, as well as was explained the risks, benefits, side effects and alternatives of all medication therapies. The patient was integrated into the general milieu on the ward and encouraged to attend to her ADLs and participate in all groups and activities. During hospital course the Patient attended coping skill groups, music therapy and activity therapy groups. Patient was counseled on cognitive techniques/skills by multiple staff members and given support care by the staff.  Patient's medication regimen was evaluated and  titrated to therapeutic levels to better patient was evaluated and followed by Dr. Marlou Porch.  She was started on Cymbalta, mirtazapine, carbamazepine, and Risperdal.  She tolerated medicines without side affects.  On the day of discharge patient denied thoughts of harming herself or others.  Patient reported" I feel like myself after a long time".  Patient thanked Retail banker for all the help the hospital has provided her.  She reports groups were helpful.  Patient was encouraged to follow-up with primary care physician to determine continue use of potassium and pain medicines.  She agreed to do so.  Side effect of medicine including metabolic syndrome discussed with the patient.  During the hospitalization, the patient demonstrated a stabilization of mood with decreased racing thoughts, decreased impulsivity, improved sleep and decreased irritability. At the time of discharge, the patient denied any suicidal ideation/homicidal ideation and was not  overtly depressed, manic or psychotic. The Patient was interacting well in groups and on the unit with their peers. Patient was able to identify a safety plan to include speaking with family, contacting outpatient provider or calling 911 if hallucinations/delusions returned or worsened or thoughts of self-harm or suicide return. Patient was counselled on outpatient follow-up that was arranged prior to discharge.  Musculoskeletal: Strength & Muscle Tone: within normal limits Gait & Station: normal Patient leans: N/A   Psychiatric Specialty Exam:   Presentation  General Appearance:  Appropriate   Eye Contact: Good   Speech: Clear and Coherent; Normal Rate   Speech Volume: Normal   Handedness: Right     Mood and Affect  Mood: "Better"   Affect: Stable, animated     Thought Processes: Coherent; Goal Directed; Linear   Descriptions of Associations:Intact   Orientation:Full (Time, Place and Person)   Thought Content:Logical   History  of Schizophrenia/Schizoaffective disorder:No   Hallucinations:Denies Ideas of Reference:None   Suicidal Thoughts:Denies, no intentions or plans Homicidal Thoughts:Denies, no intentions or plans   Sensorium  Memory: Immediate Fair   Judgment: Intact   Insight: Improved     Executive Functions  Concentration: Fair   Attention Span: Fair   Recall: [employer] of Knowledge: Fair   Language: Fair     Psychomotor Activity:Normal   Assets  Assets: Manufacturing systems engineer; Desire for Improvement; Financial Resources/Insurance; Resilience     Sleep  Sleep:Improved  Blood pressure (!) 146/76, pulse 67, temperature 98.4 F (36.9 C), resp. rate 14, height 5\' 3"  (1.6 m), weight 118.4 kg, SpO2 98%. Body mass index is 46.24 kg/m.   Social History   Tobacco Use  Smoking Status Never  Smokeless Tobacco Never   Tobacco Cessation:  N/A, patient does not currently use tobacco products   Blood Alcohol level:  Lab Results  Component Value Date   ETH <11 01/07/2014    Metabolic Disorder Labs:  No results found for: "HGBA1C", "MPG" No results found for: "PROLACTIN" No results found for: "CHOL", "TRIG", "HDL", "CHOLHDL", "VLDL", "LDLCALC"  See Psychiatric Specialty Exam and Suicide Risk Assessment completed by Attending Physician prior to discharge.  Discharge destination:  Home  Recommended Plan for Multiple Antipsychotic Therapies: NA   Allergies as of 06/25/2023   No Known Allergies      Medication List     STOP taking these medications    doxepin 10 MG capsule Commonly known as: SINEQUAN   Rexulti 1 MG Tabs tablet Generic drug: brexpiprazole   Suboxone 8-2 MG Film Generic drug: Buprenorphine HCl-Naloxone HCl       TAKE these medications      Indication  DULoxetine 60 MG capsule Commonly known as: CYMBALTA Take 1 capsule (60 mg total) by mouth daily after breakfast. Start taking on: June 26, 2023 What changed:  how much to  take when to take this    furosemide 40 MG tablet Commonly known as: LASIX Take 1 tablet (40 mg total) by mouth daily. Start taking on: June 26, 2023 What changed: when to take this    HYDROcodone-acetaminophen 7.5-325 MG tablet Commonly known as: NORCO Take 1 tablet by mouth every 6 (six) hours as needed for severe pain.    hydrOXYzine 25 MG tablet Commonly known as: ATARAX Take 1 tablet (25 mg total) by mouth 3 (three) times daily as needed for anxiety.    ibuprofen 600 MG tablet Commonly known as: ADVIL Take 1 tablet (600 mg total) by mouth every 6 (  six) hours as needed for fever, headache, mild pain or moderate pain.    lisinopril 40 MG tablet Commonly known as: ZESTRIL Take 1 tablet (40 mg total) by mouth daily after breakfast. Start taking on: June 26, 2023 What changed:  medication strength how much to take when to take this    mirtazapine 15 MG tablet Commonly known as: REMERON Take 1 tablet (15 mg total) by mouth at bedtime.    ondansetron 4 MG tablet Commonly known as: ZOFRAN Take 1 tablet (4 mg total) by mouth every 6 (six) hours as needed for nausea or vomiting.    oxcarbazepine 600 MG tablet Commonly known as: TRILEPTAL Take 1 tablet (600 mg total) by mouth 2 (two) times daily. What changed:  how much to take when to take this    pantoprazole 40 MG tablet Commonly known as: PROTONIX Take 1 tablet (40 mg total) by mouth daily.    potassium chloride SA 20 MEQ tablet Commonly known as: KLOR-CON M Take 1 tablet (20 mEq total) by mouth daily. Start taking on: June 26, 2023    prazosin 2 MG capsule Commonly known as: MINIPRESS Take 1 capsule (2 mg total) by mouth at bedtime.    risperiDONE 0.5 MG tablet Commonly known as: RISPERDAL Take 1 tablet (0.5 mg total) by mouth 2 (two) times daily at 8 am and 4 pm.    traZODone 150 MG tablet Commonly known as: DESYREL Take 1 tablet (150 mg total) by mouth at bedtime as needed for sleep.          Follow-up Information     Inc, [employer]. Go to.   Why: To complete your hospital follow-up you must go to [facility]. The location closest to you does not do scheduled appointments for hospital follow-up but you can go anytime M-F 8:00 AM- 5:00 PM. Please remember to bring your insurance card. Contact information: [address] [place] [address] [phone]                PATIENTS CONDITION AT DISCHARGE:  Stable  TOBACCO CESSATION SCREENING  Patient was screened and counselled on smoking cessation at time of discharge.   PRESCRIPTION ARE LOCATED:  On Chart  DISCHARGE INSTRUCTIONS:  1. Diet.  Cardiac  2. Activity: As tolerated  3. Take medications as prescribed and not to make any changes without first consulting with the outpatient provider.  4. Patient was advised to avoid any illicit drugs or alcohol due to negative impact on physical and mental health.  5. Patient should keep all follow up appointments.  TIME SPENT ON DISCHARGE: Over 35 minutes were spent on this patients discharge including a face to face encounter, patient counseling and preparation of discharge materials.   Signed: Lewanda Rife, MD

## 2023-06-25 NOTE — [facility] Suicide Risk Assessment (Signed)
[facility] INPATIENT:  Family/Significant Other Suicide Prevention Education  Suicide Prevention Education:  Patient Refusal for Family/Significant Other Suicide Prevention Education: The patient Cynthia Richardson has refused to provide written consent for family/significant other to be provided Family/Significant Other Suicide Prevention Education during admission and/or prior to discharge.  Physician notified.  Elza Rafter 06/25/2023, 10:37 AM

## 2023-06-25 NOTE — Plan of Care (Signed)
Pt escorted to the exit with belongings and paperwork to awaiting transport.

## 2023-06-25 NOTE — B[facility] Counselor (Signed)
CSW contacted [facility] [phone]) per pt request.   Audie Pinto at [facility] in [place] states that pt will have an apartment open for her today with water, lights, a TV, sofa and a bed.   Pt confirms that she would like to be discharged to her new apartment.   CSW will inform provider   Reynaldo Minium, MSW, [facility] 06/25/2023 10:23 AM

## 2023-06-25 NOTE — Progress Notes (Signed)
Patient is awaiting transport to her new apartment. Safe transport is expected in the next 30 mins. Requested pain medication and given PRN vicodin.  Per patient she has missing jeans and t-shirt that is not listed on her clothing list. Multiple people searched both Gero unit and BMU unit (patient was admitted to [facility] first) with no success, Confirmed with [facility] ER and they had documented she was sent her to [facility] in her personal clothes. Patient is aware and given clothes to wear home. SW made aware as well.

## 2023-06-25 NOTE — Group Note (Unsigned)
Date:  06/25/2023 Time:  9:14 AM  Group Topic/Focus:  Making Healthy Choices:   The focus of this group is to help patients identify negative/unhealthy choices they were using prior to admission and identify positive/healthier coping strategies to replace them upon discharge.     Participation Level:  {B[facility] PARTICIPATION ZOXWR:60454}  Participation Quality:  {B[facility] PARTICIPATION QUALITY:22265}  Affect:  {BHH AFFECT:22266}  Cognitive:  {B[facility] COGNITIVE:22267}  Insight: {BHH Insight2:20797}  Engagement in Group:  {BHH ENGAGEMENT IN UJWJX:91478}  Modes of Intervention:  {B[facility] MODES OF INTERVENTION:22269}  Additional Comments:  ***  Cynthia Richardson 06/25/2023, 9:14 AM

## 2023-06-25 NOTE — Group Note (Signed)
Date:  06/25/2023 Time:  9:12 AM  Group Topic/Focus:  Making Healthy Choices:   The focus of this group is to help patients identify negative/unhealthy choices they were using prior to admission and identify positive/healthier coping strategies to replace them upon discharge.    Participation Level:  Did Not Attend    Rodena Goldmann 06/25/2023, 9:12 AM

## 2023-06-25 NOTE — Group Note (Signed)
Recreation Therapy Group Note   Group Topic:General Recreation  Group Date: 06/25/2023 Start Time: 1400 End Time: 1450 Facilitators: Rosina Lowenstein, LRT, CTRS Location: Courtyard  Group Description: Outdoor Recreation. Patients had the option to play ring toss, corn hole or sit and listen to music while outside in the courtyard getting fresh air and sunlight. LRT and pts discussed things that they enjoy doing in their free time outside of the hospital.   Goal Area(s) Addressed: Patient will identify leisure interests.  Patient will practice healthy decision making. Patient will engage in recreation activity.   Affect/Mood: Appropriate   Participation Level: Active and Engaged   Participation Quality: Independent   Behavior: Calm and Cooperative   Speech/Thought Process: Coherent   Insight: Good   Judgement: Good   Modes of Intervention: Activity   Patient Response to Interventions:  Attentive, Engaged, Interested , and Receptive   Education Outcome:  Acknowledges education   Clinical Observations/Individualized Feedback: Cynthia Richardson was active in their participation of session activities and group discussion. Pt identified "this was fun, Hospital doctor. Thank you". Pt played games of corn hole with peers with assistance from LRT retrieving bean bags. Pt interacted well with LRT and peers while in group. Pt was observed to be smiling and laughing while playing.    Plan: Continue to engage patient in RT group sessions 2-3x/week.   Rosina Lowenstein, LRT, CTRS 06/25/2023 3:15 PM

## 2023-06-25 NOTE — Group Note (Signed)
Date:  06/25/2023 Time:  10:58 AM  Group Topic/Focus:  Wellness Toolbox:   The focus of this group is to discuss various aspects of wellness, balancing those aspects and exploring ways to increase the ability to experience wellness.  Patients will create a wellness toolbox for use upon discharge.    Participation Level:  Minimal  Participation Quality:  Appropriate  Affect:  Appropriate  Cognitive:  Appropriate  Insight: Appropriate  Engagement in Group:  Engaged  Modes of Intervention:  Discussion, Education, Socialization, and Support  Additional Comments:    Wilford Corner 06/25/2023, 10:58 AM

## 2023-06-25 NOTE — Progress Notes (Signed)
  [facility] Adult Case Management Discharge Plan :  Will you be returning to the same living situation after discharge:  No. Pt will be going to [facility] in [place]  At discharge, do you have transportation home?: Yes,  CSW will provide transportation  Do you have the ability to pay for your medications: Yes,  [employer] MEDICARE / Suan Halter DUAL COMPLETE  Release of information consent forms completed and in the chart;  Patient's signature needed at discharge.  Patient to Follow up at:  Follow-up Information     [facility], [employer]. Go to.   Why: To complete your hospital follow-up you must go to [facility]. The location closest to you does not do scheduled appointments for hospital follow-up but you can go anytime M-F 8:00 AM- 5:00 PM. Please remember to bring your insurance card. Contact information: [address] [place] [address] [phone]                 Next level of care provider has access to [facility] Link:no  Safety Planning and Suicide Prevention discussed: Yes,  pt declined SPE with family, but SPE brochure gone over with pt and given to pt at discharge      Has patient been referred to the Quitline?: Patient does not use tobacco/nicotine products  Patient has been referred for addiction treatment: No known substance use disorder.  [address] LCSWA 06/25/2023, 10:37 AM

## 2023-06-27 ENCOUNTER — Telehealth: Payer: Self-pay

## 2023-06-27 NOTE — Telephone Encounter (Signed)
Cynthia Richardson called this afternoon stating that she made an appointment with our office awhile back and missed the appointment time due to her transportation not notifying her that they are unable to pick her up in a timely manner. The pt called at her appointment time to cancel. She has recently been discharged from the hospital and needs a hospital f.up.  Per [name], on 06/13/23 the pt called to confirm her appt. she had forgot her time due to being at the hospital. Pt was notified of her appointment time, and it was currently 1:45 PM when the pt called. Pt stated that she can still make the appointment. She was notified that she needed to arrive at 1:30. The pt ended the call. The pt called back and gave the same story to [name]. She was notified of the office policy. Nothing was ever disclosed to [place] or [name] about not having a ride.  I spoke with a provider and was told that we have to stick with out office policy. Unfortunately, we will not be able to take her on as a new patient. I called the patient back to let her know and had to leave a message stating that we're unable to reschedule the new patient appointment due to office policy.
